# Patient Record
Sex: Female | Born: 1940 | Race: White | Hispanic: No | Marital: Married | Smoking: Former smoker
Health system: Southern US, Community
[De-identification: ages and names within clinical notes are randomized; demographics above are authoritative.]

## PROBLEM LIST (undated history)

## (undated) DIAGNOSIS — R51 Headache: Secondary | ICD-10-CM

## (undated) DIAGNOSIS — K52832 Lymphocytic colitis: Secondary | ICD-10-CM

## (undated) DIAGNOSIS — M549 Dorsalgia, unspecified: Secondary | ICD-10-CM

## (undated) DIAGNOSIS — I10 Essential (primary) hypertension: Secondary | ICD-10-CM

## (undated) DIAGNOSIS — R002 Palpitations: Secondary | ICD-10-CM

## (undated) DIAGNOSIS — J45909 Unspecified asthma, uncomplicated: Secondary | ICD-10-CM

## (undated) DIAGNOSIS — R413 Other amnesia: Secondary | ICD-10-CM

## (undated) DIAGNOSIS — K5790 Diverticulosis of intestine, part unspecified, without perforation or abscess without bleeding: Secondary | ICD-10-CM

## (undated) DIAGNOSIS — J189 Pneumonia, unspecified organism: Secondary | ICD-10-CM

## (undated) DIAGNOSIS — IMO0001 Reserved for inherently not codable concepts without codable children: Secondary | ICD-10-CM

## (undated) DIAGNOSIS — I639 Cerebral infarction, unspecified: Secondary | ICD-10-CM

## (undated) DIAGNOSIS — M8580 Other specified disorders of bone density and structure, unspecified site: Secondary | ICD-10-CM

## (undated) DIAGNOSIS — H353122 Nonexudative age-related macular degeneration, left eye, intermediate dry stage: Secondary | ICD-10-CM

## (undated) DIAGNOSIS — G459 Transient cerebral ischemic attack, unspecified: Secondary | ICD-10-CM

## (undated) DIAGNOSIS — N3090 Cystitis, unspecified without hematuria: Secondary | ICD-10-CM

## (undated) DIAGNOSIS — R0600 Dyspnea, unspecified: Secondary | ICD-10-CM

## (undated) DIAGNOSIS — C50919 Malignant neoplasm of unspecified site of unspecified female breast: Secondary | ICD-10-CM

## (undated) DIAGNOSIS — R011 Cardiac murmur, unspecified: Secondary | ICD-10-CM

## (undated) DIAGNOSIS — R896 Abnormal cytological findings in specimens from other organs, systems and tissues: Secondary | ICD-10-CM

## (undated) DIAGNOSIS — J449 Chronic obstructive pulmonary disease, unspecified: Secondary | ICD-10-CM

## (undated) DIAGNOSIS — Z86010 Personal history of colonic polyps: Secondary | ICD-10-CM

## (undated) DIAGNOSIS — N952 Postmenopausal atrophic vaginitis: Secondary | ICD-10-CM

## (undated) DIAGNOSIS — K219 Gastro-esophageal reflux disease without esophagitis: Secondary | ICD-10-CM

## (undated) DIAGNOSIS — M1990 Unspecified osteoarthritis, unspecified site: Secondary | ICD-10-CM

## (undated) DIAGNOSIS — Z853 Personal history of malignant neoplasm of breast: Secondary | ICD-10-CM

## (undated) HISTORY — PX: CARDIAC CATHETERIZATION: SHX172

## (undated) HISTORY — DX: Personal history of colonic polyps: Z86.010

## (undated) HISTORY — DX: Reserved for inherently not codable concepts without codable children: IMO0001

## (undated) HISTORY — PX: TONSILLECTOMY: SUR1361

## (undated) HISTORY — PX: FACIAL COSMETIC SURGERY: SHX629

## (undated) HISTORY — PX: CATARACT EXTRACTION: SUR2

## (undated) HISTORY — DX: Other specified disorders of bone density and structure, unspecified site: M85.80

## (undated) HISTORY — DX: Abnormal cytological findings in specimens from other organs, systems and tissues: R89.6

## (undated) HISTORY — DX: Palpitations: R00.2

## (undated) HISTORY — DX: Postmenopausal atrophic vaginitis: N95.2

## (undated) HISTORY — DX: Transient cerebral ischemic attack, unspecified: G45.9

## (undated) HISTORY — DX: Malignant neoplasm of unspecified site of unspecified female breast: C50.919

## (undated) HISTORY — DX: Unspecified osteoarthritis, unspecified site: M19.90

## (undated) HISTORY — PX: BREAST LUMPECTOMY: SHX2

## (undated) HISTORY — DX: Cystitis, unspecified without hematuria: N30.90

## (undated) HISTORY — DX: Diverticulosis of intestine, part unspecified, without perforation or abscess without bleeding: K57.90

## (undated) HISTORY — DX: Lymphocytic colitis: K52.832

## (undated) HISTORY — DX: Dorsalgia, unspecified: M54.9

## (undated) HISTORY — PX: EYE SURGERY: SHX253

## (undated) HISTORY — PX: COLPOSCOPY: SHX161

## (undated) HISTORY — DX: Headache: R51

## (undated) HISTORY — PX: BREAST BIOPSY: SHX20

## (undated) HISTORY — DX: Personal history of malignant neoplasm of breast: Z85.3

---

## 1990-02-19 DIAGNOSIS — C50919 Malignant neoplasm of unspecified site of unspecified female breast: Secondary | ICD-10-CM

## 1990-02-19 HISTORY — DX: Malignant neoplasm of unspecified site of unspecified female breast: C50.919

## 1991-02-20 HISTORY — PX: LYMPHADENECTOMY: SHX15

## 1997-10-21 ENCOUNTER — Other Ambulatory Visit: Admission: RE | Admit: 1997-10-21 | Discharge: 1997-10-21 | Payer: Self-pay | Admitting: Obstetrics and Gynecology

## 1998-11-22 ENCOUNTER — Other Ambulatory Visit: Admission: RE | Admit: 1998-11-22 | Discharge: 1998-11-22 | Payer: Self-pay | Admitting: Obstetrics and Gynecology

## 1999-01-25 ENCOUNTER — Emergency Department (HOSPITAL_COMMUNITY): Admission: EM | Admit: 1999-01-25 | Discharge: 1999-01-26 | Payer: Self-pay | Admitting: Emergency Medicine

## 1999-01-31 ENCOUNTER — Emergency Department (HOSPITAL_COMMUNITY): Admission: EM | Admit: 1999-01-31 | Discharge: 1999-01-31 | Payer: Self-pay | Admitting: Emergency Medicine

## 1999-11-30 ENCOUNTER — Other Ambulatory Visit: Admission: RE | Admit: 1999-11-30 | Discharge: 1999-11-30 | Payer: Self-pay | Admitting: Obstetrics and Gynecology

## 2000-12-18 ENCOUNTER — Other Ambulatory Visit: Admission: RE | Admit: 2000-12-18 | Discharge: 2000-12-18 | Payer: Self-pay | Admitting: Obstetrics and Gynecology

## 2002-01-19 ENCOUNTER — Other Ambulatory Visit: Admission: RE | Admit: 2002-01-19 | Discharge: 2002-01-19 | Payer: Self-pay | Admitting: Obstetrics and Gynecology

## 2002-05-27 ENCOUNTER — Encounter: Payer: Self-pay | Admitting: Family Medicine

## 2002-05-27 ENCOUNTER — Ambulatory Visit: Admission: RE | Admit: 2002-05-27 | Discharge: 2002-05-27 | Payer: Self-pay | Admitting: Family Medicine

## 2002-07-07 ENCOUNTER — Encounter: Payer: Self-pay | Admitting: Gastroenterology

## 2002-07-07 ENCOUNTER — Encounter (INDEPENDENT_AMBULATORY_CARE_PROVIDER_SITE_OTHER): Payer: Self-pay | Admitting: Specialist

## 2002-07-07 ENCOUNTER — Ambulatory Visit (HOSPITAL_COMMUNITY): Admission: RE | Admit: 2002-07-07 | Discharge: 2002-07-07 | Payer: Self-pay | Admitting: Gastroenterology

## 2002-07-07 DIAGNOSIS — Z860100 Personal history of colon polyps, unspecified: Secondary | ICD-10-CM

## 2002-07-07 HISTORY — DX: Personal history of colonic polyps: Z86.010

## 2002-07-07 HISTORY — DX: Personal history of colon polyps, unspecified: Z86.0100

## 2002-07-08 ENCOUNTER — Encounter: Payer: Self-pay | Admitting: Gastroenterology

## 2002-07-08 ENCOUNTER — Ambulatory Visit (HOSPITAL_COMMUNITY): Admission: RE | Admit: 2002-07-08 | Discharge: 2002-07-08 | Payer: Self-pay | Admitting: Gastroenterology

## 2003-03-17 ENCOUNTER — Other Ambulatory Visit: Admission: RE | Admit: 2003-03-17 | Discharge: 2003-03-17 | Payer: Self-pay | Admitting: Obstetrics and Gynecology

## 2003-06-01 ENCOUNTER — Ambulatory Visit (HOSPITAL_COMMUNITY): Admission: RE | Admit: 2003-06-01 | Discharge: 2003-06-01 | Payer: Self-pay | Admitting: Family Medicine

## 2004-05-23 ENCOUNTER — Other Ambulatory Visit: Admission: RE | Admit: 2004-05-23 | Discharge: 2004-05-23 | Payer: Self-pay | Admitting: Addiction Medicine

## 2004-06-21 ENCOUNTER — Inpatient Hospital Stay (HOSPITAL_BASED_OUTPATIENT_CLINIC_OR_DEPARTMENT_OTHER): Admission: RE | Admit: 2004-06-21 | Discharge: 2004-06-21 | Payer: Self-pay | Admitting: Cardiology

## 2005-05-29 ENCOUNTER — Other Ambulatory Visit: Admission: RE | Admit: 2005-05-29 | Discharge: 2005-05-29 | Payer: Self-pay | Admitting: Obstetrics and Gynecology

## 2005-09-21 ENCOUNTER — Other Ambulatory Visit: Admission: RE | Admit: 2005-09-21 | Discharge: 2005-09-21 | Payer: Self-pay | Admitting: Obstetrics and Gynecology

## 2006-01-22 ENCOUNTER — Other Ambulatory Visit: Admission: RE | Admit: 2006-01-22 | Discharge: 2006-01-22 | Payer: Self-pay | Admitting: Obstetrics and Gynecology

## 2006-06-03 ENCOUNTER — Other Ambulatory Visit: Admission: RE | Admit: 2006-06-03 | Discharge: 2006-06-03 | Payer: Self-pay | Admitting: Obstetrics and Gynecology

## 2006-11-26 ENCOUNTER — Other Ambulatory Visit: Admission: RE | Admit: 2006-11-26 | Discharge: 2006-11-26 | Payer: Self-pay | Admitting: Obstetrics and Gynecology

## 2007-06-27 ENCOUNTER — Other Ambulatory Visit: Admission: RE | Admit: 2007-06-27 | Discharge: 2007-06-27 | Payer: Self-pay | Admitting: Obstetrics and Gynecology

## 2007-12-13 ENCOUNTER — Encounter: Admission: RE | Admit: 2007-12-13 | Discharge: 2007-12-13 | Payer: Self-pay | Admitting: Neurology

## 2008-02-20 DIAGNOSIS — I639 Cerebral infarction, unspecified: Secondary | ICD-10-CM

## 2008-02-20 HISTORY — DX: Cerebral infarction, unspecified: I63.9

## 2008-06-15 ENCOUNTER — Ambulatory Visit (HOSPITAL_COMMUNITY): Admission: RE | Admit: 2008-06-15 | Discharge: 2008-06-15 | Payer: Self-pay | Admitting: Neurology

## 2008-07-02 ENCOUNTER — Emergency Department (HOSPITAL_COMMUNITY): Admission: EM | Admit: 2008-07-02 | Discharge: 2008-07-02 | Payer: Self-pay | Admitting: Emergency Medicine

## 2008-07-13 ENCOUNTER — Ambulatory Visit (HOSPITAL_COMMUNITY): Admission: RE | Admit: 2008-07-13 | Discharge: 2008-07-13 | Payer: Self-pay | Admitting: Cardiology

## 2008-07-20 ENCOUNTER — Ambulatory Visit: Payer: Self-pay | Admitting: Cardiovascular Disease

## 2008-07-20 ENCOUNTER — Ambulatory Visit (HOSPITAL_COMMUNITY): Admission: RE | Admit: 2008-07-20 | Discharge: 2008-07-20 | Payer: Self-pay | Admitting: Cardiology

## 2008-07-23 ENCOUNTER — Encounter: Payer: Self-pay | Admitting: Cardiology

## 2008-08-03 ENCOUNTER — Ambulatory Visit: Payer: Self-pay | Admitting: Surgery

## 2008-08-03 ENCOUNTER — Ambulatory Visit: Admission: RE | Admit: 2008-08-03 | Discharge: 2008-08-03 | Payer: Self-pay | Admitting: Neurology

## 2008-08-03 ENCOUNTER — Encounter (INDEPENDENT_AMBULATORY_CARE_PROVIDER_SITE_OTHER): Payer: Self-pay | Admitting: Neurology

## 2008-08-17 ENCOUNTER — Encounter: Payer: Self-pay | Admitting: Obstetrics and Gynecology

## 2008-08-17 ENCOUNTER — Ambulatory Visit: Payer: Self-pay | Admitting: Obstetrics and Gynecology

## 2009-02-19 HISTORY — PX: COLONOSCOPY: SHX174

## 2009-08-24 ENCOUNTER — Other Ambulatory Visit: Admission: RE | Admit: 2009-08-24 | Discharge: 2009-08-24 | Payer: Self-pay | Admitting: Obstetrics and Gynecology

## 2009-08-24 ENCOUNTER — Ambulatory Visit: Payer: Self-pay | Admitting: Obstetrics and Gynecology

## 2009-09-20 ENCOUNTER — Ambulatory Visit: Payer: Self-pay | Admitting: Cardiology

## 2009-10-05 ENCOUNTER — Encounter: Payer: Self-pay | Admitting: Gastroenterology

## 2009-10-12 ENCOUNTER — Encounter: Payer: Self-pay | Admitting: Gastroenterology

## 2009-10-14 ENCOUNTER — Encounter: Payer: Self-pay | Admitting: Gastroenterology

## 2009-10-26 ENCOUNTER — Telehealth: Payer: Self-pay | Admitting: Gastroenterology

## 2009-10-28 DIAGNOSIS — K5900 Constipation, unspecified: Secondary | ICD-10-CM | POA: Insufficient documentation

## 2009-10-28 DIAGNOSIS — R51 Headache: Secondary | ICD-10-CM

## 2009-10-28 DIAGNOSIS — Z853 Personal history of malignant neoplasm of breast: Secondary | ICD-10-CM

## 2009-10-28 DIAGNOSIS — M899 Disorder of bone, unspecified: Secondary | ICD-10-CM | POA: Insufficient documentation

## 2009-10-28 DIAGNOSIS — G459 Transient cerebral ischemic attack, unspecified: Secondary | ICD-10-CM | POA: Insufficient documentation

## 2009-10-28 DIAGNOSIS — Z860100 Personal history of colon polyps, unspecified: Secondary | ICD-10-CM | POA: Insufficient documentation

## 2009-10-28 DIAGNOSIS — N3090 Cystitis, unspecified without hematuria: Secondary | ICD-10-CM | POA: Insufficient documentation

## 2009-10-28 DIAGNOSIS — M1990 Unspecified osteoarthritis, unspecified site: Secondary | ICD-10-CM | POA: Insufficient documentation

## 2009-10-28 DIAGNOSIS — M549 Dorsalgia, unspecified: Secondary | ICD-10-CM | POA: Insufficient documentation

## 2009-10-28 DIAGNOSIS — R1319 Other dysphagia: Secondary | ICD-10-CM

## 2009-10-28 DIAGNOSIS — R002 Palpitations: Secondary | ICD-10-CM

## 2009-10-28 DIAGNOSIS — M949 Disorder of cartilage, unspecified: Secondary | ICD-10-CM

## 2009-10-28 DIAGNOSIS — R519 Headache, unspecified: Secondary | ICD-10-CM | POA: Insufficient documentation

## 2009-11-01 ENCOUNTER — Ambulatory Visit: Payer: Self-pay | Admitting: Gastroenterology

## 2009-11-01 DIAGNOSIS — R197 Diarrhea, unspecified: Secondary | ICD-10-CM

## 2009-11-01 LAB — CONVERTED CEMR LAB
ALT: 26 units/L (ref 0–35)
AST: 27 units/L (ref 0–37)
Albumin: 4.4 g/dL (ref 3.5–5.2)
BUN: 12 mg/dL (ref 6–23)
Basophils Relative: 0.9 % (ref 0.0–3.0)
CO2: 30 meq/L (ref 19–32)
Chloride: 103 meq/L (ref 96–112)
Creatinine, Ser: 0.6 mg/dL (ref 0.4–1.2)
Eosinophils Absolute: 0.1 10*3/uL (ref 0.0–0.7)
Eosinophils Relative: 1.7 % (ref 0.0–5.0)
Glucose, Bld: 76 mg/dL (ref 70–99)
Hemoglobin: 14.3 g/dL (ref 12.0–15.0)
Lymphocytes Relative: 24.9 % (ref 12.0–46.0)
MCHC: 33.9 g/dL (ref 30.0–36.0)
Neutro Abs: 3.2 10*3/uL (ref 1.4–7.7)
RBC: 4.31 M/uL (ref 3.87–5.11)
Sed Rate: 13 mm/hr (ref 0–22)
TSH: 0.57 microintl units/mL (ref 0.35–5.50)
Total Protein: 6.9 g/dL (ref 6.0–8.3)
Vitamin B-12: 544 pg/mL (ref 211–911)
WBC: 5.3 10*3/uL (ref 4.5–10.5)

## 2009-11-03 ENCOUNTER — Encounter: Payer: Self-pay | Admitting: Gastroenterology

## 2009-11-11 ENCOUNTER — Telehealth: Payer: Self-pay | Admitting: Gastroenterology

## 2009-11-15 ENCOUNTER — Telehealth: Payer: Self-pay | Admitting: Gastroenterology

## 2009-11-15 ENCOUNTER — Encounter (INDEPENDENT_AMBULATORY_CARE_PROVIDER_SITE_OTHER): Payer: Self-pay | Admitting: *Deleted

## 2009-11-18 ENCOUNTER — Encounter (INDEPENDENT_AMBULATORY_CARE_PROVIDER_SITE_OTHER): Payer: Self-pay | Admitting: *Deleted

## 2009-11-22 ENCOUNTER — Ambulatory Visit: Payer: Self-pay | Admitting: Gastroenterology

## 2009-11-22 ENCOUNTER — Telehealth: Payer: Self-pay | Admitting: Gastroenterology

## 2009-12-05 ENCOUNTER — Ambulatory Visit: Payer: Self-pay | Admitting: Gastroenterology

## 2009-12-06 ENCOUNTER — Encounter: Payer: Self-pay | Admitting: Gastroenterology

## 2010-01-05 DIAGNOSIS — K5289 Other specified noninfective gastroenteritis and colitis: Secondary | ICD-10-CM

## 2010-01-10 ENCOUNTER — Ambulatory Visit: Payer: Self-pay | Admitting: Gastroenterology

## 2010-02-19 DIAGNOSIS — M8580 Other specified disorders of bone density and structure, unspecified site: Secondary | ICD-10-CM

## 2010-02-19 HISTORY — DX: Other specified disorders of bone density and structure, unspecified site: M85.80

## 2010-02-21 ENCOUNTER — Telehealth: Payer: Self-pay | Admitting: Gastroenterology

## 2010-02-24 ENCOUNTER — Telehealth: Payer: Self-pay | Admitting: Gastroenterology

## 2010-03-23 NOTE — Progress Notes (Signed)
Summary: MD Switch Request  Phone Note From Other Clinic Call back at [phone]   Caller: Gloria--Dr Perini Call For: Dr Arlyce Dice Reason for Call: Schedule Patient Appt Summary of Call: Patient wants to switch from Dr Arlyce Dice to Dr Jarold Motto because her husband is a pt of Dr Jarold Motto and she does not want to see a PA pt wants to see a doctor. Initial call taken by: Tawni Levy,  October 26, 2009 9:29 AM  Follow-up for Phone Call        DR.KAPLAN-Do you approve switch? Laureen Ochs LPN  October 26, 2009 9:44 AM   Additional Follow-up for Phone Call Additional follow up Details #1::        ok Additional Follow-up by: Louis Meckel MD,  October 26, 2009 11:06 AM    Additional Follow-up for Phone Call Additional follow up Details #2::    DR.PATTERSON--Will you accept this patient? Laureen Ochs LPN  October 26, 2009 11:10 AM   Additional Follow-up for Phone Call Additional follow up Details #3:: Details for Additional Follow-up Action Taken: NOT APPROPRIATE PER OUR RECENT GUIDELINES... Additional Follow-up by: [name] MD Clementeen Graham,  October 26, 2009 11:39 AM   Appended Document: MD Switch Request Justice Rocher that Dr.Patterson declines pt., she may see Amy Esterwood PAC this week or see Dr.Kaplan 11-14-09 at 11:15am. Will wait for [facility] or pt. to callback to confirm an appointment.    Appended Document: MD Switch Request per Dr. Jarold Motto ok to change...patient is wife to Dr. Sharia Reeve.  Appended Document: MD Switch Request Pt. has an appt. w/Dr.Patterson on 11-01-09 at 10:45am.

## 2010-03-23 NOTE — Miscellaneous (Signed)
Summary: [facility]  Clinical Lists Changes  Medications: Added new medication of MOVIPREP 100 GM  SOLR (PEG-KCL-NACL-NASULF-NA ASC-C) As per prep instructions. - Signed Rx of MOVIPREP 100 GM  SOLR (PEG-KCL-NACL-NASULF-NA ASC-C) As per prep instructions.;  #1 x 0;  Signed;  Entered by: Ezra Sites RN;  Authorized by: [name] MD [facility];  Method used: Electronically to [facility] [id number]*, [address], [place], [place], [place]  [address], Ph: [phone] or [phone], Fax: [phone] Observations: Added new observation of ALLERGY REV: Done (11/22/2009 14:35)    Prescriptions: MOVIPREP 100 GM  SOLR (PEG-KCL-NACL-NASULF-NA ASC-C) As per prep instructions.  #1 x 0   Entered by:   Ezra Sites RN   Authorized by:   [name] MD [facility]   Signed by:   Ezra Sites RN on 11/22/2009   Method used:   Electronically to        [facility] [address] [phone]* (retail)       [address]       [place], [place]  [address]       Ph: [phone] or [phone]       Fax: [phone]   RxID:   [id number]

## 2010-03-23 NOTE — Procedures (Signed)
Summary: COLONOSCOPY   Colonoscopy  Procedure date:  08/07/2002  Findings:      Location:  [facility].     Patient Name: Anna York, Anna York MRN: [id number] Procedure Procedures: Colonoscopy CPT: 40981.    with Hot Biopsy(s)CPT: Z451292.  Personnel: Endoscopist: Barbette Hair. Arlyce Dice, MD.  Indications Symptoms: Change in bowel habits.  History  Pre-Exam Physical: Performed Jul 07, 2002. Entire physical exam was normal.  Exam Exam: Extent of exam reached: Cecum, extent intended: Cecum.  The cecum was identified by IC valve. Colon retroflexion performed. ASA Classification: I. Tolerance: good.  Monitoring: Pulse and BP monitoring, Oximetry used. Supplemental O2 given. at 2 Liters.  Colon Prep Used Golytely for colon prep. Prep results: excellent.  Sedation Meds: Fentanyl 100 mcg. given IV. Versed 10 mg. given IV.  Findings POLYP: Sigmoid Colon, Maximum size: 2 mm. Distance from Anus 20 cm. Procedure:  hot biopsy, Polyp sent to pathology. ICD9: Colon Polyps: 211.3.  POLYP: Sigmoid Colon, Maximum size: 2 mm. Distance from Anus 18 cm. Procedure:  hot biopsy, sent to pathology. ICD9: Colon Polyps: 211.3.   Assessment Abnormal examination, see findings above.  Diagnoses: 211.3: Colon Polyps.   Events  Unplanned Interventions: No intervention was required.  Unplanned Events: There were no complications. Plans Medication Plan: Fiber supplements: Bran 1 Tbsp QD, starting Jul 07, 2002   Patient Education: Patient given standard instructions for: Polyps.  Scheduling/Referral: Office Visit, to [employer]. Arlyce Dice, MD, around Jul 28, 2002.  CT Scan, Abd and pelvis, around Jul 08, 2002.  Blood Tests, CBC, CMET, on Jul 07, 2002.    CC: [name]  This report was created from the original endoscopy report, which was reviewed and signed by the above listed endoscopist.

## 2010-03-23 NOTE — Letter (Signed)
Summary: [facility]   Imported By: Sherian Rein 11/03/2009 14:58:22  _____________________________________________________________________  External Attachment:    Type:   Image     Comment:   External Document

## 2010-03-23 NOTE — Letter (Signed)
Summary: [facility] Instructions  [facility]  [address] [place], [place] [address]   Phone: [phone]  Fax: [phone]       Anna York    05-Feb-1941    MRN: [id number]        Procedure Day /Date:  Monday 12/19/2009     Arrival Time: 12:30 pm      Procedure Time: 1:30 pm     Location of Procedure:                    _ x_  [facility] (4th Floor)                        PREPARATION FOR COLONOSCOPY WITH MOVIPREP   Starting 5 days prior to your procedure 10/26 Wednesday do not eat nuts, seeds, popcorn, corn, beans, peas,  salads, or any raw vegetables.  Do not take any fiber supplements (e.g. Metamucil, Citrucel, and Benefiber).  THE DAY BEFORE YOUR PROCEDURE         DATE: Sunday 10/30  1.  Drink clear liquids the entire day-NO SOLID FOOD  2.  Do not drink anything colored red or purple.  Avoid juices with pulp.  No orange juice.  3.  Drink at least 64 oz. (8 glasses) of fluid/clear liquids during the day to prevent dehydration and help the prep work efficiently.  CLEAR LIQUIDS INCLUDE: Water Jello Ice Popsicles Tea (sugar ok, no milk/cream) Powdered fruit flavored drinks Coffee (sugar ok, no milk/cream) Gatorade Juice: apple, white grape, white cranberry  Lemonade Clear bullion, consomm, broth Carbonated beverages (any kind) Strained chicken noodle soup Hard Candy                             4.  In the morning, mix first dose of MoviPrep solution:    Empty 1 Pouch A and 1 Pouch B into the disposable container    Add lukewarm drinking water to the top line of the container. Mix to dissolve    Refrigerate (mixed solution should be used within 24 hrs)  5.  Begin drinking the prep at 5:00 p.m. The MoviPrep container is divided by 4 marks.   Every 15 minutes drink the solution down to the next mark (approximately 8 oz) until the full liter is complete.   6.  Follow completed prep with 16 oz of clear liquid of your choice  (Nothing red or purple).  Continue to drink clear liquids until bedtime.  7.  Before going to bed, mix second dose of MoviPrep solution:    Empty 1 Pouch A and 1 Pouch B into the disposable container    Add lukewarm drinking water to the top line of the container. Mix to dissolve    Refrigerate  THE DAY OF YOUR PROCEDURE      DATE: Monday 10/31  Beginning at 8:30 a.m. (5 hours before procedure):         1. Every 15 minutes, drink the solution down to the next mark (approx 8 oz) until the full liter is complete.  2. Follow completed prep with 16 oz. of clear liquid of your choice.    3. You may drink clear liquids until 11:30 am (2 HOURS BEFORE PROCEDURE).   MEDICATION INSTRUCTIONS  Unless otherwise instructed, you should take regular prescription medications with a small sip of water   as early as possible the morning of  your procedure.         OTHER INSTRUCTIONS  You will need a responsible adult at least 69 years of age to accompany you and drive you home.   This person must remain in the waiting room during your procedure.  Wear loose fitting clothing that is easily removed.  Leave jewelry and other valuables at home.  However, you may wish to bring a book to read or  an iPod/MP3 player to listen to music as you wait for your procedure to start.  Remove all body piercing jewelry and leave at home.  Total time from sign-in until discharge is approximately 2-3 hours.  You should go home directly after your procedure and rest.  You can resume normal activities the  day after your procedure.  The day of your procedure you should not:   Drive   Make legal decisions   Operate machinery   Drink alcohol   Return to work  You will receive specific instructions about eating, activities and medications before you leave.    The above instructions have been reviewed and explained to me by   Ezra Sites RN  November 22, 2009 3:13 PM    I fully understand and  can verbalize these instructions _____________________________ Date _________

## 2010-03-23 NOTE — Progress Notes (Signed)
Summary: Diarrhea  Phone Note Call from Patient   Call For: Dr Jarold Motto Reason for Call: Talk to Nurse Summary of Call: Wants to talk to triage nurse about Diarrhea she has been having for weeks. Initial call taken by: Leanor Kail [facility],  November 22, 2009 3:28 PM  Follow-up for Phone Call        librax is not helping with diarrhea , but is helping with the pain.  She would like to move her colon up if possible.  Patient  is rescheduled to 12/05/09 9:00. Follow-up by: Darcey Nora RN, CGRN,  November 22, 2009 3:55 PM

## 2010-03-23 NOTE — Assessment & Plan Note (Signed)
Summary: lower abdominal pain X 1 month/lk     (MD switch approved 9-7...   History of Present Illness Visit Type: consult Primary GI MD: Sheryn Bison MD FACP FAGA Requesting Provider: Rodrigo Ran, MD Chief Complaint: lower abdominal pain that will radiate toward chest History of Present Illness:   Very Pleasant 70 year old Caucasian female wife of Dr. Garrison Columbus. She is seen today because of a 3 week history of crampy lower abdominal pain with small volume nonbloody diarrhea. She denies systemic complaints, fever, chills, upper gastrointestinal, or hepatobiliary problems. She has not had stool examinations performed.  Her past medical history is remarkable for constipation predominant IBS and she had a negative colonoscopy in 2004 with Dr. Melvia Heaps. She has had previous breast cancer with lumpectomy, chemotherapy and radiation therapy but no prior abdominal surgery. She did have CT scan of her abdomen in 2004 which was unremarkable except for small left lung nodule that apparently was followed by Dr. Durwin Nora.  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 Her husband is not sick, and she's had no known infectious disease exposure. She does have degenerative arthritis and takes Celebrex and p.r.n. NSAIDs. She well Levaquin for cystitis in the end of June. She apparently has not been on other antibiotics.   GI Review of Systems    Reports abdominal pain and  bloating.      Denies acid reflux, belching, chest pain, dysphagia with liquids, dysphagia with solids, heartburn, loss of appetite, nausea, vomiting, vomiting blood, weight loss, and  weight gain.      Reports diarrhea.     Denies anal fissure, black tarry stools, change in bowel habit, constipation, diverticulosis, fecal incontinence, heme positive stool, hemorrhoids, irritable bowel syndrome, jaundice, light color stool, liver problems, rectal bleeding, and  rectal pain.     Current Medications (verified): 1)  Fosamax 70 Mg Tabs (Alendronate Sodium) .... Take One By Mouth Once A Week 2)  Toprol Xl 25 Mg Xr24h-Tab (Metoprolol Succinate) .... Take 1/2 Tablet By Mouth Once Daily 3)  Plavix 75 Mg Tabs (Clopidogrel Bisulfate) .... Take One By Mouth Once Daily 4)  Replens  Gel (Vaginal Lubricant) .... Use As Directed 5)  Celebrex 200 Mg Caps (Celecoxib) .... Take One By Mouth Once Daily 6)  Womens Multivitamin Plus  Tabs (Multiple Vitamins-Minerals) .... Take One By Mouth Once Daily 7)  Prevacid 30 Mg Cpdr (Lansoprazole) .... Take One Cap By Mouth As Needed With Celebrex  Allergies (verified): 1)  ! Macrodantin 2)  ! Erythromycin  Past History:  Past medical, surgical, family and social histories (including risk factors) reviewed for relevance to current acute and chronic problems.  Past Medical History: Reviewed history from 10/28/2009 and no changes required. Current Problems:  OTHER DYSPHAGIA (ICD-787.29) TIA (ICD-435.9) PALPITATIONS (ICD-785.1) OSTEOARTHRITIS (ICD-715.90) CYSTITIS (ICD-595.9) OSTEOPENIA (ICD-733.90) HEADACHE (ICD-784.0) BACK PAIN (ICD-724.5) COLONIC POLYPS, HYPERPLASTIC, HX OF (ICD-V12.72) NEOPLASM, MALIGNANT, BREAST, HX OF (ICD-V10.3) CONSTIPATION (ICD-564.00)  Past Surgical History: lumpectomy, chemo and radiation Tonsillectomy  Family History: Reviewed history from 10/28/2009 and no changes required. No FH of Colon Cancer: Family History of Breast Cancer: Mother- dx'd 73 yo  Social History: Reviewed history from 10/28/2009 and no changes required. Occupation: Veterinary surgeon at [employer]  Married to Dr. Sharia Reeve Alcohol Use - yes 4 glasses per day Illicit Drug Use - no Patient is a former smoker.  Daily Caffeine Use 2 per day Patient gets regular exercise. 3 times a week  Review of Systems       The patient complains of arthritis/joint pain and fatigue.  The patient denies allergy/sinus, anemia,  anxiety-new, back pain, blood in urine, breast changes/lumps, confusion, cough, coughing up blood, depression-new, fainting, fever, headaches-new, hearing problems, heart murmur, heart rhythm changes, itching, menstrual pain, muscle pains/cramps, night sweats, nosebleeds, pregnancy symptoms, shortness of breath, skin rash, sleeping problems, sore throat, swelling of feet/legs, swollen lymph glands, thirst - excessive, urination - excessive, urination changes/pain, urine leakage, vision changes, and voice change.    Vital Signs:  Patient profile:   70 year old female Height:      65 inches Weight:      128 pounds BMI:     21.38 Pulse rate:   76 / minute Pulse rhythm:   regular BP sitting:   110 / 74  (left arm) Cuff size:   regular  Vitals Entered By: Francee Piccolo CMA Duncan Dull) (November 01, 2009 10:51 AM)  Physical Exam  General:  Well developed, well nourished, no acute distress.healthy appearing.   Head:  Normocephalic and atraumatic. Eyes:  PERRLA, no icterus.exam deferred to patient's ophthalmologist.   Lungs:  decreased BS on L and decreased BS on R.   Heart:  Regular rate and rhythm; no murmurs, rubs,  or bruits. Abdomen:  Soft, nontender and nondistended. No masses, hepatosplenomegaly or hernias noted. Normal bowel sounds. Rectal:  Normal exam.hemocult negative.   Msk:  arthritic changes.   Extremities:  No clubbing, cyanosis, edema or deformities noted. Neurologic:  Alert and  oriented x4;  grossly normal neurologically. Cervical Nodes:  No significant cervical adenopathy. Psych:  Alert and cooperative. Normal mood and affect.   Impression & Recommendations:  Problem # 1:  DIARRHEA (ICD-787.91) Assessment Unchanged Probable antibiotic associated diarrhea and mild C. difficile colitis. I have requested stool exams and screening labs. We have started fidaxomicin 200mg  two times a day for 10 days and daily Florstar probiotic therapy. She is to call in one week's time for  progress report. The patient did not want a prescription for metronidazole because of continued EtOH use. She may need followup colonoscopy exam depending on her workup and clinical course.Review of her records shows a prescription for metronidazole 500 mg t.i.d. by Dr. Kennedy Bucker in August, but I am not sure this patient took this prescription. Orders: TLB-CBC Platelet - w/Differential (85025-CBCD) TLB-BMP (Basic Metabolic Panel-BMET) (80048-METABOL) TLB-Hepatic/Liver Function Pnl (80076-HEPATIC) TLB-TSH (Thyroid Stimulating Hormone) (84443-TSH) TLB-B12, Serum-Total ONLY (09811-B14) TLB-Ferritin (82728-FER) TLB-Folic Acid (Folate) (82746-FOL) TLB-IBC Pnl (Iron/FE;Transferrin) (83550-IBC) TLB-Sedimentation Rate (ESR) (85652-ESR) T-Culture, C-Diff Toxin A/B (78295-62130) T-Culture, Stool (87045/87046-70140) T-Stool for O&P (86578-46962) T-Fecal WBC (95284-13244)  Problem # 2:  PALPITATIONS (ICD-785.1) Assessment: Improved Continue other medications per Dr. Waynard Edwards...  Patient Instructions: 1)  Copy sent to : Rodrigo Ran, MD 2)  Your prescription(s) have been sent to you pharmacy.  3)  Please go to the basement today for your labs.  4)  The medication list was reviewed and reconciled.  All changed / newly prescribed medications were explained.  A complete medication list was provided to the patient / caregiver. 5)  Please call our GI Office back in 2 weeks with a follow-up of symptoms. 6)  Advised to stick with a low residue diet  avoiding food that can irritate bowel (see handout).  7)  Please return stool exams as requested. 8)  Avoidance of sorbitol/fructose. Prescriptions: FLORASTOR 250 MG CAPS (SACCHAROMYCES BOULARDII) take one by mouth once daily for 10days  #10 x 0  Entered by:   [name] CMA (AAMA)   Authorized by:   [name] MD [facility]   Signed by:   [name] CMA (AAMA) on 11/01/2009   Method used:   Electronically to        [facility] [address] [phone]*  (retail)       [address]       [place], [place]  [address]       Ph: [phone] or [phone]       Fax: [phone]   RxID:   [id number] DIFICID 200 MG TABS (FIDAXOMICIN) take one by mouth two times a day for 10 days  #20 x 0   Entered by:   [name] CMA (AAMA)   Authorized by:   [name] MD [facility]   Signed by:   [name] CMA (AAMA) on 11/01/2009   Method used:   Electronically to        [facility] [address] [phone]* (retail)       [address]       [place], [place]  [address]       Ph: [phone] or [phone]       Fax: [phone]   RxID:   [id number]   Appended Document: lower abdominal pain X 1 month/lk     (MD switch approved 9-7... Left a message on patients machine to call back if she has questions but her rx has been changed and sent.   Clinical Lists Changes  Medications: Changed medication from PREVACID 30 MG CPDR (LANSOPRAZOLE) take one cap by mouth as needed with Celebrex to OMEPRAZOLE 20 MG CPDR (OMEPRAZOLE) take one by mouth once daily - Signed Rx of OMEPRAZOLE 20 MG CPDR (OMEPRAZOLE) take one by mouth once daily;  #30 x 11;  Signed;  Entered by: [name] CMA (AAMA);  Authorized by: [name] MD [facility];  Method used: Electronically to [facility] [id number]*, [address], [place], [place], [place]  [address], Ph: [phone] or [phone], Fax: [phone]    Prescriptions: OMEPRAZOLE 20 MG CPDR (OMEPRAZOLE) take one by mouth once daily  #30 x 11   Entered by:   [name] CMA (AAMA)   Authorized by:   [name] MD [facility]   Signed by:   [name] CMA (AAMA) on 11/01/2009   Method used:   Electronically to        [facility] [address] [phone]* (retail)       [address]       [place], [place]  [address]       Ph: [phone] or [phone]       Fax: [phone]   RxID:    [id number]    Appended Document: lower abdominal pain X 1 month/lk     (MD switch approved 9-7... per Dr. Jarold Motto we will hold the vanco until after stool cultures are back.    Clinical Lists Changes  Medications: Changed medication from DIFICID 200 MG TABS (FIDAXOMICIN) take one by mouth two times a day for 10 days to [facility] HCL 125 MG CAPS (VANCOMYCIN HCL) take one by mouth three times a day for 10 days - Signed Rx of VANCOCIN HCL 125 MG CAPS (VANCOMYCIN HCL) take one by mouth three times a day  for 10 days;  #30 x 0;  Signed;  Entered by: [name] CMA (AAMA);  Authorized by: [name] MD [facility];  Method used: Telephoned to [facility] [id number]*, [address] [place], [place], [place]  [address], Ph: [phone] or [phone], Fax: [phone]    Prescriptions: VANCOCIN HCL 125 MG CAPS (VANCOMYCIN HCL) take one by mouth three times a day for 10 days  #30 x 0   Entered by:   [name] CMA (AAMA)   Authorized by:   [name] MD [facility]   Signed by:   [name] CMA (AAMA) on 11/01/2009   Method used:   Telephoned to ...       [facility]  [address] [phone]* (retail)       [address]       [place], [place]  [address]       Ph: [phone] or [phone]       Fax: [phone]   RxID:   [id number]    Appended Document: lower abdominal pain X 1 month/lk     (MD switch approved 9-7... please call...stop prevacid and use librax q8h as needed...  Appended Document: lower abdominal pain X 1 month/lk     (MD switch approved 9-7... Left a message on patients machine to call back. patietn has already been changed from prevacid to omeprazole per Dr Jarold Motto.   Appended Document: lower abdominal pain X 1 month/lk     (MD switch approved 9-7... pt aware, rx sent   Clinical Lists Changes  Medications: Removed medication of VANCOCIN HCL 125 MG CAPS (VANCOMYCIN HCL) take one by mouth three times a  day for 10 days Removed medication of FLORASTOR 250 MG CAPS (SACCHAROMYCES BOULARDII) take one by mouth once daily for 10days Added new medication of LIBRAX 2.5-5 MG CAPS (CLIDINIUM-CHLORDIAZEPOXIDE) take one by mouth every 8 hours as needed - Signed Rx of LIBRAX 2.5-5 MG CAPS (CLIDINIUM-CHLORDIAZEPOXIDE) take one by mouth every 8 hours as needed;  #90 x 2;  Signed;  Entered by: [name] CMA (AAMA);  Authorized by: [name] MD [facility];  Method used: Electronically to [facility] [id number]*, [address] [place], [place], [place]  [address], Ph: [phone] or [phone], Fax: [phone]    Prescriptions: LIBRAX 2.5-5 MG CAPS (CLIDINIUM-CHLORDIAZEPOXIDE) take one by mouth every 8 hours as needed  #90 x 2   Entered by:   [name] CMA (AAMA)   Authorized by:   [name] MD [facility]   Signed by:   [name] CMA (AAMA) on 11/03/2009   Method used:   Electronically to        [facility] [address] [phone]* (retail)       [address]       [place], [place]  [address]       Ph: [phone] or [phone]       Fax: [phone]   RxID:   [id number]

## 2010-03-23 NOTE — Progress Notes (Signed)
Summary: Triage-Sooner Appt.  Phone Note From Other Clinic Call back at [phone]   Caller: Malachi Bonds, scheduler Call For: Dr. Arlyce Dice Reason for Call: Schedule Patient Appt Summary of Call: Dr. Waynard Edwards would like pt sch'ed asap for severe diarrhea Initial call taken by: Vallarie Mare,  October 26, 2009 8:58 AM  Follow-up for Phone Call        Pt. will see [facility] on 10-27-09 at 1:30pm. Malachi Bonds will advise pt. of appt/med.list/co-pay/cx.policy. She will fax records to [facility].  Follow-up by: Laureen Ochs LPN,  October 26, 2009 9:17 AM

## 2010-03-23 NOTE — Letter (Signed)
Summary: [facility]   Imported By: Sherian Rein 11/03/2009 11:07:25  _____________________________________________________________________  External Attachment:    Type:   Image     Comment:   External Document

## 2010-03-23 NOTE — Letter (Signed)
Summary: Patient Notice- Colon Biospy Results  [facility]  [address] [place], [place] [address]   Phone: [phone]  Fax: [phone]        December 06, 2009 MRN: [id number]    Anna York [address] [place], [place]  [address]    Dear [name],  I am pleased to inform you that the biopsies taken during your recent colonoscopy did not show any evidence of cancer upon pathologic examination.  Additional information/recommendations:  __No further action is needed at this time.  Please follow-up with      your primary care physician for your other healthcare needs.  __Please call [phone] to schedule a return visit to review      your condition.  x__Continue with the treatment plan as outlined on the day of your      exam.Biopsies confirm lymphocytic colitis which would explain all of your diarrhea problems. We will start Entocort 9 mg a day. This is a topical steroid preparation which has excellent efficacy in this condition. Also my nurse will schedule you for office followup in one month's time. I hope you are feeling better soon.  __You should have a repeat colonoscopy examination for this problem           in _ years.  Please call us if you are having persistent problems or have questions about your condition that have not been fully answered at this time.  Sincerely,  [name] MD [facility]   This letter has been electronically signed by your physician.  Appended Document: Patient Notice- Colon Biospy Results letter mailed

## 2010-03-23 NOTE — Progress Notes (Signed)
Summary: speak to nurse  Phone Note Call from Patient Call back at Home Phone [phone] Call back at cell [phone]   Caller: Patient Call For: Dr Jarold Motto Reason for Call: Talk to Nurse Summary of Call: Patient was told to call back in six weeks to f/u up on meds Initial call taken by: Tawni Levy,  February 21, 2010 4:14 PM  Follow-up for Phone Call        Left a message on patients machine to call back. [name] CMA Duncan Dull)  February 21, 2010 5:14 PM  patient states sh feels about the same since starting the Lialda. She has a little less diarrhea but is still having some urgency. She is having some abdominal cramping at night still. She is still using a half of Imoddium as needed. no bleeding and she is no worse. She states that the Entocort helped her a little more but not enough to want to change back also it is to expensive.  Follow-up by: [name] CMA Duncan Dull),  February 22, 2010 9:16 AM    Additional Follow-up for Phone Call Additional follow up Details #2::    ok.... Follow-up by: [name] MD [facility],  February 22, 2010 1:03 PM

## 2010-03-23 NOTE — Progress Notes (Signed)
Summary: Schedule Colonoscopy  Phone Note Outgoing Call Call back at [facility] Phone [phone]   Call placed by: [name] CMA Duncan Dull),  November 15, 2009 3:00 PM Call placed to: Patient Summary of Call: per Dr Jarold Motto patient needs set up for a direct colonoscopy. I have left a message with her husband that she can call back to schedule it when the time is good for her. He will have her call back, i gave him the number.  Initial call taken by: [name] CMA Duncan Dull),  November 15, 2009 3:01 PM     Appended Document: Schedule Colonoscopy Per Dr. Jarold Motto patient can continue her plavix she does not need to stop for her colonoscopy  Appended Document: Schedule Colonoscopy pt. returned call and sch'd direct COL on 12-19-09 @ 1:30

## 2010-03-23 NOTE — Assessment & Plan Note (Signed)
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 She was placed on Entocort 9 mg a day which she took for several weeks but apparently discontinued because of expense. She is proximally 75% improved on therapy. Off of treatment she continues to have periodic diarrhea and lower bowel cramping. She denies NSAID use but does take Celebrex 200 mg a day.Review the medication she is omeprazole 20 mg a day for reasons which are unclear all reviewing her chart.   GI Review of Systems    Reports abdominal pain.      Denies acid reflux, belching, bloating, chest pain, dysphagia with liquids, dysphagia with solids, heartburn, loss of appetite, nausea, vomiting, vomiting blood, weight loss, and  weight gain.        Denies anal fissure, black tarry stools, change in bowel habit, constipation, diarrhea, diverticulosis, fecal incontinence, heme positive stool, hemorrhoids, irritable bowel syndrome, jaundice, light color stool, liver problems, rectal bleeding, and  rectal pain.    Current Medications (verified): 1)  Fosamax 70 Mg Tabs (Alendronate Sodium) .... Take One By Mouth Once A Week 2)  Toprol Xl 25 Mg Xr24h-Tab (Metoprolol Succinate) .... Take 1/2 Tablet By Mouth Once Daily 3)  Plavix 75 Mg Tabs (Clopidogrel Bisulfate) .... Take One By Mouth Once Daily 4)  Replens  Gel (Vaginal Lubricant) .... Use As Directed 5)  Celebrex 200 Mg Caps (Celecoxib) .... Take One By Mouth Once Daily 6)  Womens Multivitamin Plus  Tabs (Multiple Vitamins-Minerals) .... Take One By Mouth Once Daily 7)  Omeprazole 20 Mg Cpdr (Omeprazole) ....  Take One By Mouth Once Daily 8)  Entocort Ec 3 Mg Xr24h-Cap (Budesonide) .... Take Three By Mouth Once Daily  Allergies (verified): 1)  ! Macrodantin 2)  ! Erythromycin  Past History:  Past medical, surgical, family and social histories (including risk factors) reviewed for relevance to current acute and chronic problems.  Past Medical History: Reviewed history from 10/28/2009 and no changes required. 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 lumpectomy, chemo and radiation Tonsillectomy  Family History: Reviewed history from 11/01/2009 and no changes required. No FH of Colon Cancer: Family History of Breast Cancer: Mother- dx'd 73 yo  Social History: Reviewed history from 11/01/2009 and no changes required. Occupation: Veterinary surgeon at [employer]  Married to Dr. Sharia Reeve Alcohol Use - yes 4 glasses per day Illicit Drug Use - no Patient is a former smoker.  Daily Caffeine Use 2 per day Patient gets regular exercise. 3 times a week  Review of Systems       The patient complains of arthritis/joint pain.  The patient denies allergy/sinus, anemia, anxiety-new, back pain, blood in urine, breast changes/lumps, change in vision, confusion, cough, coughing up blood, depression-new, fainting, fatigue, fever, headaches-new, hearing problems, heart murmur, heart rhythm changes, itching, menstrual pain, muscle pains/cramps, night sweats, nosebleeds, pregnancy symptoms, shortness of breath, skin rash, sleeping problems, sore throat, swelling of feet/legs, swollen lymph glands, thirst -  excessive , urination -  excessive , urination changes/pain, urine leakage, vision changes, and voice change.    Vital Signs:  Patient profile:   70 year old female Height:      65 inches Weight:      128 pounds BMI:     21.38 Pulse rate:   68 / minute Pulse rhythm:   regular BP sitting:   130 / 68  (right arm) Cuff size:   regular  Vitals Entered By: June McMurray CMA Duncan Dull) (January 10, 2010 2:36 PM)  Physical Exam  General:  Well developed, well nourished, no acute distress. Head:  Normocephalic and atraumatic. Eyes:  PERRLA, no icterus.exam deferred to patient's ophthalmologist.   Psych:  Alert and cooperative. Normal mood and affect.   Impression & Recommendations:  Problem # 1:  COLITIS (ICD-558.9) Assessment Improved Lymphocytic colitis improved on therapy but treatment hindered by cost of Entocort . Therefore I have given her samples of Lialda 2.4 g a day to use, and to call us with a progress report in several weeks' time. She may need low-dose prednisone therapy for colitis controlled. I have asked her to stop her omeprazole therapy. There had been reports of response to Pepto-Bismol, but this requires 2 tablets 4 times a day which limits its effectiveness as a primary therapy.  Problem # 2:  NEOPLASM, MALIGNANT, BREAST, HX OF (ICD-V10.3) Assessment: Comment Only  Problem # 3:  OSTEOARTHRITIS (ICD-715.90) Assessment: Improved Continue Celebrex as tolerated and avoid regular NSAIDs. Lymphocytic colitis has been associated with nonsteroidal anti-inflammatory drug use.  Patient Instructions: 1)  Copy sent to : Rodrigo Ran, MD 2)  Stop Entocort and start Lialda two by mouth every morning. 3)  Call back in one month to update your symptoms.  4)  The medication list was reviewed and reconciled.  All changed / newly prescribed medications were explained.  A complete medication list was provided to the patient / caregiver. 5)  Stop Omeprazole 6)  Please call our GI Office back in 2 weeks with a  follow-up of symptoms. Prescriptions: LIALDA 1.2 GM TBEC (MESALAMINE) take 2 by mouth every morning  #60 x 1   Entered by:   [name] CMA (AAMA)   Authorized by:   [name] MD [facility]   Signed by:   [name] CMA (AAMA) on 01/10/2010   Method used:   Electronically to        [facility] [phone]* (retail)       [address]       [place], [place]  [address]       Ph: [phone] or [phone]       Fax: [phone]   RxID:   [id number]

## 2010-03-23 NOTE — Progress Notes (Signed)
Summary: Prescription runs out today.  Phone Note Call from Patient Call back at Home Phone [phone]   Call For: Dr Jarold Motto Summary of Call: Has not heard back from nurse and her prescription runs out today. Initial call taken by: Leanor Kail [facility],  February 24, 2010 9:58 AM  Follow-up for Phone Call        rx for lialda sent and pt aware.  Follow-up by: [name] CMA (AAMA),  February 24, 2010 10:04 AM    Prescriptions: LIALDA 1.2 GM TBEC (MESALAMINE) take 2 by mouth every morning  #60 x 6   Entered by:   [name] CMA (AAMA)   Authorized by:   [name] MD [facility]   Signed by:   [name] CMA (AAMA) on 02/24/2010   Method used:   Electronically to        [facility] [phone]* (retail)       [address]       [place], [place]  [address]       Ph: [phone] or [phone]       Fax: [phone]   RxID:   [id number]

## 2010-03-23 NOTE — Letter (Signed)
Summary: Pre Visit Letter Revised  [facility]  [address] [place], [place] [address]   Phone: [phone]  Fax: [phone]        11/15/2009 MRN: [id number] Anna York [address] [employer] [place], [place]  [address]             Procedure Date:  12-19-09   Welcome to the Gastroenterology Division at [facility].    You are scheduled to see a nurse for your pre-procedure visit on 11-22-09 at 2:30p.m. on the 3rd floor at [facility], [address]  We ask that you try to arrive at our office 15 minutes prior to your appointment time to allow for check-in.  Please take a minute to review the attached form.  If you answer "Yes" to one or more of the questions on the first page, we ask that you call the person listed at your earliest opportunity.  If you answer "No" to all of the questions, please complete the rest of the form and bring it to your appointment.    Your nurse visit will consist of discussing your medical and surgical history, your immediate family medical history, and your medications.   If you are unable to list all of your medications on the form, please bring the medication bottles to your appointment and we will list them.  We will need to be aware of both prescribed and over the counter drugs.  We will need to know exact dosage information as well.    Please be prepared to read and sign documents such as consent forms, a financial agreement, and acknowledgement forms.  If necessary, and with your consent, a friend or relative is welcome to sit-in on the nurse visit with you.  Please bring your insurance card so that we may make a copy of it.  If your insurance requires a referral to see a specialist, please bring your referral form from your primary care physician.  No co-pay is required for this nurse visit.     If you cannot keep your appointment, please call [phone] to cancel or reschedule prior to your appointment date.  This  allows [place] the opportunity to schedule an appointment for another patient in need of care.    Thank you for choosing [facility] for your medical needs.  We appreciate the opportunity to care for you.  Please visit [place] at our website  to learn more about our practice.  Sincerely, The Gastroenterology Division

## 2010-03-23 NOTE — Procedures (Signed)
Summary: Colonoscopy  Patient: Anna York Note: All result statuses are Final unless otherwise noted.  Tests: (1) Colonoscopy (COL)   COL Colonoscopy           DONE      [facility]     [address]     [place], [place]  [address]           COLONOSCOPY PROCEDURE REPORT           PATIENT:  Anna York, Anna York  MR#:  [id number]     BIRTHDATE:  1940/05/11, 69 yrs. old  GENDER:  female     ENDOSCOPIST:  Vania Rea. Jarold Motto, MD, [facility]     REF. BY:     PROCEDURE DATE:  12/05/2009     PROCEDURE:  Colonoscopy with biopsy     ASA CLASS:  Class II     INDICATIONS:  unexplained diarrhea     MEDICATIONS:   Fentanyl 100 mcg IV, Versed 10 mg IV           DESCRIPTION OF PROCEDURE:   After the risks benefits and     alternatives of the procedure were thoroughly explained, informed     consent was obtained.  Digital rectal exam was performed and     revealed no abnormalities.   The LB 180AL [id number] endoscope was     introduced through the anus and advanced to the cecum, which was     identified by both the appendix and ileocecal valve, limited by a     redundant colon.  very difficult exam per tiortuosity and     reduundancy.had to extubate and reuse the pediatric colonoscope to     complete the exam.  The quality of the prep was excellent, using     MoviPrep.  The instrument was then slowly withdrawn as the colon     was fully examined.     <<PROCEDUREIMAGES>>           FINDINGS:  There were mild diverticular changes in left colon.     diverticulosis was found.  No polyps or cancers were seen.  This     was otherwise a normal examination of the colon. double random     biopsies every 10 cm done.   Retroflexed views in the rectum     revealed no abnormalities.    The scope was then withdrawn from     the patient and the procedure completed.           COMPLICATIONS:  None     ENDOSCOPIC IMPRESSION:     1) Diverticulosis,mild,left sided diverticulosis     2) No polyps or  cancers     3) Otherwise normal examination     R/O MICROSCOPIC/COLLAGENOUS COLITIS.     RECOMMENDATIONS:     1) Await biopsy results     MAY NEED LOTRONEX TRIAL.     REPEAT EXAM:  No           ______________________________     Vania Rea. Jarold Motto, MD, [facility]           CC:  Rodrigo Ran, MD           n.     Rosalie DoctorMarland Kitchen   Vania Rea. Patterson at 12/05/2009 09:44 AM           Derek [place], [phone]  Note: An exclamation mark (!) indicates a result that was not dispersed into the flowsheet. Document Creation Date: 12/05/2009 9:45 AM _______________________________________________________________________  (1)  Order result status: Final Collection or observation date-time: 12/05/2009 09:36 Requested date-time:  Receipt date-time:  Reported date-time:  Referring Physician:   Ordering Physician: Sheryn Bison [phone]) Specimen Source:  Source: Launa Grill Order Number: [id number] Lab site:

## 2010-03-23 NOTE — Progress Notes (Signed)
Summary: Lab results  Phone Note Call from Patient Call back at [phone]   Caller: Patient Call For: Dr. Jarold Motto Reason for Call: Lab or Test Results Summary of Call: calling about Lab results Initial call taken by: Karna Christmas,  November 11, 2009 2:49 PM  Follow-up for Phone Call        Told stool studies negative.Has only used Immodium x1 since ov.If  loose stools increase will call back. Follow-up by: Teryl Lucy RN,  November 11, 2009 4:40 PM

## 2010-05-01 ENCOUNTER — Other Ambulatory Visit: Payer: Self-pay | Admitting: Radiology

## 2010-05-04 ENCOUNTER — Telehealth: Payer: Self-pay | Admitting: Physician Assistant

## 2010-05-09 NOTE — Progress Notes (Addendum)
Summary: ? re meds  Phone Note Call from Patient Call back at Home Phone [phone]   Caller: Patient Call For: Dr Jarold Motto Reason for Call: Talk to Nurse Summary of Call: Patient wants to speak to nurse regarding her meds. (lialda) Initial call taken by: Tawni Levy,  May 04, 2010 2:38 PM  Follow-up for Phone Call        Lmom at patient's work number for her to call back. Graciella Freer RN  May 04, 2010 3:32 PM   Patient reports she is doing better, but she still has a slight stomach ache some mornings and urgency. She has tried decreasing the Lialda to 1 tab daily, but her diarrhea and pain increases.  She was originally on Entocort- 01/10/10 note- but it was too expensive so she went on Lialda. 02/21/10 note stated she's the same as today. She thinks she then as now that she did better with the Entocort, but she hasn't reached her do nut hole for meds. Chanc Kervin PAC, do you have any suggestions? Thanks. Graciella Freer RN  May 04, 2010 4:09 PM   Additional Follow-up for Phone Call Additional follow up Details #1::        SHE SHOULD STAY ON THE DOSE OF LIALDA DR. PATTERSON WANTED HER TO BE ON-THEN HE NEEDS TO DECIDE ABOUT ENTOCORT OR STEROIDS WHEN HE GETS BACK Additional Follow-up by: Sammuel Cooper PA-c,  May 05, 2010 10:00 AM     Appended Document: ? re meds LMOM for patient at work to call back.  Appended Document: ? re meds Notified patient per Mike Gip PAC , please remain on the dose Dr Jarold Motto wanted her to be on until he comes back on Monday. Dr Jarold Motto can decide about the ENTOCORT OR Steroids. Patient stated understanding.

## 2010-05-12 ENCOUNTER — Telehealth: Payer: Self-pay | Admitting: *Deleted

## 2010-05-12 NOTE — Telephone Encounter (Signed)
Per 05/09/10 note, per Dr Jarold Motto, pt should remain on Lialda 1.2mg  2 tabs daily until her do nut hole is met and we can try the Entocort again. LMOM stating this and that I left samples at the front desk, since she ran out.  LOT [id number]    EXP 03/2012    24 tabs.

## 2010-05-31 LAB — CREATININE, SERUM
Creatinine, Ser: 0.68 mg/dL (ref 0.4–1.2)
GFR calc Af Amer: >60 mL/min (ref >60)

## 2010-07-07 NOTE — Cardiovascular Report (Signed)
NAME:  ANGELICIA, LESSNER           ACCOUNT NO.:  [id number]   MEDICAL RECORD NO.:  [id number]          PATIENT TYPE:  OIB   LOCATION:  6501                         FACILITY:  [facility]   PHYSICIAN:  Colleen Can. Deborah Chalk, M.D.DATE OF BIRTH:  1940-10-01   DATE OF PROCEDURE:  06/21/2004  DATE OF DISCHARGE:                              CARDIAC CATHETERIZATION   HISTORY:  Ms. Susann Givens is referred for catheterization after having exercise-  induced ventricular tachycardia and ventricular ectopy.  She has a history  of mild scoliosis and COPD noted by chest x-ray.   PROCEDURE:  1.  Left heart catheterization with selective coronary angiography.  2.  Left ventricular angiography.   TYPE AND SITE OF ENTRY:  Percutaneous right femoral artery.   CATHETERS:  4-French Judkins right and left coronary catheters (4 curved), 4-  French pigtail ventriculographic catheter.   CONTRAST MATERIAL:  Omnipaque.   MEDICATIONS GIVEN PRIOR TO PROCEDURE:  Valium 10 mg p.o.   MEDICATIONS GIVEN DURING TO PROCEDURE:  Versed 2 mg IV.   COMMENTS:  Patient tolerated the procedure well.   HEMODYNAMIC DATA:  The aortic pressure is 136/80.  LV was 168/11-19.  There  is no aortic valve gradient noted on pullback, however.   ANGIOGRAPHIC DATA:  1.  Right coronary artery is normal.  It is dominant.  2.  Left main coronary artery is normal.  3.  Left anterior descending is normal.  4.  Left circumflex is normal.   Left ventricular angiogram was performed in the RAO position.  Overall  cardiac size and silhouette are normal.  Global ejection fraction is 60%.   OVERALL IMPRESSION:  1.  Normal left ventricular function.  2.  Normal coronary arteries.   DISCUSSION:  In light of the exercise-induced arrhythmia will initiate a  trial of beta blockers, try to see follow-up study to make sure we do have  abolition of her arrhythmia.      SNT/MEDQ  D:  06/21/2004  T:  06/21/2004  Job:  [id number]

## 2010-07-07 NOTE — H&P (Signed)
NAME:  Anna York, STMARIE           ACCOUNT NO.:  [id number]   MEDICAL RECORD NO.:  [id number]          PATIENT TYPE:  OIB   LOCATION:  NA                           FACILITY:  [facility]   PHYSICIAN:  Colleen Can. Deborah Chalk, M.D.DATE OF BIRTH:  1940-07-01   DATE OF ADMISSION:  DATE OF DISCHARGE:                                HISTORY & PHYSICAL   CHIEF COMPLAINT:  Palpitations.   HISTORY OF PRESENT ILLNESS:  The patient is a 70 year old white female.  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  She presented to our office for consultation toward the latter part  of March 2006.  Overall, she has basically enjoyed good health; however, has  begun to have an increased awareness of irregular heart rate as well as a  prominence of the heartbeat that has been lasting for less than 3-5 minutes.  She does not really describe it as a racing type sensation but more of a  hard, forceful sensation.  There has been no associated lightheadedness.  She has actually had no chest pain; however, she does report some degree of  indigestion.  She has had chronic shortness of breath.  She was subsequently  referred for a stress echocardiogram as well as having an event monitor  placed.  Her event monitor has basically been unremarkable with satisfactory  tracings.  She underwent a stress echocardiogram on 06/19/04.  With that, she  was exercised on the standard Bruce protocol.  She exercised for a total of  10 minutes to a maximum heart rate of 176.  Blood pressure rose to 170/80.  The EKG was negative for ischemia; however, the echocardiographic images  were abnormal with reduction of LV function as well as an increase noted in  PVCs.  She is now referred for elective cardiac catheterization.   PAST MEDICAL HISTORY:  1.  Palpitations.  2.  History of lumpectomy in 1992 with previous chemotherapy and radiation      for breast cancer.  3.  Remote history of pneumonia.  4.   Mild scoliosis.  5.  COPD.  6.  Remote tobacco abuse.  7.  Childbirth x1.   ALLERGIES:  ERYTHROMYCIN.   CURRENT MEDICATIONS:  Evista.   FAMILY HISTORY:  Father died at 42 of hypertension and kidney disease.  Mother died at the age of 26 of stroke and asthma.  She has no siblings.   SOCIAL HISTORY:  She lives at home alone.  She is divorced.  She has not  smoked for three years, but she does use alcohol on a daily basis which  involves 2-3 glasses of wine.   REVIEW OF SYSTEMS:  Basically as noted above and is otherwise unremarkable.   PHYSICAL EXAMINATION:  GENERAL:  She is a very pleasant, somewhat anxious  white female in no acute distress.  VITAL SIGNS:  Weight 130 pounds, blood pressure 130/80, heart rate 84 and  regular, respirations 18, she is afebrile.  SKIN:  Warm and dry, color is unremarkable.  LUNGS:  Basically clear.  CARDIAC:  Fairly regular rhythm and rate.  There is no murmur.  ABDOMEN:  Soft with positive bowel sounds, nontender.  EXTREMITIES:  Without edema.  NEUROLOGIC:  Intact.  There are no gross focal deficits.   LABORATORIES:  PT and PTT are unremarkable.  Chemistries are normal.  CBC is  normal.   IMPRESSION:  1.  Abnormal stress echocardiogram.  2.  Palpitations.  3.  Alcohol use.  4.  Remote history of tobacco abuse with subsequent chronic obstructive      pulmonary disease.   PLAN:  Will proceed on with cardiac catheterization.  The procedure has been  discussed in full detail and she is willing to proceed on Wednesday, Jun 21, 2004.      LC/MEDQ  D:  06/20/2004  T:  06/20/2004  Job:  [id number]

## 2010-08-29 ENCOUNTER — Other Ambulatory Visit (HOSPITAL_COMMUNITY)
Admission: RE | Admit: 2010-08-29 | Discharge: 2010-08-29 | Disposition: A | Discharge status: Home / Self Care | Payer: Medicare Other | Source: Ambulatory Visit | Attending: Obstetrics and Gynecology | Admitting: Obstetrics and Gynecology

## 2010-08-29 ENCOUNTER — Telehealth: Payer: Self-pay | Admitting: Gastroenterology

## 2010-08-29 ENCOUNTER — Encounter (INDEPENDENT_AMBULATORY_CARE_PROVIDER_SITE_OTHER): Payer: Medicare Other | Admitting: Obstetrics and Gynecology

## 2010-08-29 ENCOUNTER — Other Ambulatory Visit: Payer: Self-pay | Admitting: Obstetrics and Gynecology

## 2010-08-29 DIAGNOSIS — Z124 Encounter for screening for malignant neoplasm of cervix: Secondary | ICD-10-CM | POA: Insufficient documentation

## 2010-08-29 DIAGNOSIS — N951 Menopausal and female climacteric states: Secondary | ICD-10-CM

## 2010-08-29 DIAGNOSIS — R87610 Atypical squamous cells of undetermined significance on cytologic smear of cervix (ASC-US): Secondary | ICD-10-CM

## 2010-08-29 DIAGNOSIS — R82998 Other abnormal findings in urine: Secondary | ICD-10-CM

## 2010-08-29 DIAGNOSIS — N952 Postmenopausal atrophic vaginitis: Secondary | ICD-10-CM

## 2010-08-29 NOTE — Telephone Encounter (Signed)
Patient has a few questions for Dr Jarold Motto.  She wants to know how long she should have to be treated for lymphocytic colitis? Lialda controls the symptoms, but she wants to know if there is an alternative to this besides Entocort.  She states her husband theora vankirk and you discussed other alternatives.(generic meslamine????)  She is asking if you can call her?  She does not want to wait until 09/19/10 for the next available REV.  Dr Jarold Motto please advise

## 2010-08-29 NOTE — Telephone Encounter (Signed)
Left message for patient to call back  

## 2010-08-30 NOTE — Telephone Encounter (Signed)
Patient is scheduled for REV 09/19/10 2:30 with Dr Jarold Motto.

## 2010-08-30 NOTE — Telephone Encounter (Signed)
I have reviewed this problem with her on multiple occasions. She refuses to buy her medications, and her management has been very frustrating. I will see her office visit as scheduled.

## 2010-09-19 ENCOUNTER — Other Ambulatory Visit (INDEPENDENT_AMBULATORY_CARE_PROVIDER_SITE_OTHER): Payer: Medicare Other | Admitting: Gastroenterology

## 2010-09-19 ENCOUNTER — Ambulatory Visit (INDEPENDENT_AMBULATORY_CARE_PROVIDER_SITE_OTHER): Payer: Medicare Other | Admitting: Gastroenterology

## 2010-09-19 ENCOUNTER — Encounter: Payer: Self-pay | Admitting: Gastroenterology

## 2010-09-19 ENCOUNTER — Other Ambulatory Visit: Payer: Medicare Other

## 2010-09-19 VITALS — BP 134/68 | HR 100 | Ht 65.5 in | Wt 127.8 lb

## 2010-09-19 DIAGNOSIS — K5289 Other specified noninfective gastroenteritis and colitis: Secondary | ICD-10-CM

## 2010-09-19 DIAGNOSIS — Z0000 Encounter for general adult medical examination without abnormal findings: Secondary | ICD-10-CM

## 2010-09-19 DIAGNOSIS — K52832 Lymphocytic colitis: Secondary | ICD-10-CM

## 2010-09-19 LAB — URINALYSIS, ROUTINE W REFLEX MICROSCOPIC
Bilirubin Urine: NEGATIVE
Hgb urine dipstick: NEGATIVE
Ketones, ur: NEGATIVE
Nitrite: NEGATIVE
Specific Gravity, Urine: 1.025
Total Protein, Urine: NEGATIVE
Urine Glucose: NEGATIVE
Urobilinogen, UA: 0.2
pH: 5.5 (ref 5.0–8.0)

## 2010-09-19 MED ORDER — MESALAMINE ER 500 MG PO CPCR: 1000.0000 mg | ORAL_CAPSULE | Freq: Two times a day (BID) | ORAL | Status: DC

## 2010-09-19 NOTE — Progress Notes (Signed)
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 She currently is asymptomatic him more and comes in mostly today to talk about her illness and its etiology and treatment. She is having regular bowel movements without abdominal cramping, other gastrointestinal or systemic complaints. Her appetite is good her weight is stable and she denies any specific food intolerances, upper GI or hepatobiliary problems. She has had problems with expenses her medications, and most to take the least expensive medication available to control her illness. She has refused previous attempts to try Entocort therapy.    Current Medications, Allergies, Past Medical History, Past Surgical History, Family History and Social History were reviewed in [employer] record.   Assessment and plan: Lymphocytic colitis perhaps related to years of Celebrex therapy. She denies current instead use. She appears to be in good remission on Lialda 2.4 g a day. If she can get a better deal on Pentasa, we will prescribe 500 mg 2 tablets twice a day for maintenance therapy. I do not think followup colonoscopy her other labs indicated at this time besides celiac profile. Also will check urinalysis to exclude interstitial nephritis. Encounter Diagnosis  Name Primary?  . Lymphocytic colitis Yes

## 2010-09-19 NOTE — Patient Instructions (Signed)
Please go to the basement today for your labs.  Your Pentasa rx was printed and given to you so you can price compare against the Lialda.

## 2010-09-20 ENCOUNTER — Telehealth: Payer: Self-pay | Admitting: *Deleted

## 2010-09-20 DIAGNOSIS — N390 Urinary tract infection, site not specified: Secondary | ICD-10-CM

## 2010-09-20 LAB — CELIAC PANEL 10
Endomysial Screen: NEGATIVE
IgA: 187 mg/dL (ref 69–380)

## 2010-09-20 NOTE — Telephone Encounter (Signed)
Notified pt she needs to come in for a C&S of urine; she will try to come in this afternoon.

## 2010-09-20 NOTE — Telephone Encounter (Signed)
Pt aware.

## 2010-09-20 NOTE — Telephone Encounter (Signed)
Message copied by Florene Glen on Wed Sep 20, 2010  8:42 AM ------      Message from: Jarold Motto, DAVID R      Created: Wed Sep 20, 2010  8:30 AM       Needs CCU for culture

## 2010-09-20 NOTE — Telephone Encounter (Signed)
Message copied by Leonette Monarch on Wed Sep 20, 2010  3:29 PM ------      Message from: Jarold Motto, DAVID R      Created: Wed Sep 20, 2010  3:24 PM       NEGATIVE EXAM...PLEASE CALL HER.

## 2010-09-21 ENCOUNTER — Other Ambulatory Visit: Payer: Medicare Other

## 2010-09-21 DIAGNOSIS — N390 Urinary tract infection, site not specified: Secondary | ICD-10-CM

## 2010-09-23 LAB — URINE CULTURE: Colony Count: >=100000

## 2010-10-24 ENCOUNTER — Telehealth: Payer: Self-pay | Admitting: Internal Medicine

## 2010-10-24 NOTE — Telephone Encounter (Signed)
Pt aware UA culture is neg no rx needed

## 2010-10-31 ENCOUNTER — Other Ambulatory Visit: Payer: Self-pay | Admitting: *Deleted

## 2010-10-31 DIAGNOSIS — N6310 Unspecified lump in the right breast, unspecified quadrant: Secondary | ICD-10-CM

## 2010-11-13 ENCOUNTER — Other Ambulatory Visit: Payer: Self-pay | Admitting: Obstetrics and Gynecology

## 2010-11-13 DIAGNOSIS — N6310 Unspecified lump in the right breast, unspecified quadrant: Secondary | ICD-10-CM

## 2011-02-26 ENCOUNTER — Encounter: Payer: Self-pay | Admitting: Gynecology

## 2011-02-26 DIAGNOSIS — C801 Malignant (primary) neoplasm, unspecified: Secondary | ICD-10-CM | POA: Insufficient documentation

## 2011-02-26 DIAGNOSIS — N952 Postmenopausal atrophic vaginitis: Secondary | ICD-10-CM | POA: Insufficient documentation

## 2011-03-02 ENCOUNTER — Other Ambulatory Visit (HOSPITAL_COMMUNITY)
Admission: RE | Admit: 2011-03-02 | Discharge: 2011-03-02 | Disposition: A | Discharge status: Home / Self Care | Payer: Medicare Other | Source: Ambulatory Visit | Attending: Obstetrics and Gynecology | Admitting: Obstetrics and Gynecology

## 2011-03-02 ENCOUNTER — Ambulatory Visit (INDEPENDENT_AMBULATORY_CARE_PROVIDER_SITE_OTHER): Payer: Medicare Other | Admitting: Obstetrics and Gynecology

## 2011-03-02 VITALS — BP 140/90

## 2011-03-02 DIAGNOSIS — R87610 Atypical squamous cells of undetermined significance on cytologic smear of cervix (ASC-US): Secondary | ICD-10-CM

## 2011-03-02 DIAGNOSIS — Z01419 Encounter for gynecological examination (general) (routine) without abnormal findings: Secondary | ICD-10-CM | POA: Insufficient documentation

## 2011-03-02 NOTE — Progress Notes (Signed)
Patient came back today for further followup of an abnormal Pap smear. She is now had abnormal Pap smears off and on since 2007. Initially high-risk HPV was detected. Colposcopy and biopsy failed to reveal CIN. Patient's Paps have reverted to normal. However the  Pap smears 6 months ago she had ascus without high-risk HPV. Her last Pap smear done in July of 2012 was normal. For significant period of time she use Vagifem but after a TIA both her neurologist and her PCP wanted her to stop it. She is now using replens.  Pelvic exam: External: Within normal limits. BUS within normal limits. Vaginal exam: Within normal limits with some atrophic changes Cervix: Clean. Uterus: Within normal limits. Adnexa: Within normal limits. Rectovaginal exam: Within normal limits.   Assessment: #1. Ascus #2. Atrophic vaginitis  Plan: We will triage based on results of Pap smear. We will let her know results.if all normal she will return in 6 months.

## 2011-03-06 ENCOUNTER — Other Ambulatory Visit: Payer: Self-pay | Admitting: Gastroenterology

## 2011-05-08 ENCOUNTER — Encounter: Payer: Self-pay | Admitting: Obstetrics and Gynecology

## 2011-05-10 ENCOUNTER — Other Ambulatory Visit: Payer: Self-pay | Admitting: *Deleted

## 2011-05-10 ENCOUNTER — Encounter: Payer: Self-pay | Admitting: Obstetrics and Gynecology

## 2011-05-10 DIAGNOSIS — N6489 Other specified disorders of breast: Secondary | ICD-10-CM

## 2011-05-11 ENCOUNTER — Other Ambulatory Visit: Payer: Self-pay | Admitting: Obstetrics and Gynecology

## 2011-05-11 DIAGNOSIS — N6489 Other specified disorders of breast: Secondary | ICD-10-CM

## 2011-05-11 NOTE — Progress Notes (Signed)
Addended byValeda Malm L on: 05/11/2011 11:36 AM   Modules accepted: Orders

## 2011-09-20 ENCOUNTER — Encounter: Payer: Self-pay | Admitting: Obstetrics and Gynecology

## 2011-09-20 ENCOUNTER — Ambulatory Visit (INDEPENDENT_AMBULATORY_CARE_PROVIDER_SITE_OTHER): Payer: Medicare Other | Admitting: Obstetrics and Gynecology

## 2011-09-20 ENCOUNTER — Other Ambulatory Visit (HOSPITAL_COMMUNITY)
Admission: RE | Admit: 2011-09-20 | Discharge: 2011-09-20 | Disposition: A | Discharge status: Home / Self Care | Payer: Medicare Other | Source: Ambulatory Visit | Attending: Obstetrics and Gynecology | Admitting: Obstetrics and Gynecology

## 2011-09-20 VITALS — BP 124/80 | Ht 65.0 in | Wt 132.0 lb

## 2011-09-20 DIAGNOSIS — IMO0002 Reserved for concepts with insufficient information to code with codable children: Secondary | ICD-10-CM

## 2011-09-20 DIAGNOSIS — N390 Urinary tract infection, site not specified: Secondary | ICD-10-CM

## 2011-09-20 DIAGNOSIS — C50919 Malignant neoplasm of unspecified site of unspecified female breast: Secondary | ICD-10-CM

## 2011-09-20 DIAGNOSIS — M949 Disorder of cartilage, unspecified: Secondary | ICD-10-CM

## 2011-09-20 DIAGNOSIS — Z124 Encounter for screening for malignant neoplasm of cervix: Secondary | ICD-10-CM | POA: Insufficient documentation

## 2011-09-20 DIAGNOSIS — M8580 Other specified disorders of bone density and structure, unspecified site: Secondary | ICD-10-CM

## 2011-09-20 DIAGNOSIS — N952 Postmenopausal atrophic vaginitis: Secondary | ICD-10-CM

## 2011-09-20 DIAGNOSIS — R87610 Atypical squamous cells of undetermined significance on cytologic smear of cervix (ASC-US): Secondary | ICD-10-CM

## 2011-09-20 MED ORDER — LEVOFLOXACIN 500 MG PO TABS: 500.0000 mg | ORAL_TABLET | Freq: Every day | ORAL | Status: AC

## 2011-09-20 NOTE — Addendum Note (Signed)
Addended by: Dayna Barker on: 09/20/2011 03:41 PM   Modules accepted: Orders

## 2011-09-20 NOTE — Progress Notes (Signed)
Patient came to see me today for further followup. She continues to do well without recurrence of her breast cancer. She is up-to-date on mammograms. Her last bone density was 2012 and showed stability of her osteopenia on drug holiday from  Fosamax. She has been off Fosamax for approximately 15 months. She has had no fractures. She is having no vaginal bleeding. She is having no pelvic pain. She does have atrophic vaginitis which responds well to replens. She does get recurrent urinary tract infections and is allergic to Macrodantin and does well with Levaquin. She is currently having no dysuria, frequency or urgency of urination. She has never been formally diagnosed or treated for cervical dysplasia but over the past 7 years has had some abnormal Pap smears. In April, 2007 her Pap smear showed CIN-1 but colposcopy with biopsy failed to reveal dysplasia. In August of 2007 she had ascus on Pap with high-risk HPV not detected. She then had normal Pap smears for a while and in July, 2011 once again had ascus without high risk HPV detected. She is now had two  normal Paps in a row.  ROS: 12 system review done. Pertinent positives above. Other positives include microscopic colitis, TIA,back pain, dysphasia, headaches.  Physical examination: Kennon Portela present. HEENT within normal limits. Neck: Thyroid not large. No masses. Supraclavicular nodes: not enlarged. Breasts: Examined in both sitting and lying  position. No skin changes and no masses. Abdomen: Soft no guarding rebound or masses or hernia. Pelvic: External: Within normal limits. BUS: Within normal limits. Vaginal:within normal limits. Good estrogen effect. No evidence of cystocele rectocele or enterocele. Cervix: clean. Uterus: Normal size and shape. Adnexa: No masses. Rectovaginal exam: Confirmatory and negative. Extremities: Within normal limits.  Assessment: #1. Abnormal Pap smears #2. Atrophic vaginitis #3. Osteopenia on drug holiday #4.  Breast cancer #5. Recurrent urinary tract infections.  Plan: Continually mammograms. If this Pap smear is normal suggest followup Pap in 3 years.The new Pap smear guidelines were discussed with the patient. Prescription for Levaquin 500 mg #3 with one refill to use if the patient gets UTI when she's traveling. Bone density 2014. Continue Replens.

## 2011-09-26 ENCOUNTER — Encounter: Payer: Self-pay | Admitting: Obstetrics and Gynecology

## 2011-10-03 ENCOUNTER — Encounter: Payer: Self-pay | Admitting: *Deleted

## 2011-10-04 ENCOUNTER — Ambulatory Visit (INDEPENDENT_AMBULATORY_CARE_PROVIDER_SITE_OTHER): Payer: Medicare Other | Admitting: Emergency Medicine

## 2011-10-04 ENCOUNTER — Encounter: Payer: Self-pay | Admitting: Emergency Medicine

## 2011-10-04 VITALS — BP 118/80 | HR 99 | Temp 98.1°F | Ht 65.0 in | Wt 132.8 lb

## 2011-10-04 DIAGNOSIS — J449 Chronic obstructive pulmonary disease, unspecified: Secondary | ICD-10-CM

## 2011-10-04 NOTE — Assessment & Plan Note (Addendum)
Very interesting case of progressive dyspnea in a patient with hx rare episodes that sound like asthma flares, primarily with exposures to fumes or dander. Has not been on controller meds because she was so rarely symptomatic. She also has superimposed tobacco use. Her CT scan report describes emphysematous change. Certainly chronic inflammation from smoldering asthma could lead to/contribute to COPD. Recall also the she had ASD seen on a prior TTE, although not on most recent study. The new echo did estimate her pulm pressures at 30-34mmHg, which could reflect chronic lung disease. Once we sort out whether she has modifiable obstructive lung disease we may want to consider R heart cath.  - stop pulmicort fo now  - will check full PFT - will teach her to use albuterol to use prn - If AFL on the PFT will either restart pulmicort or start LABA/ICS.  - ROV 2 weeks

## 2011-10-04 NOTE — Patient Instructions (Addendum)
Please stop Pulmicort for now.  Use albuterol 2 puffs up to every 4 hours if needed for shortness of breath We will perform full pulmonary function testing Follow with Dr Delton Coombes in 2 weeks. We will decide at that time whether to restart the Pulmicort or to start an alternative medication

## 2011-10-04 NOTE — Progress Notes (Signed)
Subjective:    Patient ID: Anna York, female    DOB: March 27, 1940, 71 y.o.   MRN: [id number] HPI 71 yo woman, former smoker (20 pk-yrs), hx breast CA, cerebrovascular disease, ? ASD (hasn't had TEE, TTE on 09/04/11 did not see it, PASP estimated 30-66mHg), HTN. She has been told that she had asthma in the past - seemed to be related to pet hair exposure, paint fumes. On these (rare) occasions, she responded well to albuterol. More recently she has been under eval by Dr Waynard Edwards for exertional SOB. She recalls that her exertional tolerance has always been less than her peers, but now she has noticed that she has trouble with stairs, a brisk walk, especially if she is also talking. She reports occasional wheezing. Her CT scan chest 09/11/11 report is available - shows no PE, emphysematous changes. For this reason, a1-AT testing was done, not available yet.  She has stopped exercising about 2 yrs ago, has gained about 7 lbs.    Review of Systems  Constitutional: Negative for fever, chills, diaphoresis, activity change, appetite change, fatigue and unexpected weight change.  HENT: Positive for nosebleeds. Negative for congestion, sore throat, rhinorrhea, sneezing, mouth sores, trouble swallowing, postnasal drip and sinus pressure.   Respiratory: Positive for shortness of breath and wheezing. Negative for cough, choking and chest tightness.   Cardiovascular: Negative for chest pain and palpitations.  Gastrointestinal: Negative for nausea, vomiting and constipation.  Genitourinary: Negative for dysuria and difficulty urinating.  Musculoskeletal: Negative for arthralgias and gait problem.  Skin: Negative for rash.  Neurological: Negative for dizziness, weakness, light-headedness, numbness and headaches.  Hematological: Does not bruise/bleed easily.  Psychiatric/Behavioral: Negative for confusion, disturbed wake/sleep cycle and agitation. The patient is not nervous/anxious.     Past Medical History    Diagnosis Date  . Dysphagia   . Unspecified transient cerebral ischemia   . Palpitations   . Osteoarthritis   . Cystitis, unspecified   . Osteopenia   . Headache   . Backache, unspecified   . Personal history of colonic polyps 07/07/2002    hyperplastic  . Personal history of malignant neoplasm of breast   . Constipation   . Lymphocytic colitis   . Atrophic vaginitis   . Osteoporosis   . ASCUS (atypical squamous cells of undetermined significance) on Pap smear     Neg high risk HPV  . Cancer 1992    Breast cancer-Chemo and Radiation  . TIA (transient ischemic attack)      Family History  Problem Relation Age of Onset  . Breast cancer Mother     Age 80  . Hypertension Father      History   Social History  . Marital Status: Married    Spouse Name: N/A    Number of Children: 1  . 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     Social History Main Topics  . Smoking status: Former Smoker    Types: Cigarettes    Quit date: 02/20/2000  . Smokeless tobacco: Never Used  . Alcohol Use: 10.0 oz/week    20 drink(s) per week  . Drug Use: No  . Sexually Active: Yes    Birth Control/ Protection: Post-menopausal   Other Topics Concern  . Not on file   Social History Narrative   Married to Dr Sharia Reeve     Allergies  Allergen Reactions  . Erythromycin  REACTION: nausea  . Nitrofurantoin     REACTION: hives and rash     Outpatient Prescriptions Prior to Visit  Medication Sig Dispense Refill  . clopidogrel (PLAVIX) 75 MG tablet Take 75 mg by mouth daily.        Marland Kitchen LIALDA 1.2 G EC tablet take 2 tablets by mouth every morning  60 tablet  6  . Metoprolol Succinate (TOPROL XL PO) Take 25 mg by mouth.       . Vaginal Lubricant (REPLENS) GEL Use as directed       . mesalamine (PENTASA) 500 MG CR capsule Take 2 capsules (1,000 mg total) by mouth 2 (two) times daily.  120 capsule  3       Objective:   Physical  Exam Filed Vitals:   10/04/11 1433  BP: 118/80  Pulse: 99  Temp: 98.1 F (36.7 C)   Gen: Pleasant, well-nourished, in no distress,  normal affect  ENT: No lesions,  mouth clear,  oropharynx clear, no postnasal drip  Neck: No JVD, no TMG, no carotid bruits  Lungs: No use of accessory muscles, no dullness to percussion, clear without rales or rhonchi  Cardiovascular: RRR, heart sounds normal, no murmur or gallops, no peripheral edema  Musculoskeletal: No deformities, no cyanosis or clubbing  Neuro: alert, non focal  Skin: Warm, no lesions or rashes      Assessment & Plan:

## 2011-10-18 ENCOUNTER — Ambulatory Visit (INDEPENDENT_AMBULATORY_CARE_PROVIDER_SITE_OTHER): Payer: Medicare Other | Admitting: Emergency Medicine

## 2011-10-18 ENCOUNTER — Encounter: Payer: Self-pay | Admitting: Emergency Medicine

## 2011-10-18 VITALS — BP 138/80 | HR 73 | Temp 98.3°F | Ht 65.0 in | Wt 134.0 lb

## 2011-10-18 DIAGNOSIS — J449 Chronic obstructive pulmonary disease, unspecified: Secondary | ICD-10-CM

## 2011-10-18 DIAGNOSIS — J4489 Other specified chronic obstructive pulmonary disease: Secondary | ICD-10-CM

## 2011-10-18 LAB — PULMONARY FUNCTION TEST

## 2011-10-18 MED ORDER — BUDESONIDE-FORMOTEROL FUMARATE 160-4.5 MCG/ACT IN AERO: 2.0000 | INHALATION_SPRAY | Freq: Two times a day (BID) | RESPIRATORY_TRACT | Status: DC

## 2011-10-18 NOTE — Patient Instructions (Addendum)
Please start Symbicort 160/4.52mcg, 2 puffs twice a day Use albuterol 2 puffs up to every 4 hours if needed for shortness of breath.  Follow with Dr Delton Coombes in 2 months or sooner if you have any problems.

## 2011-10-18 NOTE — Assessment & Plan Note (Addendum)
PFT are consistent with COPD +/- fixed asthma, FEV1 49% predicted moderate-to-severe. - start symbicort to gauge clinical response - SABA prn - rov 2 months

## 2011-10-18 NOTE — Progress Notes (Signed)
  Subjective:    Patient ID: Anna York, female    DOB: Apr 09, 1940, 71 y.o.   MRN: [id number] HPI 71 yo woman, former smoker (20 pk-yrs), hx breast CA, cerebrovascular disease, ? ASD (hasn't had TEE, TTE on 09/04/11 did not see it, PASP estimated 30-56mHg), HTN. She has been told that she had asthma in the past - seemed to be related to pet hair exposure, paint fumes. On these (rare) occasions, she responded well to albuterol. More recently she has been under eval by Dr Waynard Edwards for exertional SOB. She recalls that her exertional tolerance has always been less than her peers, but now she has noticed that she has trouble with stairs, a brisk walk, especially if she is also talking. She reports occasional wheezing. Her CT scan chest 09/11/11 report is available - shows no PE, emphysematous changes. For this reason, a1-AT testing was done, not available yet.  She has stopped exercising about 2 yrs ago, has gained about 7 lbs.   ROV 10/18/11 -- f/u for chronic and episodic dyspnea, ? COPD with emphysematous change seen on imaging. Returns today after PFT >> moderately severe AFL, no BD response, hyperinflated RV, decreased DLCO that corrects for Va. She tried the albuterol and didn't notice very much. Her a-AT testing was normal.    PULMONARY FUNCTON TEST 10/18/2011  FVC 2.53  FEV1 1.03  FEV1/FVC 40.7  FVC  % Predicted 86  FEV % Predicted 49  FeF 25-75 .34  FeF 25-75 % Predicted 2.35       Objective:   Physical Exam Filed Vitals:   10/18/11 1621  BP: 138/80  Pulse: 73  Temp: 98.3 F (36.8 C)   Gen: Pleasant, well-nourished, in no distress,  normal affect  ENT: No lesions,  mouth clear,  oropharynx clear, no postnasal drip  Neck: No JVD, no TMG, no carotid bruits  Lungs: No use of accessory muscles, no dullness to percussion, clear without rales or rhonchi  Cardiovascular: RRR, heart sounds normal, no murmur or gallops, no peripheral edema  Musculoskeletal: No deformities, no  cyanosis or clubbing  Neuro: alert, non focal  Skin: Warm, no lesions or rashes      Assessment & Plan:

## 2011-10-18 NOTE — Progress Notes (Signed)
PFT done today. 

## 2011-12-18 ENCOUNTER — Ambulatory Visit (INDEPENDENT_AMBULATORY_CARE_PROVIDER_SITE_OTHER): Payer: Medicare Other | Admitting: Emergency Medicine

## 2011-12-18 ENCOUNTER — Encounter: Payer: Self-pay | Admitting: Emergency Medicine

## 2011-12-18 VITALS — BP 128/72 | HR 88 | Temp 97.9°F | Ht 65.5 in | Wt 136.0 lb

## 2011-12-18 DIAGNOSIS — J449 Chronic obstructive pulmonary disease, unspecified: Secondary | ICD-10-CM

## 2011-12-18 NOTE — Patient Instructions (Addendum)
We will check to see if there is a cheaper alternative to Symbicort If we continue Symbicort then we will start using a spacer Use albuterol as needed Follow with Dr Delton Coombes in 6 months or sooner if you have any problems

## 2011-12-18 NOTE — Progress Notes (Signed)
  Subjective:    Patient ID: Anna York, female    DOB: 1940-04-17, 71 y.o.   MRN: [id number] HPI 71 yo woman, former smoker (20 pk-yrs), hx breast CA, cerebrovascular disease, ? ASD (hasn't had TEE, TTE on 09/04/11 did not see it, PASP estimated 30-8mHg), HTN. She has been told that she had asthma in the past - seemed to be related to pet hair exposure, paint fumes. On these (rare) occasions, she responded well to albuterol. More recently she has been under eval by Dr Waynard Edwards for exertional SOB. She recalls that her exertional tolerance has always been less than her peers, but now she has noticed that she has trouble with stairs, a brisk walk, especially if she is also talking. She reports occasional wheezing. Her CT scan chest 09/11/11 report is available - shows no PE, emphysematous changes. For this reason, a1-AT testing was done, not available yet.  She has stopped exercising about 2 yrs ago, has gained about 7 lbs.   ROV 10/18/11 -- f/u for chronic and episodic dyspnea, ? COPD with emphysematous change seen on imaging. Returns today after PFT >> moderately severe AFL, no BD response, hyperinflated RV, decreased DLCO that corrects for Va. She tried the albuterol and didn't notice very much. Her a-AT testing was normal.    PULMONARY FUNCTON TEST 10/18/2011  FVC 2.53  FEV1 1.03  FEV1/FVC 40.7  FVC  % Predicted 86  FEV % Predicted 49  FeF 25-75 .34  FeF 25-75 % Predicted 2.35   ROV 12/18/11 -- f/u for dyspnea, PFT show moderately severe obstruction. Last time we started Symbicort >> she can tell some improvement in her breathing. She has concerns about whether her lung fxn will continue to decline. She has used her SABA rarely. She had an AE right after she returned from [place] 9/13.       Objective:   Physical Exam Filed Vitals:   12/18/11 1408  BP: 128/72  Pulse: 88  Temp: 97.9 F (36.6 C)   Gen: Pleasant, well-nourished, in no distress,  normal affect  ENT: No lesions,  mouth  clear,  oropharynx clear, no postnasal drip  Neck: No JVD, no TMG, no carotid bruits  Lungs: No use of accessory muscles, no dullness to percussion, clear without rales or rhonchi  Cardiovascular: RRR, heart sounds normal, no murmur or gallops, no peripheral edema  Musculoskeletal: No deformities, no cyanosis or clubbing  Neuro: alert, non focal  Skin: Warm, no lesions or rashes      Assessment & Plan:  COPD (chronic obstructive pulmonary disease) - will assess her insurance and see if there is a preferred choice LABA/ICS - if Symbicort is the choice, then we will try using a spacer to insure adequate delivery - albuterol prn - rov 6 months

## 2011-12-18 NOTE — Assessment & Plan Note (Signed)
-   will assess her insurance and see if there is a preferred choice LABA/ICS - if Symbicort is the choice, then we will try using a spacer to insure adequate delivery - albuterol prn - rov 6 months

## 2011-12-19 ENCOUNTER — Telehealth: Payer: Self-pay | Admitting: Emergency Medicine

## 2011-12-19 MED ORDER — AEROCHAMBER Z-STAT PLUS MISC: Status: DC

## 2011-12-19 NOTE — Addendum Note (Signed)
Addended by: Darrell Jewel on: 12/19/2011 05:22 PM   Modules accepted: Orders

## 2011-12-19 NOTE — Telephone Encounter (Signed)
I spoke with the pt and she states her insurance said all of inhalers are tier 3 so she is going to stick with symbicort. Per last ov note Dr. Delton Coombes recommended: We will check to see if there is a cheaper alternative to Symbicort  If we continue Symbicort then we will start using a spacer Pt wants the spacer. I advised that I will set one aside and when she comes in to ask for a nurse to show her how to use spacer. Pt states understanding. Carron Curie, CMA

## 2012-02-20 HISTORY — PX: CATARACT EXTRACTION: SUR2

## 2012-04-28 ENCOUNTER — Other Ambulatory Visit: Payer: Self-pay | Admitting: *Deleted

## 2012-04-28 MED ORDER — MESALAMINE 1.2 G PO TBEC: DELAYED_RELEASE_TABLET | ORAL | Status: DC

## 2012-05-02 ENCOUNTER — Telehealth: Payer: Self-pay | Admitting: *Deleted

## 2012-05-02 NOTE — Telephone Encounter (Signed)
I received via fax from [facility] a refill request for Lialda.  I sent Lialda to [facility] on 3-10.  I called patient to tell her that Theodosia Blender is at the [facility] or did she want it sent to [facility].   Waiting on call back had to leave message.

## 2012-05-05 ENCOUNTER — Other Ambulatory Visit: Payer: Self-pay | Admitting: *Deleted

## 2012-05-05 MED ORDER — MESALAMINE 1.2 G PO TBEC: DELAYED_RELEASE_TABLET | ORAL | Status: DC

## 2012-05-05 NOTE — Telephone Encounter (Signed)
Patient called back and said that she needs prescription sent to [facility], she can no longer use [facility].  Prescription sent.

## 2012-05-09 ENCOUNTER — Encounter: Payer: Self-pay | Admitting: Gastroenterology

## 2012-05-09 ENCOUNTER — Ambulatory Visit (INDEPENDENT_AMBULATORY_CARE_PROVIDER_SITE_OTHER): Payer: Medicare Other | Admitting: Gastroenterology

## 2012-05-09 VITALS — BP 134/82 | HR 68 | Ht 65.0 in | Wt 138.0 lb

## 2012-05-09 DIAGNOSIS — K5289 Other specified noninfective gastroenteritis and colitis: Secondary | ICD-10-CM

## 2012-05-09 DIAGNOSIS — K529 Noninfective gastroenteritis and colitis, unspecified: Secondary | ICD-10-CM

## 2012-05-09 MED ORDER — MESALAMINE 1.2 G PO TBEC: DELAYED_RELEASE_TABLET | ORAL | Status: DC

## 2012-05-09 NOTE — Progress Notes (Signed)
This is a 72 year old Caucasian female with lymphocytic colitis who has had an excellent response to by mouth amino salicylates in the form of Lialda 2.4 g a day.  She currently is asymptomatic and has no diarrhea, abdominal pain, upper GI or hepatobiliary complaints.  Her appetite is good, weight is stable, she denies any food intolerances.  She otherwise is healthy without systemic complaints.  She is followed closely by Dr. Waynard Edwards in primary care.  She does take Plavix daily and topical XL.  Current Medications, Allergies, Past Medical History, Past Surgical History, Family History and Social History were reviewed in [employer] record.  ROS: All systems were reviewed and are negative unless otherwise stated in the HPI.          Physical Exam: Blood pressure 134/82, pulse 60 and regular, and weight 138 with a BMI of 22.96.  I cannot appreciate stigmata of chronic liver disease.  Chest is clear she is in a regular rhythm without murmurs gallops or rubs.  There is no organomegaly, abdominal masses or tenderness.  Bowel sounds are normal.  Peripheral extremities are unremarkable and mental status is normal.    Assessment and Plan: Lymphocytic colitis doing well on low dose oral aminosalicylate therapy.  I have renewed her prescription for Lialda 2.4 g a day and we'll see her yearly or when necessary as needed.  I've asked her to avoid NSAIDs if at all possible and to continue other medications as per Dr. Waynard Edwards.   Cc Dr. Waynard Edwards No diagnosis found.

## 2012-05-09 NOTE — Patient Instructions (Addendum)
  Prescription sent for Lialda, please continue taking one tablet by mouth twice daily.  Please follow up in one year with Dr. Jarold Motto. ________________________________________________________________________________________________________                                               We are excited to introduce MyChart, a new best-in-class service that provides you online access to important information in your electronic medical record. We want to make it easier for you to view your health information - all in one secure location - when and where you need it. We expect MyChart will enhance the quality of care and service we provide.  When you register for MyChart, you can:    View your test results.    Request appointments and receive appointment reminders via email.    Request medication renewals.    View your medical history, allergies, medications and immunizations.    Communicate with your physician's office through a password-protected site.    Conveniently print information such as your medication lists.  To find out if MyChart is right for you, please talk to a member of our clinical staff today. We will gladly answer your questions about this free health and wellness tool.  If you are age 71 or older and want a member of your family to have access to your record, you must provide written consent by completing a proxy form available at our office. Please speak to our clinical staff about guidelines regarding accounts for patients younger than age 53.  As you activate your MyChart account and need any technical assistance, please call the MyChart technical support line at [phone]) or email your question to [email or web address]. If you email your question(s), please include your name, a return phone number and the best time to reach you.  If you have non-urgent health-related questions, you can send a message to our office through MyChart at  [email or web address]. If you have a medical emergency, call 911.  Thank you for using MyChart as your new health and wellness resource!   MyChart licensed from [employer],  [id number]. Patents Pending.

## 2012-05-23 ENCOUNTER — Other Ambulatory Visit: Payer: Self-pay | Admitting: Dermatology

## 2012-06-12 ENCOUNTER — Ambulatory Visit (INDEPENDENT_AMBULATORY_CARE_PROVIDER_SITE_OTHER): Payer: Medicare Other | Admitting: Emergency Medicine

## 2012-06-12 ENCOUNTER — Encounter: Payer: Self-pay | Admitting: Emergency Medicine

## 2012-06-12 VITALS — BP 140/80 | HR 66 | Temp 97.9°F | Ht 65.0 in | Wt 137.2 lb

## 2012-06-12 DIAGNOSIS — J449 Chronic obstructive pulmonary disease, unspecified: Secondary | ICD-10-CM

## 2012-06-12 NOTE — Assessment & Plan Note (Signed)
She questions whether she can stop Symbicort, not sure that she wants to continue - will trial stopping symbicort, follow clinically - continue SABA prn - she is asking about the new PNA shot, wants to know the name so she can see if it is covered by her insurance.  - rov 78mo

## 2012-06-12 NOTE — Patient Instructions (Addendum)
The name of the new PNA shot is Prevnar. I don't know whether it will be covered by your insurance, because studies are still being done to determine if it is superior to the standard Pneumovax.  Please stop Symbicort. Call our office if you miss it and we will restart.  Continue to have your albuterol as needed Follow with Dr Delton Coombes in 2 months to discuss your breathing off the symbicort

## 2012-06-12 NOTE — Progress Notes (Signed)
  Subjective:    Patient ID: Anna York, female    DOB: [birth year], 72 y.o.   MRN: [id number] HPI 72 yo woman, former smoker (20 pk-yrs), hx breast CA, cerebrovascular disease, ? ASD (hasn't had TEE, TTE on 09/04/11 did not see it, PASP estimated 30-19mHg), HTN. She has been told that she had asthma in the past - seemed to be related to pet hair exposure, paint fumes. On these (rare) occasions, she responded well to albuterol. More recently she has been under eval by Dr Waynard Edwards for exertional SOB. She recalls that her exertional tolerance has always been less than her peers, but now she has noticed that she has trouble with stairs, a brisk walk, especially if she is also talking. She reports occasional wheezing. Her CT scan chest 09/11/11 report is available - shows no PE, emphysematous changes. For this reason, a1-AT testing was done, not available yet.  She has stopped exercising about 2 yrs ago, has gained about 7 lbs.   ROV 10/18/11 -- f/u for chronic and episodic dyspnea, ? COPD with emphysematous change seen on imaging. Returns today after PFT >> moderately severe AFL, no BD response, hyperinflated RV, decreased DLCO that corrects for Va. She tried the albuterol and didn't notice very much. Her a-AT testing was normal.    PULMONARY FUNCTON TEST 10/18/2011  FVC 2.53  FEV1 1.03  FEV1/FVC 40.7  FVC  % Predicted 86  FEV % Predicted 49  FeF 25-75 .34  FeF 25-75 % Predicted 2.35   ROV 12/18/11 -- f/u for dyspnea, PFT show moderately severe obstruction. Last time we started Symbicort >> she can tell some improvement in her breathing. She has concerns about whether her lung fxn will continue to decline. She has used her SABA rarely. She had an AE right after she returned from [place] 9/13.   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 Returns today for regular f/u. Has been maintained on Symbicort bid. She has missed 1-2 days, but she restarted it because she heard some  wheeze w exertion and was worried that she needed it. She has barely ever used albuterol.  No AE since last visit.       Objective:   Physical Exam Filed Vitals:   06/12/12 1435  BP: 140/80  Pulse: 66  Temp: 97.9 F (36.6 C)   Gen: Pleasant, well-nourished, in no distress,  normal affect  ENT: No lesions,  mouth clear,  oropharynx clear, no postnasal drip  Neck: No JVD, no TMG, no carotid bruits  Lungs: No use of accessory muscles, no dullness to percussion, clear without rales or rhonchi  Cardiovascular: RRR, heart sounds normal, no murmur or gallops, no peripheral edema  Musculoskeletal: No deformities, no cyanosis or clubbing  Neuro: alert, non focal  Skin: Warm, no lesions or rashes      Assessment & Plan:  COPD (chronic obstructive pulmonary disease) She questions whether she can stop Symbicort, not sure that she wants to continue - will trial stopping symbicort, follow clinically - continue SABA prn - she is asking about the new PNA shot, wants to know the name so she can see if it is covered by her insurance.  - rov 55mo

## 2012-06-26 ENCOUNTER — Telehealth: Payer: Self-pay | Admitting: Emergency Medicine

## 2012-06-26 NOTE — Telephone Encounter (Signed)
As long as she feels back to baseline on the symbicort I am ok with pushing it back to 6 months

## 2012-06-26 NOTE — Telephone Encounter (Signed)
Spoke with patient, she states she had to restart on the symbicort. States she was beginning to get SOB very quickly Patient would like to know that since she started inhaler back does she still need to come in in June (2 month f/u)  Or can she push out her f/u appt to a later date ie 6 months? Dr. Delton Coombes, please advise, thank you  Last OV:06/12/12 Next Visit: 08/07/12

## 2012-06-26 NOTE — Telephone Encounter (Signed)
ATC pt NA WCB 

## 2012-06-27 NOTE — Telephone Encounter (Signed)
[email or web address].[email or web address] Castillo, CMA  

## 2012-06-30 NOTE — Telephone Encounter (Signed)
Called, spoke with pt.  Informed her of below per RB. Pt states she does feel back to baseline on Symbicort. She would like to cancel her pending OV with RB in June and reschedule in 6 months from last OV in April 2014. I have cancelled her June pending appt and placed a reminder for the Oct 2014 f/u with RB (6 months from April 2014 OV). Pt aware of this and will call back if anything is needed prior to this.

## 2012-08-07 ENCOUNTER — Ambulatory Visit: Payer: Medicare Other | Admitting: Emergency Medicine

## 2012-11-21 ENCOUNTER — Telehealth: Payer: Self-pay | Admitting: Emergency Medicine

## 2012-11-21 DIAGNOSIS — J449 Chronic obstructive pulmonary disease, unspecified: Secondary | ICD-10-CM

## 2012-11-21 MED ORDER — BUDESONIDE-FORMOTEROL FUMARATE 160-4.5 MCG/ACT IN AERO: 2.0000 | INHALATION_SPRAY | Freq: Two times a day (BID) | RESPIRATORY_TRACT | Status: DC

## 2012-11-21 NOTE — Telephone Encounter (Signed)
Rx has been sent in. Pt is aware. 

## 2012-12-23 ENCOUNTER — Ambulatory Visit (INDEPENDENT_AMBULATORY_CARE_PROVIDER_SITE_OTHER): Payer: Medicare Other | Admitting: Emergency Medicine

## 2012-12-23 ENCOUNTER — Encounter: Payer: Self-pay | Admitting: Emergency Medicine

## 2012-12-23 VITALS — BP 118/70 | HR 76 | Ht 65.0 in | Wt 135.4 lb

## 2012-12-23 DIAGNOSIS — J449 Chronic obstructive pulmonary disease, unspecified: Secondary | ICD-10-CM

## 2012-12-23 NOTE — Progress Notes (Signed)
Subjective:    Patient ID: Anna York, female    DOB: October 29, 1940, 72 y.o.   MRN: [id number] HPI 72 yo woman, former smoker (20 pk-yrs), hx breast CA, cerebrovascular disease, ? ASD (hasn't had TEE, TTE on 09/04/11 did not see it, PASP estimated 30-10mHg), HTN. She has been told that she had asthma in the past - seemed to be related to pet hair exposure, paint fumes. On these (rare) occasions, she responded well to albuterol. More recently she has been under eval by Dr Waynard Edwards for exertional SOB. She recalls that her exertional tolerance has always been less than her peers, but now she has noticed that she has trouble with stairs, a brisk walk, especially if she is also talking. She reports occasional wheezing. Her CT scan chest 09/11/11 report is available - shows no PE, emphysematous changes. For this reason, a1-AT testing was done, not available yet.  She has stopped exercising about 2 yrs ago, has gained about 7 lbs.   ROV 71/29/13 -- f/u for chronic and episodic dyspnea, ? COPD with emphysematous change seen on imaging. Returns today after PFT >> moderately severe AFL, no BD response, hyperinflated RV, decreased DLCO that corrects for Va. She tried the albuterol and didn't notice very much. Her a-AT testing was normal.      PULMONARY FUNCTON TEST 10/18/2011  FVC 2.53  FEV1 1.03  FEV1/FVC 40.7  FVC  % Predicted 86  FEV % Predicted 49  FeF 25-75 .34  FeF 25-75 % Predicted 2.35   ROV 12/18/11 -- f/u for dyspnea, PFT show moderately severe obstruction. Last time we started Symbicort >> she can tell some improvement in her breathing. She has concerns about whether her lung fxn will continue to decline. She has used her SABA rarely. She had an AE right after she returned from [place] 9/13.   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 Returns today for regular f/u. Has been maintained on Symbicort bid. She has missed 1-2 days, but she restarted it because she heard some  wheeze w exertion and was worried that she needed it. She has barely ever used albuterol.  No AE since last visit.   ROV 12/23/12 -- f/u for COPD and emphysematous changes on CT.  She reports that she had CAP dx about 2 weeks ago. She was having sudden dyspnea, fatigue, some fever, minimal cough. Was treated with levaquin. She now back to baseline. She continues on symbicort, rare SABA use.  I don't have a copy of her CXR.      Objective:   Physical Exam Filed Vitals:   12/23/12 1517  BP: 118/70  Pulse: 76  Height: 5\' 5"  (1.651 m)  Weight: 135 lb 6.4 oz (61.417 kg)  SpO2: 96%   Gen: Pleasant, well-nourished, in no distress,  normal affect  ENT: No lesions,  mouth clear,  oropharynx clear, no postnasal drip  Neck: No JVD, no TMG, no carotid bruits  Lungs: No use of accessory muscles, no dullness to percussion, clear without rales or rhonchi  Cardiovascular: RRR, heart sounds normal, no murmur or gallops, no peripheral edema  Musculoskeletal: No deformities, no cyanosis or clubbing  Neuro: alert, non focal  Skin: Warm, no lesions or rashes      Assessment & Plan:  COPD (chronic obstructive pulmonary disease) Please continue your Symbicort twice a day Use your albuterol 2 puffs as needed. You may want to try taking it 10 minutes before exercise to see  if it helps your breathing as you walk.  Follow with Dr Delton Coombes in 6 months or sooner if you have any problems

## 2012-12-23 NOTE — Assessment & Plan Note (Signed)
Please continue your Symbicort twice a day Use your albuterol 2 puffs as needed. You may want to try taking it 10 minutes before exercise to see if it helps your breathing as you walk.  Follow with Dr Delton Coombes in 6 months or sooner if you have any problems

## 2012-12-23 NOTE — Patient Instructions (Signed)
Please continue your Symbicort twice a day Use your albuterol 2 puffs as needed. You may want to try taking it 10 minutes before exercise to see if it helps your breathing as you walk.  Follow with Dr Eulalia Ellerman in 6 months or sooner if you have any problems 

## 2013-02-26 ENCOUNTER — Other Ambulatory Visit: Payer: Self-pay | Admitting: Emergency Medicine

## 2013-03-02 ENCOUNTER — Other Ambulatory Visit: Payer: Self-pay | Admitting: Emergency Medicine

## 2013-03-02 ENCOUNTER — Telehealth: Payer: Self-pay | Admitting: Emergency Medicine

## 2013-03-02 MED ORDER — BUDESONIDE-FORMOTEROL FUMARATE 160-4.5 MCG/ACT IN AERO: INHALATION_SPRAY | RESPIRATORY_TRACT | Status: DC

## 2013-03-02 NOTE — Telephone Encounter (Signed)
rx has been sent to the pharmacy and i called and lmom to make her aware.

## 2013-03-26 ENCOUNTER — Ambulatory Visit: Payer: Medicare Other | Admitting: Gastroenterology

## 2013-03-26 ENCOUNTER — Encounter: Payer: Self-pay | Admitting: *Deleted

## 2013-03-31 ENCOUNTER — Encounter: Payer: Self-pay | Admitting: Cardiology

## 2013-04-01 ENCOUNTER — Encounter: Payer: Self-pay | Admitting: Cardiology

## 2013-04-03 ENCOUNTER — Ambulatory Visit: Payer: Medicare Other | Admitting: Gastroenterology

## 2013-07-14 ENCOUNTER — Encounter: Payer: Self-pay | Admitting: Emergency Medicine

## 2013-07-14 ENCOUNTER — Ambulatory Visit (INDEPENDENT_AMBULATORY_CARE_PROVIDER_SITE_OTHER): Payer: Medicare Other | Admitting: Emergency Medicine

## 2013-07-14 VITALS — BP 118/80 | HR 93 | Ht 65.5 in | Wt 139.8 lb

## 2013-07-14 DIAGNOSIS — J449 Chronic obstructive pulmonary disease, unspecified: Secondary | ICD-10-CM

## 2013-07-14 MED ORDER — ALBUTEROL SULFATE HFA 108 (90 BASE) MCG/ACT IN AERS: 2.0000 | INHALATION_SPRAY | RESPIRATORY_TRACT | Status: DC | PRN

## 2013-07-14 NOTE — Patient Instructions (Signed)
We will do a 1 month trial of Anoro once a day Continue your albuterol 2 puffs if needed for shortness of breath Call our office in a month to let us know if we should continue the Anoro or go back to the Symbicort.  Follow with Dr Lamonte Sakai in 6 months or sooner if you have any problems

## 2013-07-14 NOTE — Progress Notes (Signed)
Subjective:    Patient ID: Anna York, female    DOB: [birth year], 73 y.o.   MRN: [id number] HPI 73 yo woman, former smoker (20 pk-yrs), hx breast CA, cerebrovascular disease, ? ASD (hasn't had TEE, TTE on 09/04/11 did not see it, PASP estimated 30-30mHg), HTN. She has been told that she had asthma in the past - seemed to be related to pet hair exposure, paint fumes. On these (rare) occasions, she responded well to albuterol. More recently she has been under eval by Dr Joylene Draft for exertional SOB. She recalls that her exertional tolerance has always been less than her peers, but now she has noticed that she has trouble with stairs, a brisk walk, especially if she is also talking. She reports occasional wheezing. Her CT scan chest 09/11/11 report is available - shows no PE, emphysematous changes. For this reason, a1-AT testing was done, not available yet.  She has stopped exercising about 2 yrs ago, has gained about 7 lbs.   ROV 10/18/11 -- f/u for chronic and episodic dyspnea, ? COPD with emphysematous change seen on imaging. Returns today after PFT >> moderately severe AFL, no BD response, hyperinflated RV, decreased DLCO that corrects for Va. She tried the albuterol and didn't notice very much. Her a-AT testing was normal.    PULMONARY FUNCTON TEST 10/18/2011  FVC 2.53  FEV1 1.03  FEV1/FVC 40.7  FVC  % Predicted 86  FEV % Predicted 49  FeF 25-75 .34  FeF 25-75 % Predicted 2.35    ROV 12/18/11 -- f/u for dyspnea, PFT show moderately severe obstruction. Last time we started Symbicort >> she can tell some improvement in her breathing. She has concerns about whether her lung fxn will continue to decline. She has used her SABA rarely. She had an AE right after she returned from [place] 9/13.   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 Returns today for regular f/u. Has been maintained on Symbicort bid. She has missed 1-2 days, but she restarted it because she heard some  wheeze w exertion and was worried that she needed it. She has barely ever used albuterol.  No AE since last visit.   ROV 12/23/12 -- f/u for COPD and emphysematous changes on CT.  She reports that she had CAP dx about 2 weeks ago. She was having sudden dyspnea, fatigue, some fever, minimal cough. Was treated with levaquin. She now back to baseline. She continues on symbicort, rare SABA use.  I don't have a copy of her CXR.  ROV 07/14/13 -- hx COPD and emphysematous changes on CT. She has been stable. She notes that walking an incline is very difficult. Carrying objects is difficult. She is able to do the work anyway.  No flares, no prednisone. No allergy troubles.       Objective:   Physical Exam Filed Vitals:   07/14/13 1443  BP: 118/80  Pulse: 93  Height: 5' 5.5" (1.664 m)  Weight: 139 lb 12.8 oz (63.413 kg)  SpO2: 96%   Gen: Pleasant, well-nourished, in no distress,  normal affect  ENT: No lesions,  mouth clear,  oropharynx clear, no postnasal drip  Neck: No JVD, no TMG, no carotid bruits  Lungs: No use of accessory muscles, no dullness to percussion, clear without rales or rhonchi  Cardiovascular: RRR, heart sounds normal, no murmur or gallops, no peripheral edema  Musculoskeletal: No deformities, no cyanosis or clubbing  Neuro: alert, non focal  Skin: Warm, no  lesions or rashes      Assessment & Plan:  COPD (chronic obstructive pulmonary disease) She is doing well, no flares. Would like to consider a change to Anoro to see if she gets more benefit. She will call me if it is a better choice.   We will do a 1 month trial of Anoro once a day Continue your albuterol 2 puffs if needed for shortness of breath Call our office in a month to let us know if we should continue the Anoro or go back to the Symbicort.  Follow with Dr Lamonte Sakai in 6 months or sooner if you have any problems

## 2013-07-14 NOTE — Assessment & Plan Note (Signed)
She is doing well, no flares. Would like to consider a change to Anoro to see if she gets more benefit. She will call me if it is a better choice.   We will do a 1 month trial of Anoro once a day Continue your albuterol 2 puffs if needed for shortness of breath Call our office in a month to let us know if we should continue the Anoro or go back to the Symbicort.  Follow with Dr Lamonte Sakai in 6 months or sooner if you have any problems

## 2013-08-13 ENCOUNTER — Telehealth: Payer: Self-pay | Admitting: Emergency Medicine

## 2013-08-13 MED ORDER — BUDESONIDE-FORMOTEROL FUMARATE 160-4.5 MCG/ACT IN AERO: INHALATION_SPRAY | RESPIRATORY_TRACT | Status: DC

## 2013-08-13 NOTE — Telephone Encounter (Signed)
Called and spoke with pt and she stated that she was seen by RB about 1 month ago and she was given anoro.  She stated that at first this worked very good for her but then she was coughing when she was using this.  She did use the sample up.  She stated that the anoro was going to cost her $50 per month.  She has decided to go back to the symbicort.  She stated that she feels the symbicort helped her more with her breathing and she will stay on this.  Will forward to RB to make him aware.

## 2013-08-14 NOTE — Telephone Encounter (Signed)
Ok thank you 

## 2013-10-19 ENCOUNTER — Telehealth: Payer: Self-pay | Admitting: Emergency Medicine

## 2013-10-19 MED ORDER — NYSTATIN 100000 UNIT/ML MT SUSP: 5.0000 mL | Freq: Three times a day (TID) | OROMUCOSAL | Status: DC

## 2013-10-19 NOTE — Telephone Encounter (Signed)
Called and spoke with pt and she stated that she thinks that she has thrush.  She said this has been going on for about 2-3 weeks.  She has a white film in her mouth and she has been brushing her teeth about 4 times per day.  Pt stated that her mouth is very uncomfortable and would like something to be called in for this.  RB please advise. Thanks  Allergies  Allergen Reactions  . Erythromycin     REACTION: nausea  . Nitrofurantoin     REACTION: hives and rash    Current Outpatient Prescriptions on File Prior to Visit  Medication Sig Dispense Refill  . albuterol (PROVENTIL HFA;VENTOLIN HFA) 108 (90 BASE) MCG/ACT inhaler Inhale 2 puffs into the lungs as needed.  1 Inhaler  3  . budesonide-formoterol (SYMBICORT) 160-4.5 MCG/ACT inhaler INHALE 2 PUFFS INTO THE LUNGS TWICE DAILY  10.2 g  5  . budesonide-formoterol (SYMBICORT) 160-4.5 MCG/ACT inhaler INHALE 2 PUFFS INTO THE LUNGS TWICE DAILY  10.2 g  3  . Cholecalciferol (VITAMIN D-400 PO) Take 1 tablet by mouth daily. 200 daily      . clopidogrel (PLAVIX) 75 MG tablet Take 75 mg by mouth daily.        . mesalamine (LIALDA) 1.2 G EC tablet PLEASE TAKE ONE TABLETS BY MOUTH IN THE MORINING      . Metoprolol Succinate (TOPROL XL PO) Take 12.5 mg by mouth.       . Spacer/Aero-Holding Chambers (AEROCHAMBER Z-STAT PLUS) inhaler Use as instructed  1 each  0  . Vaginal Lubricant (REPLENS) GEL Use as directed        No current facility-administered medications on file prior to visit.

## 2013-10-19 NOTE — Telephone Encounter (Signed)
Please let her know I sent nystatin swish&spit to her pharmacy. Also make sure she is rinsing well after symbicort. If she thought her breathing could tolerate then she could consider stopping the symbicort 2-3 days

## 2013-10-21 NOTE — Telephone Encounter (Signed)
Called spoke w/ pt. Aware of recs. She has already picked up RX. Nothing further needed

## 2013-12-21 ENCOUNTER — Encounter: Payer: Self-pay | Admitting: Emergency Medicine

## 2013-12-24 ENCOUNTER — Encounter: Payer: Self-pay | Admitting: Gynecology

## 2013-12-24 ENCOUNTER — Other Ambulatory Visit (HOSPITAL_COMMUNITY)
Admission: RE | Admit: 2013-12-24 | Discharge: 2013-12-24 | Disposition: A | Discharge status: Home / Self Care | Payer: Medicare Other | Source: Ambulatory Visit | Attending: Gynecology | Admitting: Gynecology

## 2013-12-24 ENCOUNTER — Ambulatory Visit (INDEPENDENT_AMBULATORY_CARE_PROVIDER_SITE_OTHER): Payer: Medicare Other | Admitting: Gynecology

## 2013-12-24 VITALS — BP 114/70 | Ht 65.0 in | Wt 135.0 lb

## 2013-12-24 DIAGNOSIS — Z124 Encounter for screening for malignant neoplasm of cervix: Secondary | ICD-10-CM | POA: Diagnosis present

## 2013-12-24 DIAGNOSIS — M810 Age-related osteoporosis without current pathological fracture: Secondary | ICD-10-CM

## 2013-12-24 DIAGNOSIS — N952 Postmenopausal atrophic vaginitis: Secondary | ICD-10-CM

## 2013-12-24 DIAGNOSIS — N368 Other specified disorders of urethra: Secondary | ICD-10-CM

## 2013-12-24 NOTE — Addendum Note (Signed)
Addended by: Nelva Nay on: 12/24/2013 03:18 PM   Modules accepted: Orders

## 2013-12-24 NOTE — Progress Notes (Signed)
Anna York Nov 19, 1940 [id number]        73 y.o.  G1P1001 for follow up exam. Former patient Dr. Cherylann Banas. Several issues noted below.  Past medical history,surgical history, problem list, medications, allergies, family history and social history were all reviewed and documented as reviewed in the EPIC chart.  ROS:  12 system ROS performed with pertinent positives and negatives included in the history, assessment and plan.   Additional significant findings :  none   Exam: [name] Vitals:   12/24/13 1411  BP: 114/70  Height: 5\' 5"  (1.651 m)  Weight: 135 lb (61.236 kg)   General appearance:  Normal affect, orientation and appearance. Skin: Grossly normal HEENT: Without gross lesions.  No cervical or supraclavicular adenopathy. Thyroid normal.  Lungs:  Clear without wheezing, rales or rhonchi Cardiac: RR, without RMG Abdominal:  Soft, nontender, without masses, guarding, rebound, organomegaly or hernia Breasts:  Examined lying and sitting without masses, retractions, discharge or axillary adenopathy.  Lumpectomy scar on the left with some distortion, old and stable. Pelvic:  Ext/BUS/vagina with generalized atrophic changes. Mild urethral prolapse noted.  Cervix atrophic. Pap done  Uterus anteverted, normal size, shape and contour, midline and mobile nontender   Adnexa  Without masses or tenderness    Anus and perineum  Normal   Rectovaginal  Normal sphincter tone without palpated masses or tenderness.    Assessment/Plan:  73 y.o. G45P1001 female for follow up exam.   1. Postmenopausal/atrophic genital changes. Patient without significant symptoms of hot flashes, night sweats, vaginal dryness. Is not sexually active. No vaginal bleeding. Continue to monitor. Report any vaginal bleeding. 2. Mild urethral prolapse. Asymptomatic to the patient. Continue observation. 3. Osteopenia. DEXA 2012 T score -2.1. Had been on Fosamax for approximately 5-6 years by her history.  Previously on Evista with her history of breast cancer. Is 3 years on a drug holiday. Has follow up DEXA scheduled at her mammogram facility and will follow up for this. Increased calcium vitamin D reviewed. 4. Pap smear 2013. Pap smear done today.  Dr. 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 Follow up Pap smear 2007 with ASCUS negative high-risk HPV. July 2011 ASCUS negative high-risk HPV. Negative Pap smears 3 since then. 5. Mammography 04/2013.  History of left breast cancer status post lumpectomy chemotherapy and radiation 1992.  Exam NED. Continue with annual mammography. S/P monthly reviewed. 6. Colonoscopy 2011. Repeat at their recommended interval. 7. Health maintenance. No routine blood work done as she reports this done at her primary physician's office. Follow up in one year, sooner as needed.     [name] MD, 3:02 PM 12/24/2013

## 2013-12-24 NOTE — Patient Instructions (Signed)
You may obtain a copy of any labs that were done today by logging onto MyChart as outlined in the instructions provided with your AVS (after visit summary). The office will not call with normal lab results but certainly if there are any significant abnormalities then we will contact you.   Health Maintenance, Female A healthy lifestyle and preventative care can promote health and wellness.  Maintain regular health, dental, and eye exams.  Eat a healthy diet. Foods like vegetables, fruits, whole grains, low-fat dairy products, and lean protein foods contain the nutrients you need without too many calories. Decrease your intake of foods high in solid fats, added sugars, and salt. Get information about a proper diet from your caregiver, if necessary.  Regular physical exercise is one of the most important things you can do for your health. Most adults should get at least 150 minutes of moderate-intensity exercise (any activity that increases your heart rate and causes you to sweat) each week. In addition, most adults need muscle-strengthening exercises on 2 or more days a week.   Maintain a healthy weight. The body mass index (BMI) is a screening tool to identify possible weight problems. It provides an estimate of body fat based on height and weight. Your caregiver can help determine your BMI, and can help you achieve or maintain a healthy weight. For adults 20 years and older:  A BMI below 18.5 is considered underweight.  A BMI of 18.5 to 24.9 is normal.  A BMI of 25 to 29.9 is considered overweight.  A BMI of 30 and above is considered obese.  Maintain normal blood lipids and cholesterol by exercising and minimizing your intake of saturated fat. Eat a balanced diet with plenty of fruits and vegetables. Blood tests for lipids and cholesterol should begin at age 61 and be repeated every 5 years. If your lipid or cholesterol levels are high, you are over 50, or you are a high risk for heart  disease, you may need your cholesterol levels checked more frequently.Ongoing high lipid and cholesterol levels should be treated with medicines if diet and exercise are not effective.  If you smoke, find out from your caregiver how to quit. If you do not use tobacco, do not start.  Lung cancer screening is recommended for adults aged 33 80 years who are at high risk for developing lung cancer because of a history of smoking. Yearly low-dose computed tomography (CT) is recommended for people who have at least a 30-pack-year history of smoking and are a current smoker or have quit within the past 15 years. A pack year of smoking is smoking an average of 1 pack of cigarettes a day for 1 year (for example: 1 pack a day for 30 years or 2 packs a day for 15 years). Yearly screening should continue until the smoker has stopped smoking for at least 15 years. Yearly screening should also be stopped for people who develop a health problem that would prevent them from having lung cancer treatment.  If you are pregnant, do not drink alcohol. If you are breastfeeding, be very cautious about drinking alcohol. If you are not pregnant and choose to drink alcohol, do not exceed 1 drink per day. One drink is considered to be 12 ounces (355 mL) of beer, 5 ounces (148 mL) of wine, or 1.5 ounces (44 mL) of liquor.  Avoid use of street drugs. Do not share needles with anyone. Ask for help if you need support or instructions about stopping  the use of drugs.  High blood pressure causes heart disease and increases the risk of stroke. Blood pressure should be checked at least every 1 to 2 years. Ongoing high blood pressure should be treated with medicines, if weight loss and exercise are not effective.  If you are 59 to 72 years old, ask your caregiver if you should take aspirin to prevent strokes.  Diabetes screening involves taking a blood sample to check your fasting blood sugar level. This should be done once every 3  years, after age 91, if you are within normal weight and without risk factors for diabetes. Testing should be considered at a younger age or be carried out more frequently if you are overweight and have at least 1 risk factor for diabetes.  Breast cancer screening is essential preventative care for women. You should practice "breast self-awareness." This means understanding the normal appearance and feel of your breasts and may include breast self-examination. Any changes detected, no matter how small, should be reported to a caregiver. Women in their 66s and 30s should have a clinical breast exam (CBE) by a caregiver as part of a regular health exam every 1 to 3 years. After age 101, women should have a CBE every year. Starting at age 100, women should consider having a mammogram (breast X-ray) every year. Women who have a family history of breast cancer should talk to their caregiver about genetic screening. Women at a high risk of breast cancer should talk to their caregiver about having an MRI and a mammogram every year.  Breast cancer gene (BRCA)-related cancer risk assessment is recommended for women who have family members with BRCA-related cancers. BRCA-related cancers include breast, ovarian, tubal, and peritoneal cancers. Having family members with these cancers may be associated with an increased risk for harmful changes (mutations) in the breast cancer genes BRCA1 and BRCA2. Results of the assessment will determine the need for genetic counseling and BRCA1 and BRCA2 testing.  The Pap test is a screening test for cervical cancer. Women should have a Pap test starting at age 57. Between ages 25 and 35, Pap tests should be repeated every 2 years. Beginning at age 37, you should have a Pap test every 3 years as long as the past 3 Pap tests have been normal. If you had a hysterectomy for a problem that was not cancer or a condition that could lead to cancer, then you no longer need Pap tests. If you are  between ages 50 and 76, and you have had normal Pap tests going back 10 years, you no longer need Pap tests. If you have had past treatment for cervical cancer or a condition that could lead to cancer, you need Pap tests and screening for cancer for at least 20 years after your treatment. If Pap tests have been discontinued, risk factors (such as a new sexual partner) need to be reassessed to determine if screening should be resumed. Some women have medical problems that increase the chance of getting cervical cancer. In these cases, your caregiver may recommend more frequent screening and Pap tests.  The human papillomavirus (HPV) test is an additional test that may be used for cervical cancer screening. The HPV test looks for the virus that can cause the cell changes on the cervix. The cells collected during the Pap test can be tested for HPV. The HPV test could be used to screen women aged 44 years and older, and should be used in women of any age  who have unclear Pap test results. After the age of 55, women should have HPV testing at the same frequency as a Pap test.  Colorectal cancer can be detected and often prevented. Most routine colorectal cancer screening begins at the age of 44 and continues through age 20. However, your caregiver may recommend screening at an earlier age if you have risk factors for colon cancer. On a yearly basis, your caregiver may provide home test kits to check for hidden blood in the stool. Use of a small camera at the end of a tube, to directly examine the colon (sigmoidoscopy or colonoscopy), can detect the earliest forms of colorectal cancer. Talk to your caregiver about this at age 86, when routine screening begins. Direct examination of the colon should be repeated every 5 to 10 years through age 13, unless early forms of pre-cancerous polyps or small growths are found.  Hepatitis C blood testing is recommended for all people born from [birth year] through 1964 and any  individual with known risks for hepatitis C.  Practice safe sex. Use condoms and avoid high-risk sexual practices to reduce the spread of sexually transmitted infections (STIs). Sexually active women aged 36 and younger should be checked for Chlamydia, which is a common sexually transmitted infection. Older women with new or multiple partners should also be tested for Chlamydia. Testing for other STIs is recommended if you are sexually active and at increased risk.  Osteoporosis is a disease in which the bones lose minerals and strength with aging. This can result in serious bone fractures. The risk of osteoporosis can be identified using a bone density scan. Women ages 20 and over and women at risk for fractures or osteoporosis should discuss screening with their caregivers. Ask your caregiver whether you should be taking a calcium supplement or vitamin D to reduce the rate of osteoporosis.  Menopause can be associated with physical symptoms and risks. Hormone replacement therapy is available to decrease symptoms and risks. You should talk to your caregiver about whether hormone replacement therapy is right for you.  Use sunscreen. Apply sunscreen liberally and repeatedly throughout the day. You should seek shade when your shadow is shorter than you. Protect yourself by wearing long sleeves, pants, a wide-brimmed hat, and sunglasses year round, whenever you are outdoors.  Notify your caregiver of new moles or changes in moles, especially if there is a change in shape or color. Also notify your caregiver if a mole is larger than the size of a pencil eraser.  Stay current with your immunizations. Document Released: 08/21/2010 Document Revised: 06/02/2012 Document Reviewed: 08/21/2010 [facility] Patient Information 2014 [employer].

## 2013-12-25 LAB — URINALYSIS W MICROSCOPIC + REFLEX CULTURE
Bacteria, UA: NONE SEEN
Bilirubin Urine: NEGATIVE
CASTS: NONE SEEN
CRYSTALS: NONE SEEN
GLUCOSE, UA: NEGATIVE mg/dL
HGB URINE DIPSTICK: NEGATIVE
Ketones, ur: NEGATIVE mg/dL
Nitrite: NEGATIVE
PH: 5 (ref 5.0–8.0)
Protein, ur: NEGATIVE mg/dL
SPECIFIC GRAVITY, URINE: 1.018 (ref 1.005–1.030)
Urobilinogen, UA: 0.2 mg/dL (ref 0.0–1.0)

## 2013-12-26 LAB — URINE CULTURE
Colony Count: NO GROWTH
Organism ID, Bacteria: NO GROWTH

## 2013-12-28 LAB — CYTOLOGY - PAP

## 2014-03-08 ENCOUNTER — Other Ambulatory Visit: Payer: Self-pay | Admitting: Internal Medicine

## 2014-03-10 ENCOUNTER — Telehealth: Payer: Self-pay | Admitting: Internal Medicine

## 2014-03-10 MED ORDER — BUDESONIDE-FORMOTEROL FUMARATE 160-4.5 MCG/ACT IN AERO: INHALATION_SPRAY | RESPIRATORY_TRACT | Status: DC

## 2014-03-10 NOTE — Telephone Encounter (Signed)
Called and lmom to make the pt aware that rx for the symbicort has been sent to her pharmacy.  Nothing further is needed.

## 2014-04-12 ENCOUNTER — Other Ambulatory Visit: Payer: Self-pay | Admitting: Emergency Medicine

## 2014-06-28 ENCOUNTER — Telehealth: Payer: Self-pay | Admitting: Emergency Medicine

## 2014-06-28 ENCOUNTER — Ambulatory Visit: Payer: Self-pay | Admitting: Adult Health

## 2014-06-28 NOTE — Telephone Encounter (Signed)
error 

## 2014-09-30 ENCOUNTER — Encounter: Payer: Self-pay | Admitting: Gastroenterology

## 2014-10-11 ENCOUNTER — Ambulatory Visit (INDEPENDENT_AMBULATORY_CARE_PROVIDER_SITE_OTHER): Payer: Medicare Other | Admitting: Emergency Medicine

## 2014-10-11 ENCOUNTER — Encounter: Payer: Self-pay | Admitting: Emergency Medicine

## 2014-10-11 VITALS — BP 130/80 | HR 91 | Ht 65.0 in | Wt 129.0 lb

## 2014-10-11 DIAGNOSIS — J449 Chronic obstructive pulmonary disease, unspecified: Secondary | ICD-10-CM | POA: Diagnosis not present

## 2014-10-11 MED ORDER — TIOTROPIUM BROMIDE MONOHYDRATE 18 MCG IN CAPS: 18.0000 ug | ORAL_CAPSULE | Freq: Every day | RESPIRATORY_TRACT | Status: DC

## 2014-10-11 NOTE — Assessment & Plan Note (Signed)
She has recovered from recent AE, but she continues to be limited. I would like to add spiriva to her symbicort, see if she gets any benefit. Follow in 4-6 weeks.

## 2014-10-11 NOTE — Progress Notes (Signed)
Subjective:    Patient ID: Anna York, female    DOB: [birth year], 74 y.o.   MRN: [id number] HPI 74 yo woman, former smoker (20 pk-yrs), hx breast CA, cerebrovascular disease, ? ASD (hasn't had TEE, TTE on 09/04/11 did not see it, PASP estimated 30-71mHg), HTN. She has been told that she had asthma in the past - seemed to be related to pet hair exposure, paint fumes. On these (rare) occasions, she responded well to albuterol. More recently she has been under eval by Dr Joylene Draft for exertional SOB. She recalls that her exertional tolerance has always been less than her peers, but now she has noticed that she has trouble with stairs, a brisk walk, especially if she is also talking. She reports occasional wheezing. Her CT scan chest 09/11/11 report is available - shows no PE, emphysematous changes. For this reason, a1-AT testing was done, not available yet.  She has stopped exercising about 2 yrs ago, has gained about 7 lbs.   ROV 10/18/11 -- f/u for chronic and episodic dyspnea, ? COPD with emphysematous change seen on imaging. Returns today after PFT >> moderately severe AFL, no BD response, hyperinflated RV, decreased DLCO that corrects for Va. She tried the albuterol and didn't notice very much. Her a-AT testing was normal.    PULMONARY FUNCTON TEST 10/18/2011  FVC 2.53  FEV1 1.03  FEV1/FVC 40.7  FVC  % Predicted 86  FEV % Predicted 49  FeF 25-75 .34  FeF 25-75 % Predicted 2.35    ROV 12/18/11 -- f/u for dyspnea, PFT show moderately severe obstruction. Last time we started Symbicort >> she can tell some improvement in her breathing. She has concerns about whether her lung fxn will continue to decline. She has used her SABA rarely. She had an AE right after she returned from [place] 9/13.   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 Returns today for regular f/u. Has been maintained on Symbicort bid. She has missed 1-2 days, but she restarted it because she heard some  wheeze w exertion and was worried that she needed it. She has barely ever used albuterol.  No AE since last visit.   ROV 12/23/12 -- f/u for COPD and emphysematous changes on CT.  She reports that she had CAP dx about 2 weeks ago. She was having sudden dyspnea, fatigue, some fever, minimal cough. Was treated with levaquin. She now back to baseline. She continues on symbicort, rare SABA use.  I don't have a copy of her CXR.  ROV 07/14/13 -- hx COPD and emphysematous changes on CT. She has been stable. She notes that walking an incline is very difficult. Carrying objects is difficult. She is able to do the work anyway.  No flares, no prednisone. No allergy troubles.   ROV 10/11/14 -- follows up for her COPD.  We did a trial of anoro last time, but she didn't see a benefit, went back to Symbicort.  She uses SABA rarely. She continues to have exertional SOB, has started to notice some wheezing. She has had an AE in may '16, was treated by Dr Joylene Draft.       Objective:   Physical Exam Filed Vitals:   10/11/14 1153  BP: 130/80  Pulse: 91  Height: 5\' 5"  (1.651 m)  Weight: 129 lb (58.514 kg)  SpO2: 97%   Gen: Pleasant, well-nourished, in no distress,  normal affect  ENT: No lesions,  mouth clear,  oropharynx clear, no  postnasal drip  Neck: No JVD, no TMG, no carotid bruits  Lungs: No use of accessory muscles, very mild B exp wheezes.   Cardiovascular: RRR, heart sounds normal, no murmur or gallops, no peripheral edema  Musculoskeletal: No deformities, no cyanosis or clubbing  Neuro: alert, non focal  Skin: Warm, no lesions or rashes      Assessment & Plan:  COPD (chronic obstructive pulmonary disease) She has recovered from recent AE, but she continues to be limited. I would like to add spiriva to her symbicort, see if she gets any benefit. Follow in 4-6 weeks.

## 2014-10-11 NOTE — Patient Instructions (Signed)
Please continue your Symbicort twice a day  We will start Spiriva once a day to see if you benefit Take albuterol 2 puffs up to every 4 hours if needed for shortness of breath.  Follow with Dr Lamonte Sakai in 4 to 6 weeks or sooner if you have any problems.

## 2014-10-11 NOTE — Addendum Note (Signed)
Addended by: Desmond Dike C on: 10/11/2014 12:28 PM   Modules accepted: Orders

## 2014-10-12 ENCOUNTER — Telehealth: Payer: Self-pay | Admitting: Emergency Medicine

## 2014-10-12 MED ORDER — BUDESONIDE-FORMOTEROL FUMARATE 160-4.5 MCG/ACT IN AERO: INHALATION_SPRAY | RESPIRATORY_TRACT | Status: DC

## 2014-10-12 NOTE — Telephone Encounter (Signed)
Refill of Symbicort sent to pharmacy. Attempted to contact patient, no answer, will call back.

## 2014-10-13 NOTE — Telephone Encounter (Signed)
Spoke with pt and advised that rx for Symbicort was sent to [facility] and is ready for pick up.

## 2014-11-10 ENCOUNTER — Encounter: Payer: Self-pay | Admitting: Emergency Medicine

## 2014-11-10 ENCOUNTER — Ambulatory Visit (INDEPENDENT_AMBULATORY_CARE_PROVIDER_SITE_OTHER): Payer: Medicare Other | Admitting: Emergency Medicine

## 2014-11-10 VITALS — BP 126/68 | HR 87 | Temp 98.0°F | Ht 65.0 in | Wt 133.6 lb

## 2014-11-10 DIAGNOSIS — J449 Chronic obstructive pulmonary disease, unspecified: Secondary | ICD-10-CM

## 2014-11-10 NOTE — Assessment & Plan Note (Signed)
She has benefited from the addition of Spiriva to her Symbicort one month ago. Her exertional tolerance is better although she does feel that she may have plateaued some. Over the last week she's had some increased dry cough but does not ascribe this to the new medication. No wheezing on exam today and no evidence of exacerbation. She has had some slight increased congestion but is not interested in treating rhinitis at this time. We will continue her Spiriva, Symbicort, albuterol as needed. She knows to get the flu shot this fall. Follow-up in 6 months

## 2014-11-10 NOTE — Progress Notes (Signed)
Subjective:    Patient ID: Anna York, female    DOB: 06-17-1940, 74 y.o.   MRN: [id number] HPI 74 yo woman, former smoker (20 pk-yrs), hx breast CA, cerebrovascular disease, ? ASD (hasn't had TEE, TTE on 09/04/11 did not see it, PASP estimated 30-70mHg), HTN. She has been told that she had asthma in the past - seemed to be related to pet hair exposure, paint fumes. On these (rare) occasions, she responded well to albuterol. More recently she has been under eval by Dr Joylene Draft for exertional SOB. She recalls that her exertional tolerance has always been less than her peers, but now she has noticed that she has trouble with stairs, a brisk walk, especially if she is also talking. She reports occasional wheezing. Her CT scan chest 09/11/11 report is available - shows no PE, emphysematous changes. For this reason, a1-AT testing was done, not available yet.  She has stopped exercising about 2 yrs ago, has gained about 7 lbs.   ROV 10/18/11 -- f/u for chronic and episodic dyspnea, ? COPD with emphysematous change seen on imaging. Returns today after PFT >> moderately severe AFL, no BD response, hyperinflated RV, decreased DLCO that corrects for Va. She tried the albuterol and didn't notice very much. Her a-AT testing was normal.    PULMONARY FUNCTON TEST 10/18/2011  FVC 2.53  FEV1 1.03  FEV1/FVC 40.7  FVC  % Predicted 86  FEV % Predicted 49  FeF 25-75 .34  FeF 25-75 % Predicted 2.35    ROV 12/18/11 -- f/u for dyspnea, PFT show moderately severe obstruction. Last time we started Symbicort >> she can tell some improvement in her breathing. She has concerns about whether her lung fxn will continue to decline. She has used her SABA rarely. She had an AE right after she returned from [place] 9/13.   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 Returns today for regular f/u. Has been maintained on Symbicort bid. She has missed 1-2 days, but she restarted it because she heard some  wheeze w exertion and was worried that she needed it. She has barely ever used albuterol.  No AE since last visit.   ROV 12/23/12 -- f/u for COPD and emphysematous changes on CT.  She reports that she had CAP dx about 2 weeks ago. She was having sudden dyspnea, fatigue, some fever, minimal cough. Was treated with levaquin. She now back to baseline. She continues on symbicort, rare SABA use.  I don't have a copy of her CXR.  ROV 07/14/13 -- hx COPD and emphysematous changes on CT. She has been stable. She notes that walking an incline is very difficult. Carrying objects is difficult. She is able to do the work anyway.  No flares, no prednisone. No allergy troubles.   ROV 10/11/14 -- follows up for her COPD.  We did a trial of anoro last time, but she didn't see a benefit, went back to Symbicort.  She uses SABA rarely. She continues to have exertional SOB, has started to notice some wheezing. She has had an AE in may '16, was treated by Dr Joylene Draft.   ROV 11/10/14 -- follow-up visit for COPD. I last saw her one month ago. Last time we added Spiriva to symbicort. She feels that she has benefited significantly. She has noticed over the last several days that she has had a bit more   Cough, non-productive. She has noticed some slight increased exertional SOB. Uses albuterol rarely  and it helps her. Some slight increase in congestion.                                                                                                                        Objective:   Physical Exam Filed Vitals:   11/10/14 1350  BP: 126/68  Pulse: 87  Temp: 98 F (36.7 C)  TempSrc: Oral  Height: 5\' 5"  (1.651 m)  Weight: 133 lb 9.6 oz (60.601 kg)  SpO2: 96%   Gen: Pleasant, well-nourished, in no distress,  normal affect  ENT: No lesions,  mouth clear,  oropharynx clear, no postnasal drip  Neck: No JVD, no TMG, no carotid bruits  Lungs: No use of accessory muscles, very mild B exp wheezes.   Cardiovascular: RRR, heart  sounds normal, no murmur or gallops, no peripheral edema  Musculoskeletal: No deformities, no cyanosis or clubbing  Neuro: alert, non focal  Skin: Warm, no lesions or rashes      Assessment & Plan:  COPD (chronic obstructive pulmonary disease) She has benefited from the addition of Spiriva to her Symbicort one month ago. Her exertional tolerance is better although she does feel that she may have plateaued some. Over the last week she's had some increased dry cough but does not ascribe this to the new medication. No wheezing on exam today and no evidence of exacerbation. She has had some slight increased congestion but is not interested in treating rhinitis at this time. We will continue her Spiriva, Symbicort, albuterol as needed. She knows to get the flu shot this fall. Follow-up in 6 months

## 2014-11-10 NOTE — Patient Instructions (Signed)
Please continue your Symbicort and Spiriva as you are taking them  Get the flu shot this Fall Follow with Dr Lamonte Sakai in 6 months or sooner if you have any problems

## 2015-02-03 ENCOUNTER — Encounter: Payer: Self-pay | Admitting: Gynecology

## 2015-02-03 ENCOUNTER — Ambulatory Visit (INDEPENDENT_AMBULATORY_CARE_PROVIDER_SITE_OTHER): Payer: Medicare Other | Admitting: Gynecology

## 2015-02-03 VITALS — BP 120/74 | Ht 65.0 in | Wt 132.0 lb

## 2015-02-03 DIAGNOSIS — M8580 Other specified disorders of bone density and structure, unspecified site: Secondary | ICD-10-CM

## 2015-02-03 DIAGNOSIS — N368 Other specified disorders of urethra: Secondary | ICD-10-CM | POA: Diagnosis not present

## 2015-02-03 DIAGNOSIS — Z01419 Encounter for gynecological examination (general) (routine) without abnormal findings: Secondary | ICD-10-CM | POA: Diagnosis not present

## 2015-02-03 DIAGNOSIS — N952 Postmenopausal atrophic vaginitis: Secondary | ICD-10-CM

## 2015-02-03 NOTE — Progress Notes (Signed)
Anna York 07-Jul-1940 PM:[id number]        74 y.o.  G1P1001  for breast and pelvic exam.  Past medical history,surgical history, problem list, medications, allergies, family history and social history were all reviewed and documented as reviewed in the EPIC chart.  ROS:  Performed with pertinent positives and negatives included in the history, assessment and plan.   Additional significant findings :  none   Exam: [name] Vitals:   02/03/15 1451  BP: 120/74  Height: 5\' 5"  (1.651 m)  Weight: 132 lb (59.875 kg)   General appearance:  Normal affect, orientation and appearance. Skin: Grossly normal HEENT: Without gross lesions.  No cervical or supraclavicular adenopathy. Thyroid normal.  Lungs:  Clear without wheezing, rales or rhonchi Cardiac: RR, without RMG Abdominal:  Soft, nontender, without masses, guarding, rebound, organomegaly or hernia Breasts:  Examined lying and sitting. Right without masses, retractions, discharge or axillary adenopathy.  Left with old lumpectomy scarring otherwise without masses, retractions, discharge or adenopathy Pelvic:  Ext/BUS/vagina with atrophic changes  Cervix  With atrophic changes  Uterus anteverted, normal size, shape and contour, midline and mobile nontender   Adnexa  Without masses or tenderness    Anus and perineum  Normal   Rectovaginal  Normal sphincter tone without palpated masses or tenderness.    Assessment/Plan:  74 y.o. G7P1001 female for breast and pelvic exam.   1. Post menopausal/atrophic genital changes. No significant hot flushes, night sweats, vaginal dryness or any vaginal bleeding. Continue to monitor report any issues or vaginal bleeding. 2. Mild urethral prolapse. Asymptomatic to the patient and stable on exam. Continue to observe. 3. Osteopenia. Recent bone density last year at [facility] reportedly stable. She'll continue to follow up with them in reference to bone health is a her doing  her bone studies. Increased calcium MND reviewed. 4. Pap smear 2015. No Pap smear done today. 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 ASCUS negative high risk HPV 2007 and 2011. Negative Pap smears 4 since then. 5. Colonoscopy 2011. Repeat at their recommended interval. 6. Mammography 04/2014. History of left-sided breast cancer status post lumpectomy, chemotherapy and radiation 1992. Exam NED. Continue with annual mammography when due.  SBE monthly reviewed. 7. Health maintenance. No routine lab work done as she does this through her primary physician at [facility]. Follow up in one year, sooner as needed.   Anna Auerbach MD, 3:09 PM 02/03/2015

## 2015-02-03 NOTE — Patient Instructions (Signed)

## 2015-03-23 DIAGNOSIS — Z0100 Encounter for examination of eyes and vision without abnormal findings: Secondary | ICD-10-CM | POA: Diagnosis not present

## 2015-03-23 DIAGNOSIS — H353121 Nonexudative age-related macular degeneration, left eye, early dry stage: Secondary | ICD-10-CM | POA: Diagnosis not present

## 2015-03-23 DIAGNOSIS — H2513 Age-related nuclear cataract, bilateral: Secondary | ICD-10-CM | POA: Diagnosis not present

## 2015-03-23 DIAGNOSIS — H353111 Nonexudative age-related macular degeneration, right eye, early dry stage: Secondary | ICD-10-CM | POA: Diagnosis not present

## 2015-04-21 DIAGNOSIS — H25812 Combined forms of age-related cataract, left eye: Secondary | ICD-10-CM | POA: Diagnosis not present

## 2015-04-21 DIAGNOSIS — H2512 Age-related nuclear cataract, left eye: Secondary | ICD-10-CM | POA: Diagnosis not present

## 2015-04-22 ENCOUNTER — Other Ambulatory Visit: Payer: Self-pay | Admitting: *Deleted

## 2015-04-22 MED ORDER — TIOTROPIUM BROMIDE MONOHYDRATE 18 MCG IN CAPS: 18.0000 ug | ORAL_CAPSULE | Freq: Every day | RESPIRATORY_TRACT | Status: DC

## 2015-04-22 MED ORDER — BUDESONIDE-FORMOTEROL FUMARATE 160-4.5 MCG/ACT IN AERO: INHALATION_SPRAY | RESPIRATORY_TRACT | Status: DC

## 2015-05-17 ENCOUNTER — Encounter: Payer: Self-pay | Admitting: Emergency Medicine

## 2015-05-17 ENCOUNTER — Ambulatory Visit (INDEPENDENT_AMBULATORY_CARE_PROVIDER_SITE_OTHER): Payer: PPO | Admitting: Emergency Medicine

## 2015-05-17 VITALS — BP 112/66 | HR 92 | Ht 65.0 in | Wt 135.0 lb

## 2015-05-17 DIAGNOSIS — J449 Chronic obstructive pulmonary disease, unspecified: Secondary | ICD-10-CM | POA: Diagnosis not present

## 2015-05-17 DIAGNOSIS — Q385 Congenital malformations of palate, not elsewhere classified: Secondary | ICD-10-CM | POA: Diagnosis not present

## 2015-05-17 NOTE — Assessment & Plan Note (Signed)
Etiology unclear, irregular shape. I would like to have her baby weighted by ENT. She is going to have her husband look at this (a retired ENT) and let us know to whom she would like to be referred.

## 2015-05-17 NOTE — Assessment & Plan Note (Signed)
Continue current medications. Continue Symbicort with a spacer.

## 2015-05-17 NOTE — Progress Notes (Signed)
Subjective:    Patient ID: Anna York, female    DOB: 06-17-1940, 75 y.o.   MRN: [id number] HPI 75 yo woman, former smoker (20 pk-yrs), hx breast CA, cerebrovascular disease, ? ASD (hasn't had TEE, TTE on 09/04/11 did not see it, PASP estimated 30-70mHg), HTN. She has been told that she had asthma in the past - seemed to be related to pet hair exposure, paint fumes. On these (rare) occasions, she responded well to albuterol. More recently she has been under eval by Dr Joylene Draft for exertional SOB. She recalls that her exertional tolerance has always been less than her peers, but now she has noticed that she has trouble with stairs, a brisk walk, especially if she is also talking. She reports occasional wheezing. Her CT scan chest 09/11/11 report is available - shows no PE, emphysematous changes. For this reason, a1-AT testing was done, not available yet.  She has stopped exercising about 2 yrs ago, has gained about 7 lbs.   ROV 10/18/11 -- f/u for chronic and episodic dyspnea, ? COPD with emphysematous change seen on imaging. Returns today after PFT >> moderately severe AFL, no BD response, hyperinflated RV, decreased DLCO that corrects for Va. She tried the albuterol and didn't notice very much. Her a-AT testing was normal.    PULMONARY FUNCTON TEST 10/18/2011  FVC 2.53  FEV1 1.03  FEV1/FVC 40.7  FVC  % Predicted 86  FEV % Predicted 49  FeF 25-75 .34  FeF 25-75 % Predicted 2.35    ROV 12/18/11 -- f/u for dyspnea, PFT show moderately severe obstruction. Last time we started Symbicort >> she can tell some improvement in her breathing. She has concerns about whether her lung fxn will continue to decline. She has used her SABA rarely. She had an AE right after she returned from [place] 9/13.   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 Returns today for regular f/u. Has been maintained on Symbicort bid. She has missed 1-2 days, but she restarted it because she heard some  wheeze w exertion and was worried that she needed it. She has barely ever used albuterol.  No AE since last visit.   ROV 12/23/12 -- f/u for COPD and emphysematous changes on CT.  She reports that she had CAP dx about 2 weeks ago. She was having sudden dyspnea, fatigue, some fever, minimal cough. Was treated with levaquin. She now back to baseline. She continues on symbicort, rare SABA use.  I don't have a copy of her CXR.  ROV 07/14/13 -- hx COPD and emphysematous changes on CT. She has been stable. She notes that walking an incline is very difficult. Carrying objects is difficult. She is able to do the work anyway.  No flares, no prednisone. No allergy troubles.   ROV 10/11/14 -- follows up for her COPD.  We did a trial of anoro last time, but she didn't see a benefit, went back to Symbicort.  She uses SABA rarely. She continues to have exertional SOB, has started to notice some wheezing. She has had an AE in may '16, was treated by Dr Joylene Draft.   ROV 11/10/14 -- follow-up visit for COPD. I last saw her one month ago. Last time we added Spiriva to symbicort. She feels that she has benefited significantly. She has noticed over the last several days that she has had a bit more   Cough, non-productive. She has noticed some slight increased exertional SOB. Uses albuterol rarely  and it helps her. Some slight increase in congestion.          ROV 05/17/15 --  With history of COPD who follows up today. She is currently managed on Spiriva and Symbicort.  She continues to have daily frequent cough, happens whenever she is dyspneic. Non-productive. Has some wheeze with exertion but not otherwise. No flares since last time.           Objective:   Physical Exam Filed Vitals:   05/17/15 1350 05/17/15 1351  BP:  112/66  Pulse:  92  Height: 5\' 5"  (1.651 m)   Weight: 135 lb (61.236 kg)   SpO2:  96%   Gen: Pleasant, well-nourished, in no distress,  normal affect  ENT: No lesions,  There is an irregularly shaped  lesion on her hard palate without any evidence of ulceration. Question whether this is scar or keloid  Neck: No JVD, no TMG, no carotid bruits  Lungs: No use of accessory muscles, very mild B exp wheezes.   Cardiovascular: RRR, heart sounds normal, no murmur or gallops, no peripheral edema  Musculoskeletal: No deformities, no cyanosis or clubbing  Neuro: alert, non focal  Skin: Warm, no lesions or rashes      Assessment & Plan:  Palate abnormality Etiology unclear, irregular shape. I would like to have her baby weighted by ENT. She is going to have her husband look at this (a retired ENT) and let us know to whom she would like to be referred.   COPD (chronic obstructive pulmonary disease) Continue current medications. Continue Symbicort with a spacer.

## 2015-05-17 NOTE — Patient Instructions (Signed)
Please continue your Spiriva and Symbicort as you have been taking them. Continue to use spacer with the symbicort.  Use albuterol as needed for cough or shortness of breath  We will refer you to see ENT regarding the roof of your mouth. Have your husband look at this and then let us know if you want to see a particular ENT in our community.  Follow with Dr Lamonte Sakai in 6 months or sooner if you have any problems

## 2015-06-02 DIAGNOSIS — E559 Vitamin D deficiency, unspecified: Secondary | ICD-10-CM | POA: Diagnosis not present

## 2015-06-02 DIAGNOSIS — I10 Essential (primary) hypertension: Secondary | ICD-10-CM | POA: Diagnosis not present

## 2015-06-06 DIAGNOSIS — D538 Other specified nutritional anemias: Secondary | ICD-10-CM | POA: Diagnosis not present

## 2015-06-06 DIAGNOSIS — D539 Nutritional anemia, unspecified: Secondary | ICD-10-CM | POA: Diagnosis not present

## 2015-06-09 DIAGNOSIS — M859 Disorder of bone density and structure, unspecified: Secondary | ICD-10-CM | POA: Diagnosis not present

## 2015-06-09 DIAGNOSIS — Z1389 Encounter for screening for other disorder: Secondary | ICD-10-CM | POA: Diagnosis not present

## 2015-06-09 DIAGNOSIS — E559 Vitamin D deficiency, unspecified: Secondary | ICD-10-CM | POA: Diagnosis not present

## 2015-06-09 DIAGNOSIS — C50919 Malignant neoplasm of unspecified site of unspecified female breast: Secondary | ICD-10-CM | POA: Diagnosis not present

## 2015-06-09 DIAGNOSIS — G458 Other transient cerebral ischemic attacks and related syndromes: Secondary | ICD-10-CM | POA: Diagnosis not present

## 2015-06-09 DIAGNOSIS — J449 Chronic obstructive pulmonary disease, unspecified: Secondary | ICD-10-CM | POA: Diagnosis not present

## 2015-06-09 DIAGNOSIS — Z0000 Encounter for general adult medical examination without abnormal findings: Secondary | ICD-10-CM | POA: Diagnosis not present

## 2015-06-09 DIAGNOSIS — I10 Essential (primary) hypertension: Secondary | ICD-10-CM | POA: Diagnosis not present

## 2015-06-09 DIAGNOSIS — K529 Noninfective gastroenteritis and colitis, unspecified: Secondary | ICD-10-CM | POA: Diagnosis not present

## 2015-06-09 DIAGNOSIS — Z6822 Body mass index (BMI) 22.0-22.9, adult: Secondary | ICD-10-CM | POA: Diagnosis not present

## 2015-06-09 DIAGNOSIS — D539 Nutritional anemia, unspecified: Secondary | ICD-10-CM | POA: Diagnosis not present

## 2015-06-09 DIAGNOSIS — J189 Pneumonia, unspecified organism: Secondary | ICD-10-CM | POA: Diagnosis not present

## 2015-07-01 DIAGNOSIS — Z1231 Encounter for screening mammogram for malignant neoplasm of breast: Secondary | ICD-10-CM | POA: Diagnosis not present

## 2015-07-01 DIAGNOSIS — Z853 Personal history of malignant neoplasm of breast: Secondary | ICD-10-CM | POA: Diagnosis not present

## 2015-07-04 DIAGNOSIS — H2000 Unspecified acute and subacute iridocyclitis: Secondary | ICD-10-CM | POA: Diagnosis not present

## 2015-07-08 ENCOUNTER — Encounter: Payer: Self-pay | Admitting: Gynecology

## 2015-07-29 ENCOUNTER — Telehealth: Payer: Self-pay | Admitting: Emergency Medicine

## 2015-07-29 MED ORDER — TIOTROPIUM BROMIDE MONOHYDRATE 18 MCG IN CAPS: 18.0000 ug | ORAL_CAPSULE | Freq: Every day | RESPIRATORY_TRACT | Status: DC

## 2015-07-29 NOTE — Telephone Encounter (Signed)
Pt is requesting refill of Spiriva sent to [facility]. Rx sent. Nothing further needed.

## 2015-08-29 ENCOUNTER — Other Ambulatory Visit: Payer: Self-pay | Admitting: Emergency Medicine

## 2015-09-19 ENCOUNTER — Telehealth: Payer: Self-pay | Admitting: Emergency Medicine

## 2015-09-19 MED ORDER — BUDESONIDE-FORMOTEROL FUMARATE 160-4.5 MCG/ACT IN AERO: INHALATION_SPRAY | RESPIRATORY_TRACT | 5 refills | Status: DC

## 2015-09-19 MED ORDER — TIOTROPIUM BROMIDE MONOHYDRATE 18 MCG IN CAPS: 18.0000 ug | ORAL_CAPSULE | Freq: Every day | RESPIRATORY_TRACT | 5 refills | Status: DC

## 2015-09-19 NOTE — Telephone Encounter (Signed)
Spoke with pt. She needs prescriptions for Spiriva and Symbicort sent to [facility]. These have been sent in. Nothing further was needed.

## 2015-11-16 ENCOUNTER — Encounter: Payer: Self-pay | Admitting: Emergency Medicine

## 2015-11-16 ENCOUNTER — Ambulatory Visit (INDEPENDENT_AMBULATORY_CARE_PROVIDER_SITE_OTHER): Payer: PPO | Admitting: Emergency Medicine

## 2015-11-16 DIAGNOSIS — J449 Chronic obstructive pulmonary disease, unspecified: Secondary | ICD-10-CM | POA: Diagnosis not present

## 2015-11-16 DIAGNOSIS — Q385 Congenital malformations of palate, not elsewhere classified: Secondary | ICD-10-CM

## 2015-11-16 NOTE — Progress Notes (Signed)
Subjective:    Patient ID: Anna York, female    DOB: [birth year], 75 y.o.   MRN: [id number] HPI 75 yo woman, former smoker (20 pk-yrs), hx breast CA, cerebrovascular disease, ? ASD (hasn't had TEE, TTE on 09/04/11 did not see it, PASP estimated 30-75mHg), HTN. She has been told that she had asthma in the past - seemed to be related to pet hair exposure, paint fumes. On these (rare) occasions, she responded well to albuterol. More recently she has been under eval by Dr Joylene Draft for exertional SOB. She recalls that her exertional tolerance has always been less than her peers, but now she has noticed that she has trouble with stairs, a brisk walk, especially if she is also talking. She reports occasional wheezing. Her CT scan chest 09/11/11 report is available - shows no PE, emphysematous changes. For this reason, a1-AT testing was done, not available yet.  She has stopped exercising about 2 yrs ago, has gained about 7 lbs.   ROV 74/29/13 -- f/u for chronic and episodic dyspnea, ? COPD with emphysematous change seen on imaging. Returns today after PFT >> moderately severe AFL, no BD response, hyperinflated RV, decreased DLCO that corrects for Va. She tried the albuterol and didn't notice very much. Her a-AT testing was normal.    PULMONARY FUNCTON TEST 10/18/2011  FVC 2.53  FEV1 1.03  FEV1/FVC 40.7  FVC  % Predicted 86  FEV % Predicted 49  FeF 25-75 .34  FeF 25-75 % Predicted 2.35    ROV 12/18/11 -- f/u for dyspnea, PFT show moderately severe obstruction. Last time we started Symbicort >> she can tell some improvement in her breathing. She has concerns about whether her lung fxn will continue to decline. She has used her SABA rarely. She had an AE right after she returned from [place] 9/13.   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 Returns today for regular f/u. Has been maintained on Symbicort bid. She has missed 1-2 days, but she restarted it because she heard some  wheeze w exertion and was worried that she needed it. She has barely ever used albuterol.  No AE since last visit.   ROV 12/23/12 -- f/u for COPD and emphysematous changes on CT.  She reports that she had CAP dx about 2 weeks ago. She was having sudden dyspnea, fatigue, some fever, minimal cough. Was treated with levaquin. She now back to baseline. She continues on symbicort, rare SABA use.  I don't have a copy of her CXR.  ROV 07/14/13 -- hx COPD and emphysematous changes on CT. She has been stable. She notes that walking an incline is very difficult. Carrying objects is difficult. She is able to do the work anyway.  No flares, no prednisone. No allergy troubles.   ROV 10/11/14 -- follows up for her COPD.  We did a trial of anoro last time, but she didn't see a benefit, went back to Symbicort.  She uses SABA rarely. She continues to have exertional SOB, has started to notice some wheezing. She has had an AE in may '16, was treated by Dr Joylene Draft.   ROV 11/10/14 -- follow-up visit for COPD. I last saw her one month ago. Last time we added Spiriva to symbicort. She feels that she has benefited significantly. She has noticed over the last several days that she has had a bit more   Cough, non-productive. She has noticed some slight increased exertional SOB. Uses albuterol rarely  and it helps her. Some slight increase in congestion.          ROV 05/17/15 --  With history of COPD who follows up today. She is currently managed on Spiriva and Symbicort.  She continues to have daily frequent cough, happens whenever she is dyspneic. Non-productive. Has some wheeze with exertion but not otherwise. No flares since last time.       ROV 11/16/15 -- Is a follow-up visit for obstructive lung disease as indicated by pulmonary function testing and emphysematous change on chest CT. Also notes that she has a history of a palatine lesion on exam. She states that her breathing is stable, about the same as before. Has been managed on  symbicort and spiriva. Uses albuterol occasionally.  No exacerbations since last time. She notes that the cost of Spiriva and Symbicort has been difficult       Objective:   Physical Exam Vitals:   11/16/15 1602  BP: 128/82  BP Location: Right Arm  Cuff Size: Normal  Pulse: 75  SpO2: 95%  Weight: 132 lb (59.9 kg)  Height: 5\' 5"  (1.651 m)   Gen: Pleasant, well-nourished, in no distress,  normal affect  ENT: No lesions,  There is an irregularly shaped lesion on her hard palate without any evidence of ulceration. Question whether this is scar or keloid  Neck: No JVD, no TMG, no carotid bruits  Lungs: No use of accessory muscles, very mild B exp wheezes.   Cardiovascular: RRR, heart sounds normal, no murmur or gallops, no peripheral edema  Musculoskeletal: No deformities, no cyanosis or clubbing  Neuro: alert, non focal  Skin: Warm, no lesions or rashes      Assessment & Plan:  COPD (chronic obstructive pulmonary disease) We will temporarily stop Spiriva and Symbicort  Try starting Stiolto 2 puffs daily for 1 month.  Call us to report your status on the new medication We will refill your albuterol today.  Get your flu shot this Fall Follow with Dr Lamonte Sakai in 6 months or sooner if you have any problems  Palate abnormality This was evaluated by ENT and was found to be a benign lesion  Baltazar Apo, MD, PhD 11/16/2015, 4:21 PM [facility] [place] [phone] or if no answer [phone]

## 2015-11-16 NOTE — Assessment & Plan Note (Signed)
This was evaluated by ENT and was found to be a benign lesion

## 2015-11-16 NOTE — Assessment & Plan Note (Signed)
We will temporarily stop Spiriva and Symbicort  Try starting Stiolto 2 puffs daily for 1 month.  Call us to report your status on the new medication We will refill your albuterol today.  Get your flu shot this Fall Follow with Dr Lamonte Sakai in 6 months or sooner if you have any problems

## 2015-11-16 NOTE — Patient Instructions (Signed)
We will temporarily stop Spiriva and Symbicort  Try starting Stiolto 2 puffs daily for 1 month.  Call us to report your status on the new medication We will refill your albuterol today.  Get your flu shot this Fall Follow with Dr Lamonte Sakai in 6 months or sooner if you have any problems

## 2015-11-17 ENCOUNTER — Telehealth: Payer: Self-pay | Admitting: Emergency Medicine

## 2015-11-17 MED ORDER — ALBUTEROL SULFATE HFA 108 (90 BASE) MCG/ACT IN AERS: 2.0000 | INHALATION_SPRAY | RESPIRATORY_TRACT | 3 refills | Status: DC | PRN

## 2015-11-17 MED ORDER — ALBUTEROL SULFATE HFA 108 (90 BASE) MCG/ACT IN AERS: 2.0000 | INHALATION_SPRAY | Freq: Four times a day (QID) | RESPIRATORY_TRACT | 3 refills | Status: DC | PRN

## 2015-11-17 NOTE — Telephone Encounter (Signed)
Called [facility]. Pt is there waiting for Rx for the albuterol inhaler. I have sent this to them. Nothing further needed

## 2015-11-17 NOTE — Addendum Note (Signed)
Addended by: Maryanna Shape A on: 11/17/2015 01:55 PM   Modules accepted: Orders

## 2015-12-02 ENCOUNTER — Telehealth: Payer: Self-pay | Admitting: Emergency Medicine

## 2015-12-02 MED ORDER — TIOTROPIUM BROMIDE-OLODATEROL 2.5-2.5 MCG/ACT IN AERS
2.0000 | INHALATION_SPRAY | Freq: Every day | RESPIRATORY_TRACT | 5 refills | Status: DC
Start: 2015-12-02 — End: 2015-12-20

## 2015-12-02 NOTE — Telephone Encounter (Signed)
Called and spoke to pt. Pt states she would like an rx of Stiolto sent to pharmacy. Rx sent to preferred pharmacy. Pt verbalized understanding and denied any further questions or concerns at this time.

## 2015-12-03 DIAGNOSIS — Z23 Encounter for immunization: Secondary | ICD-10-CM | POA: Diagnosis not present

## 2015-12-19 ENCOUNTER — Telehealth: Payer: Self-pay | Admitting: Emergency Medicine

## 2015-12-19 NOTE — Telephone Encounter (Signed)
ATC, NA and no option to leave msg, WCB

## 2015-12-20 NOTE — Telephone Encounter (Signed)
Spoke with pt, states that she has switched herself back to symbicort and spiriva from stiolto- states that stiolto does not help with her symptoms.  Pt states she has medications at home and does not need a new rx at this time.  Forwarding to [place] as FYI.

## 2015-12-30 ENCOUNTER — Encounter: Payer: Self-pay | Admitting: *Deleted

## 2016-01-31 ENCOUNTER — Ambulatory Visit (INDEPENDENT_AMBULATORY_CARE_PROVIDER_SITE_OTHER): Payer: PPO | Admitting: Internal Medicine

## 2016-01-31 ENCOUNTER — Encounter: Payer: Self-pay | Admitting: Internal Medicine

## 2016-01-31 VITALS — BP 134/76 | HR 84 | Ht 63.75 in | Wt 133.4 lb

## 2016-01-31 DIAGNOSIS — K52832 Lymphocytic colitis: Secondary | ICD-10-CM

## 2016-01-31 DIAGNOSIS — K5900 Constipation, unspecified: Secondary | ICD-10-CM | POA: Diagnosis not present

## 2016-01-31 NOTE — Patient Instructions (Signed)
Continue Benefiber.  Discontinue Lialda afterJanuary. If symptoms return, resume medication every other day (let us know if you need to do this).  Follow up with Dr Hilarie Fredrickson in 3 months.  If you are age 74 or older, your body mass index should be between 23-30. Your Body mass index is 23.07 kg/m. If this is out of the aforementioned range listed, please consider follow up with your Primary Care Provider.  If you are age 50 or younger, your body mass index should be between 19-25. Your Body mass index is 23.07 kg/m. If this is out of the aformentioned range listed, please consider follow up with your Primary Care Provider.

## 2016-01-31 NOTE — Progress Notes (Signed)
Patient ID: [name], female   DOB: 1940-08-20, 75 y.o.   MRN: [id number] HPI: Anna York is a 75 year old female with a past medical history of lymphocytic colitis, history of breast cancer, osteoarthritis who is seen to establish care with me for her history of microscopic colitis. She was previously followed by Dr. Sharlett Iles. Her last office visit was over 3 years ago in March 2014.  She was diagnosed with lymphocytic colitis at colonoscopy in October 2011. Prior to this she been having issue with lower abdominal pain via. Colonoscopy was performed on 12/05/2009. It showed mild left colonic diverticular changes but no polyps. Biopsies consistent with microscopic colitis. She has been maintained on Lialda previously at 2.4 g daily. She has weaned the dose because she wasn't having symptoms. She decreased to 1.2 g daily then 1.2 g every other day than 1.2 g every third day. She has returned to every other day dosing because she had noticed some mild crampy discomfort at night. This is not occurring presently. Now she reports bowel habits as at times more constipated with stools thicken be hard and small. Occasionally there is incomplete evacuation. She's added Benefiber recently which seems to help. No blood in her stool or melena. Good appetite. No upper GI complaint including no dysphagia or odynophagia. No hepatobiliary complaint.  In September she had 2 days of a different type of abdominal pain. She states feeling more constipated at that time and having used milk of magnesia. This led to intense lower abdominal cramping which lasted less than 48 hours. It has not recurred.  Past Medical History:  Diagnosis Date  . ASCUS (atypical squamous cells of undetermined significance) on Pap smear    Neg high risk HPV  . Atrophic vaginitis   . Backache, unspecified   . Breast cancer ([place]) 1992   Chemo and Radiation  . Constipation   . Cystitis, unspecified   . Diverticulosis   .  Dysphagia   . Headache(784.0)   . Lymphocytic colitis   . Osteoarthritis   . Osteopenia 2012   T score -2.1  . Osteoporosis   . Palpitations   . Personal history of colonic polyps 07/07/2002   hyperplastic  . Personal history of malignant neoplasm of breast   . TIA (transient ischemic attack)   . Unspecified transient cerebral ischemia     Past Surgical History:  Procedure Laterality Date  . BREAST BIOPSY    . BREAST LUMPECTOMY     chemo and radiation  . COLONOSCOPY  2011   diverticulosis  . COLPOSCOPY    . FACIAL COSMETIC SURGERY    . LYMPHADENECTOMY  1993  . TONSILLECTOMY      Outpatient Medications Prior to Visit  Medication Sig Dispense Refill  . albuterol (PROVENTIL HFA;VENTOLIN HFA) 108 (90 Base) MCG/ACT inhaler Inhale 2 puffs into the lungs every 6 (six) hours as needed. 1 Inhaler 3  . budesonide-formoterol (SYMBICORT) 160-4.5 MCG/ACT inhaler INHALE 2 PUFFS INTO THE LUNGS TWICE DAILY 10.2 g 5  . Cholecalciferol (VITAMIN D-400 PO) Take 1 tablet by mouth daily. 200 daily    . clopidogrel (PLAVIX) 75 MG tablet Take 75 mg by mouth daily.      . mesalamine (LIALDA) 1.2 G EC tablet PLEASE TAKE ONE TABLETS BY MOUTH every other day    . Metoprolol Succinate (TOPROL XL PO) Take 12.5 mg by mouth daily.     Marland Kitchen tiotropium (SPIRIVA HANDIHALER) 18 MCG inhalation capsule Place 1 capsule (18 mcg total) into inhaler  and inhale daily. 30 capsule 5  . Vaginal Lubricant (REPLENS) GEL Use as directed      No facility-administered medications prior to visit.     Allergies  Allergen Reactions  . Erythromycin     REACTION: nausea  . Macrobid [Nitrofurantoin]     REACTION: hives and rash    Family History  Problem Relation Age of Onset  . Breast cancer Mother     Age 5  . Hypertension Father     Social History  Substance Use Topics  . Smoking status: Former Smoker    Types: Cigarettes    Quit date: 02/20/2000  . Smokeless tobacco: Never Used  . Alcohol use 0.0 oz/week      Comment: Occas.    ROS: As per history of present illness, otherwise negative  BP 134/76 (BP Location: Right Arm, Patient Position: Sitting, Cuff Size: Normal)   Pulse 84   Ht 5' 3.75" (1.619 m) Comment: height measured without shoes  Wt 133 lb 6 oz (60.5 kg)   BMI 23.07 kg/m  Constitutional: Well-developed and well-nourished. No distress. HEENT: Normocephalic and atraumatic. Oropharynx is clear and moist. No oropharyngeal exudate. Conjunctivae are normal.  No scleral icterus. Neck: Neck supple. Trachea midline. Cardiovascular: Normal rate, regular rhythm and intact distal pulses.  Pulmonary/chest: Effort normal and breath sounds normal. No wheezing, rales or rhonchi. Abdominal: Soft, nontender, nondistended. Bowel sounds active throughout. There are no masses palpable. No hepatosplenomegaly. Extremities: no clubbing, cyanosis, or edema Lymphadenopathy: No cervical adenopathy noted. Neurological: Alert and oriented to person place and time. Skin: Skin is warm and dry. No rashes noted. Psychiatric: Normal mood and affect. Behavior is normal.  ASSESSMENT/PLAN: 75 year old female with a past medical history of lymphocytic colitis, history of breast cancer, osteoarthritis who is seen to establish care with me for her history of microscopic colitis.  1. Microscopic colitis -- long-standing remission and she is on extremely low-dose of mesalamine. I not sure it is providing any benefit for this disease process. We discussed that lymphocytic colitis can go into remission for reasons that are not overtly clear. I am okay trying her off of all therapy. She would like to wait until after Christmas. Plan: Discontinue Lialda altogether after the new year. If she develops recurrent lower abdominal discomfort or diarrhea she is asked to notify me and we would resume therapy. Currently she is using 1.2 g every other day.  2. Mild constipation -- significant improvement with Benefiber. I encouraged her  to continue this daily. I provided reassurance that this was safe for long-term use. The episode of more intense lower abdominal cramping possibly could have been mild ischemic or segmental colitis in the setting of constipation and laxative use. I asked that she notify me if this recurs. She voices understanding  3. CRC screening -- average risk. No polyps as of October 2011. Consider repeat in October 2021 though at that point we may discontinue screening based on her age. We will discuss this more in the future based on her overall health.  I recommended 3 month return to make sure she is doing well off of therapy otherwise as needed   [id number] Joylene Draft, [address] [place], [place] [id number]

## 2016-02-29 DIAGNOSIS — Z6822 Body mass index (BMI) 22.0-22.9, adult: Secondary | ICD-10-CM | POA: Diagnosis not present

## 2016-02-29 DIAGNOSIS — J441 Chronic obstructive pulmonary disease with (acute) exacerbation: Secondary | ICD-10-CM | POA: Diagnosis not present

## 2016-02-29 DIAGNOSIS — J449 Chronic obstructive pulmonary disease, unspecified: Secondary | ICD-10-CM | POA: Diagnosis not present

## 2016-02-29 DIAGNOSIS — R05 Cough: Secondary | ICD-10-CM | POA: Diagnosis not present

## 2016-03-01 ENCOUNTER — Encounter: Payer: Medicare Other | Admitting: Gynecology

## 2016-03-27 DIAGNOSIS — H524 Presbyopia: Secondary | ICD-10-CM | POA: Diagnosis not present

## 2016-03-27 DIAGNOSIS — H26492 Other secondary cataract, left eye: Secondary | ICD-10-CM | POA: Diagnosis not present

## 2016-03-28 ENCOUNTER — Telehealth: Payer: Self-pay | Admitting: *Deleted

## 2016-03-28 MED ORDER — LEVOFLOXACIN 500 MG PO TABS: 500.0000 mg | ORAL_TABLET | Freq: Every day | ORAL | 1 refills | Status: DC

## 2016-03-28 NOTE — Telephone Encounter (Signed)
Okay for Levaquin 500 mg #3 one by mouth daily with one refill

## 2016-03-28 NOTE — Telephone Encounter (Signed)
Pt had to reschedule her annual due to the flu, now scheduled for 04/19/16, Dr.Gottsegen had prescribed patient Levaquin 500 mg #3 tablet 1 po daily for IC. Pt said he would typically give her about 2-3 refills. Pt said she feels a flare up approaching. Please advise

## 2016-03-28 NOTE — Telephone Encounter (Signed)
Pt informed, Rx sent. 

## 2016-04-19 ENCOUNTER — Encounter: Payer: Self-pay | Admitting: Gynecology

## 2016-04-19 ENCOUNTER — Ambulatory Visit (INDEPENDENT_AMBULATORY_CARE_PROVIDER_SITE_OTHER): Payer: PPO | Admitting: Gynecology

## 2016-04-19 VITALS — BP 120/76 | Ht 65.0 in | Wt 131.0 lb

## 2016-04-19 DIAGNOSIS — Z124 Encounter for screening for malignant neoplasm of cervix: Secondary | ICD-10-CM

## 2016-04-19 DIAGNOSIS — N952 Postmenopausal atrophic vaginitis: Secondary | ICD-10-CM

## 2016-04-19 DIAGNOSIS — M8580 Other specified disorders of bone density and structure, unspecified site: Secondary | ICD-10-CM

## 2016-04-19 DIAGNOSIS — Z01411 Encounter for gynecological examination (general) (routine) with abnormal findings: Secondary | ICD-10-CM | POA: Diagnosis not present

## 2016-04-19 DIAGNOSIS — N368 Other specified disorders of urethra: Secondary | ICD-10-CM

## 2016-04-19 NOTE — Patient Instructions (Signed)

## 2016-04-19 NOTE — Progress Notes (Signed)
    Anna York 1940-07-18 [id number]        75 y.o.  G1P1001 for breast and pelvic exam  Past medical history,surgical history, problem list, medications, allergies, family history and social history were all reviewed and documented as reviewed in the EPIC chart.  ROS:  Performed with pertinent positives and negatives included in the history, assessment and plan.   Additional significant findings :  None   Exam: [name] assistant Vitals:   04/19/16 1402  BP: 120/76  Weight: 131 lb (59.4 kg)  Height: 5\' 5"  (1.651 m)   Body mass index is 21.8 kg/m.  General appearance:  Normal affect, orientation and appearance. Skin: Grossly normal HEENT: Without gross lesions.  No cervical or supraclavicular adenopathy. Thyroid normal.  Lungs:  Clear without wheezing, rales or rhonchi Cardiac: RR, without RMG Abdominal:  Soft, nontender, without masses, guarding, rebound, organomegaly or hernia Breasts:  Examined lying and sitting without masses, retractions, discharge or axillary adenopathy.  Well-healed left lumpectomy scar Pelvic:  Ext, BUS, Vagina: With atrophic changes. Mild urethral prolapse noted.  Cervix: With atrophic changes  Uterus: Anteverted, normal size, shape and contour, midline and mobile nontender   Adnexa: Without masses or tenderness    Anus and perineum: Normal   Rectovaginal: Normal sphincter tone without palpated masses or tenderness.    Assessment/Plan:  76 y.o. G29P1001 female for annual exam.   1. Postmenopausal/atrophic genital changes.  No significant hot flushes, night sweats, vaginal dryness or any bleeding. Continue to monitor report any issues or bleeding. 2. Mild urethral prolapse. Asymptomatic to the patient and stable on exam. 3. Osteopenia. Has DEXA scheduled through Dr. Silvestre Mesi office coming up. Will follow up with them in reference to this. 4. Pap smear 2015. Pap smear done today. 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 ASCUS with  negative high-risk HPV 2007/2011. Negative Pap smears since then. 5. Mammography 06/2015. Continue with annual mammography this coming May. History of left breast cancer status post lumpectomy, chemotherapy and radiation. Exam NED. SBE monthly reviewed. 6. Colonoscopy 2011. Repeat at their recommended interval. 7. Health maintenance. No routine lab work done as patient does this elsewhere. Follow up 1 year, sooner as needed.  [name] MD, 2:18 PM 04/19/2016

## 2016-04-19 NOTE — Addendum Note (Signed)
Addended by: Nelva Nay on: 04/19/2016 02:57 PM   Modules accepted: Orders

## 2016-04-23 LAB — PAP IG W/ RFLX HPV ASCU

## 2016-05-16 ENCOUNTER — Ambulatory Visit (INDEPENDENT_AMBULATORY_CARE_PROVIDER_SITE_OTHER): Payer: PPO | Admitting: Emergency Medicine

## 2016-05-16 ENCOUNTER — Encounter: Payer: Self-pay | Admitting: Emergency Medicine

## 2016-05-16 DIAGNOSIS — J449 Chronic obstructive pulmonary disease, unspecified: Secondary | ICD-10-CM | POA: Diagnosis not present

## 2016-05-16 NOTE — Patient Instructions (Addendum)
Please continue your Spiriva and Symbicort as you have been taking them  Keep albuterol available to use 2 puffs up to every 4 hours if needed for shortness of breath.  Flu shot up to date.  Follow with Dr Lamonte Sakai in 6 months or sooner if you have any problems

## 2016-05-16 NOTE — Assessment & Plan Note (Signed)
Exacerbation this winter but currently approaching baseline. She has had some slight increased dyspnea and slight increased albuterol use. I do not hear any wheezing on exam today. We will can continue her current regimen. I reinforced with her that she needs to follow-up, call me if her albuterol use increases, pattern changes.

## 2016-05-16 NOTE — Progress Notes (Signed)
Subjective:    Patient ID: Anna York, female    DOB: 08/08/1940, 76 y.o.   MRN: [id number] HPI 76 yo woman, former smoker (20 pk-yrs), hx breast CA, cerebrovascular disease, ? ASD (hasn't had TEE, TTE on 09/04/11 did not see it, PASP estimated 30-29mHg), HTN. She has been told that she had asthma in the past - seemed to be related to pet hair exposure, paint fumes. On these (rare) occasions, she responded well to albuterol. More recently she has been under eval by Dr Joylene Draft for exertional SOB. She recalls that her exertional tolerance has always been less than her peers, but now she has noticed that she has trouble with stairs, a brisk walk, especially if she is also talking. She reports occasional wheezing. Her CT scan chest 09/11/11 report is available - shows no PE, emphysematous changes. For this reason, a1-AT testing was done, not available yet.  She has stopped exercising about 2 yrs ago, has gained about 7 lbs.   ROV 75/29/13 -- f/u for chronic and episodic dyspnea, ? COPD with emphysematous change seen on imaging. Returns today after PFT >> moderately severe AFL, no BD response, hyperinflated RV, decreased DLCO that corrects for Va. She tried the albuterol and didn't notice very much. Her a-AT testing was normal.    PULMONARY FUNCTON TEST 10/18/2011  FVC 2.53  FEV1 1.03  FEV1/FVC 40.7  FVC  % Predicted 86  FEV % Predicted 49  FeF 25-75 .34  FeF 25-75 % Predicted 2.35    ROV 11/16/15 -- Is a follow-up visit for obstructive lung disease as indicated by pulmonary function testing and emphysematous change on chest CT. Also notes that she has a history of a palatine lesion on exam. She states that her breathing is stable, about the same as before. Has been managed on symbicort and spiriva. Uses albuterol occasionally.  No exacerbations since last time. She notes that the cost of Spiriva and Symbicort has been difficult  ROV 05/16/16 -- Anna York follows up today for her history of  COPD, emphysema. At our last visit in September we tried changing her bronchodilators to [facility]. She did not feel that the Stiolto was as beneficial and so she is now back on Symbicort plus Spiriva. She had a URI since our last visit, was treated with pred + abx in January by Dr Joylene Draft. In aftermath she had some persistent cough. Now improved. She is close to her baseline currently - she has had some need for her albuterol over thelast 2 weeks.       Objective:   Physical Exam Vitals:   05/16/16 1334  BP: 126/76  Pulse: 84  SpO2: 95%  Weight: 131 lb 6.4 oz (59.6 kg)  Height: 5\' 5"  (1.651 m)   Gen: Pleasant, well-nourished, in no distress,  normal affect  ENT: No lesions,  There is an irregularly shaped lesion on her hard palate without any evidence of ulceration (has been evaluated, benign)  Neck: No JVD, no TMG, no carotid bruits  Lungs: No use of accessory muscles, very mild B exp wheezes.   Cardiovascular: RRR, heart sounds normal, no murmur or gallops, no peripheral edema  Musculoskeletal: No deformities, no cyanosis or clubbing  Neuro: alert, non focal  Skin: Warm, no lesions or rashes      Assessment & Plan:  COPD (chronic obstructive pulmonary disease) Exacerbation this winter but currently approaching baseline. She has had some slight increased dyspnea and slight increased albuterol use. I do not hear  any wheezing on exam today. We will can continue her current regimen. I reinforced with her that she needs to follow-up, call me if her albuterol use increases, pattern changes.   Baltazar Apo, MD, PhD 05/16/2016, 1:49 PM [facility] [phone] or if no answer [phone]

## 2016-05-28 ENCOUNTER — Ambulatory Visit: Payer: PPO | Admitting: Internal Medicine

## 2016-06-20 DIAGNOSIS — D539 Nutritional anemia, unspecified: Secondary | ICD-10-CM | POA: Diagnosis not present

## 2016-06-20 DIAGNOSIS — M859 Disorder of bone density and structure, unspecified: Secondary | ICD-10-CM | POA: Diagnosis not present

## 2016-06-20 DIAGNOSIS — E559 Vitamin D deficiency, unspecified: Secondary | ICD-10-CM | POA: Diagnosis not present

## 2016-06-20 DIAGNOSIS — I10 Essential (primary) hypertension: Secondary | ICD-10-CM | POA: Diagnosis not present

## 2016-06-20 DIAGNOSIS — D538 Other specified nutritional anemias: Secondary | ICD-10-CM | POA: Diagnosis not present

## 2016-06-27 DIAGNOSIS — R011 Cardiac murmur, unspecified: Secondary | ICD-10-CM | POA: Diagnosis not present

## 2016-06-27 DIAGNOSIS — M25511 Pain in right shoulder: Secondary | ICD-10-CM | POA: Diagnosis not present

## 2016-06-27 DIAGNOSIS — R05 Cough: Secondary | ICD-10-CM | POA: Diagnosis not present

## 2016-06-27 DIAGNOSIS — I10 Essential (primary) hypertension: Secondary | ICD-10-CM | POA: Diagnosis not present

## 2016-06-27 DIAGNOSIS — Z0000 Encounter for general adult medical examination without abnormal findings: Secondary | ICD-10-CM | POA: Diagnosis not present

## 2016-06-27 DIAGNOSIS — M859 Disorder of bone density and structure, unspecified: Secondary | ICD-10-CM | POA: Diagnosis not present

## 2016-06-27 DIAGNOSIS — Z1389 Encounter for screening for other disorder: Secondary | ICD-10-CM | POA: Diagnosis not present

## 2016-06-27 DIAGNOSIS — J449 Chronic obstructive pulmonary disease, unspecified: Secondary | ICD-10-CM | POA: Diagnosis not present

## 2016-06-27 DIAGNOSIS — D538 Other specified nutritional anemias: Secondary | ICD-10-CM | POA: Diagnosis not present

## 2016-06-27 DIAGNOSIS — G458 Other transient cerebral ischemic attacks and related syndromes: Secondary | ICD-10-CM | POA: Diagnosis not present

## 2016-06-27 DIAGNOSIS — C50919 Malignant neoplasm of unspecified site of unspecified female breast: Secondary | ICD-10-CM | POA: Diagnosis not present

## 2016-06-27 DIAGNOSIS — Z6821 Body mass index (BMI) 21.0-21.9, adult: Secondary | ICD-10-CM | POA: Diagnosis not present

## 2016-07-03 DIAGNOSIS — Z1231 Encounter for screening mammogram for malignant neoplasm of breast: Secondary | ICD-10-CM | POA: Diagnosis not present

## 2016-07-03 DIAGNOSIS — R921 Mammographic calcification found on diagnostic imaging of breast: Secondary | ICD-10-CM | POA: Diagnosis not present

## 2016-07-03 DIAGNOSIS — Z853 Personal history of malignant neoplasm of breast: Secondary | ICD-10-CM | POA: Diagnosis not present

## 2016-07-04 ENCOUNTER — Encounter: Payer: Self-pay | Admitting: Gynecology

## 2016-07-09 DIAGNOSIS — M19011 Primary osteoarthritis, right shoulder: Secondary | ICD-10-CM | POA: Diagnosis not present

## 2016-07-18 ENCOUNTER — Encounter: Payer: Self-pay | Admitting: Gynecology

## 2016-07-18 ENCOUNTER — Other Ambulatory Visit: Payer: Self-pay | Admitting: Radiology

## 2016-07-18 DIAGNOSIS — D0591 Unspecified type of carcinoma in situ of right breast: Secondary | ICD-10-CM | POA: Diagnosis not present

## 2016-07-18 DIAGNOSIS — R599 Enlarged lymph nodes, unspecified: Secondary | ICD-10-CM | POA: Diagnosis not present

## 2016-07-18 DIAGNOSIS — D0511 Intraductal carcinoma in situ of right breast: Secondary | ICD-10-CM | POA: Diagnosis not present

## 2016-07-20 ENCOUNTER — Telehealth: Payer: Self-pay | Admitting: *Deleted

## 2016-07-20 NOTE — Telephone Encounter (Signed)
Confirmed [facility] for 07/25/16 at 0815 .  Instructions and contact information given.

## 2016-07-24 ENCOUNTER — Other Ambulatory Visit: Payer: Self-pay | Admitting: *Deleted

## 2016-07-24 DIAGNOSIS — C801 Malignant (primary) neoplasm, unspecified: Secondary | ICD-10-CM

## 2016-07-24 DIAGNOSIS — Z170 Estrogen receptor positive status [ER+]: Principal | ICD-10-CM

## 2016-07-24 DIAGNOSIS — C50411 Malignant neoplasm of upper-outer quadrant of right female breast: Secondary | ICD-10-CM

## 2016-07-25 ENCOUNTER — Ambulatory Visit: Payer: PPO | Admitting: Physical Therapy

## 2016-07-25 ENCOUNTER — Ambulatory Visit
Admission: RE | Admit: 2016-07-25 | Discharge: 2016-07-25 | Disposition: A | Discharge status: Home / Self Care | Payer: PPO | Source: Ambulatory Visit | Attending: Radiation Oncology | Admitting: Radiation Oncology

## 2016-07-25 ENCOUNTER — Ambulatory Visit: Payer: PPO | Admitting: Oncology

## 2016-07-25 ENCOUNTER — Other Ambulatory Visit (HOSPITAL_BASED_OUTPATIENT_CLINIC_OR_DEPARTMENT_OTHER): Payer: PPO

## 2016-07-25 ENCOUNTER — Encounter: Payer: Self-pay | Admitting: Hematology and Oncology

## 2016-07-25 ENCOUNTER — Other Ambulatory Visit: Payer: Self-pay | Admitting: *Deleted

## 2016-07-25 ENCOUNTER — Other Ambulatory Visit: Payer: Self-pay | Admitting: Surgery

## 2016-07-25 ENCOUNTER — Encounter: Payer: Self-pay | Admitting: *Deleted

## 2016-07-25 ENCOUNTER — Ambulatory Visit (HOSPITAL_BASED_OUTPATIENT_CLINIC_OR_DEPARTMENT_OTHER): Payer: PPO | Admitting: Hematology and Oncology

## 2016-07-25 DIAGNOSIS — D0511 Intraductal carcinoma in situ of right breast: Secondary | ICD-10-CM | POA: Insufficient documentation

## 2016-07-25 DIAGNOSIS — M1990 Unspecified osteoarthritis, unspecified site: Secondary | ICD-10-CM | POA: Insufficient documentation

## 2016-07-25 DIAGNOSIS — D0591 Unspecified type of carcinoma in situ of right breast: Secondary | ICD-10-CM | POA: Diagnosis not present

## 2016-07-25 DIAGNOSIS — C50411 Malignant neoplasm of upper-outer quadrant of right female breast: Secondary | ICD-10-CM | POA: Insufficient documentation

## 2016-07-25 DIAGNOSIS — Z7901 Long term (current) use of anticoagulants: Secondary | ICD-10-CM | POA: Diagnosis not present

## 2016-07-25 DIAGNOSIS — Z8673 Personal history of transient ischemic attack (TIA), and cerebral infarction without residual deficits: Secondary | ICD-10-CM | POA: Insufficient documentation

## 2016-07-25 DIAGNOSIS — Z853 Personal history of malignant neoplasm of breast: Secondary | ICD-10-CM | POA: Insufficient documentation

## 2016-07-25 DIAGNOSIS — Z170 Estrogen receptor positive status [ER+]: Secondary | ICD-10-CM | POA: Insufficient documentation

## 2016-07-25 DIAGNOSIS — Z7902 Long term (current) use of antithrombotics/antiplatelets: Secondary | ICD-10-CM | POA: Insufficient documentation

## 2016-07-25 DIAGNOSIS — Z87891 Personal history of nicotine dependence: Secondary | ICD-10-CM | POA: Insufficient documentation

## 2016-07-25 DIAGNOSIS — C801 Malignant (primary) neoplasm, unspecified: Secondary | ICD-10-CM

## 2016-07-25 DIAGNOSIS — J449 Chronic obstructive pulmonary disease, unspecified: Secondary | ICD-10-CM | POA: Insufficient documentation

## 2016-07-25 DIAGNOSIS — K219 Gastro-esophageal reflux disease without esophagitis: Secondary | ICD-10-CM | POA: Insufficient documentation

## 2016-07-25 DIAGNOSIS — Z79899 Other long term (current) drug therapy: Secondary | ICD-10-CM | POA: Insufficient documentation

## 2016-07-25 DIAGNOSIS — Z881 Allergy status to other antibiotic agents status: Secondary | ICD-10-CM | POA: Insufficient documentation

## 2016-07-25 LAB — CBC WITH DIFFERENTIAL/PLATELET
BASO%: 1.3 % (ref 0.0–2.0)
BASOS ABS: 0.1 10*3/uL (ref 0.0–0.1)
EOS ABS: 0.3 10*3/uL (ref 0.0–0.5)
EOS%: 4.3 % (ref 0.0–7.0)
HEMATOCRIT: 43.6 % (ref 34.8–46.6)
HEMOGLOBIN: 14.6 g/dL (ref 11.6–15.9)
LYMPH#: 1.1 10*3/uL (ref 0.9–3.3)
LYMPH%: 16.1 % (ref 14.0–49.7)
MCH: 32 pg (ref 25.1–34.0)
MCHC: 33.5 g/dL (ref 31.5–36.0)
MCV: 95.5 fL (ref 79.5–101.0)
MONO#: 0.7 10*3/uL (ref 0.1–0.9)
MONO%: 10.3 % (ref 0.0–14.0)
NEUT%: 68 % (ref 38.4–76.8)
NEUTROS ABS: 4.7 10*3/uL (ref 1.5–6.5)
Platelets: 242 10*3/uL (ref 145–400)
RBC: 4.57 10*6/uL (ref 3.70–5.45)
RDW: 14.1 % (ref 11.2–14.5)
WBC: 7 10*3/uL (ref 3.9–10.3)

## 2016-07-25 LAB — COMPREHENSIVE METABOLIC PANEL
ALBUMIN: 4.3 g/dL (ref 3.5–5.0)
ALK PHOS: 60 U/L (ref 40–150)
ALT: 16 U/L (ref 0–55)
AST: 18 U/L (ref 5–34)
Anion Gap: 12 mEq/L — ABNORMAL HIGH (ref 3–11)
BILIRUBIN TOTAL: 0.46 mg/dL (ref 0.20–1.20)
BUN: 17.8 mg/dL (ref 7.0–26.0)
CALCIUM: 10.2 mg/dL (ref 8.4–10.4)
CO2: 24 mEq/L (ref 22–29)
Chloride: 106 mEq/L (ref 98–109)
Creatinine: 0.9 mg/dL (ref 0.6–1.1)
EGFR: 61 mL/min/{1.73_m2} — AB (ref >90)
Glucose: 103 mg/dl (ref 70–140)
POTASSIUM: 4.3 meq/L (ref 3.5–5.1)
Sodium: 141 mEq/L (ref 136–145)
TOTAL PROTEIN: 7 g/dL (ref 6.4–8.3)

## 2016-07-25 LAB — DRAW EXTRA CLOT TUBE

## 2016-07-25 NOTE — Progress Notes (Signed)
Nutrition Assessment  Reason for Assessment:  Pt seen in Breast Clinic  ASSESSMENT:   76 year old female with new diagnosis of right breast cancer. Past medical history reviewed lumpectomy and removal of lymph-nodes (left side) in 1993.    Patient reports normal appetite  Medications:  reviewed  Labs: reviewed  Anthropometrics:   Height: 65.5 inches Weight: 129 lb BMI: 21.3   NUTRITION DIAGNOSIS: Food and nutrition related knowledge deficit related to new diagnosis of breast cancer as evidenced by no prior need for nutrition related information.  INTERVENTION:   Discussed and provided packet of information regarding nutritional tips for breast cancer patients.  Questions answered.  Teachback method used.  Contact information provided and patient knows to contact me with questions/concerns.    MONITORING, EVALUATION, and GOAL: Pt will consume a healthy plant based diet to maintain lean body mass throughout treatment.   Latisha Lasch B. Zenia Resides, [place], [place] Registered Dietitian [phone] (pager)

## 2016-07-25 NOTE — Progress Notes (Signed)
Clinical Social Work [name]  Patient completed distress screening protocol and scored an 8 on the Psychosocial Distress Thermometer which indicates moderate distress. Clinical Social Worker met with patient and patients family in [facility] to assess for distress and other psychosocial needs. Patient stated she was feeling overwhelmed but felt "better" after meeting with the treatment team and getting more information on her treatment plan. [name] and patient discussed common feeling and emotions when being diagnosed with cancer, and the importance of support during treatment. [name] informed patient of the support team and support services at [facility]. [name] provided contact information and encouraged patient to call with any questions or concerns.  ONCBCN DISTRESS SCREENING 07/25/2016  Screening Type Initial Screening  Distress experienced in past week (1-10) 8  Emotional problem type Nervousness/Anxiety;Adjusting to illness  Information Concerns Type Lack of info about diagnosis;Lack of info about treatment;Lack of info about complementary therapy choices;Lack of info about maintaining fitness     Johnnye Lana, MSW, LCSW, OSW-C Clinical Social Worker [facility] [phone]

## 2016-07-25 NOTE — Progress Notes (Signed)
Radiation Oncology         [phone] ________________________________  Name: Anna York MRN: [id number]  Date: 07/25/2016  DOB: March 12, 1940  VF:IEPPIR, Elta Guadeloupe, MD  [name], MD     REFERRING PHYSICIAN: [name], MD   DIAGNOSIS: There were no encounter diagnoses.   HISTORY OF PRESENT ILLNESS: Anna York is a 76 y.o. female seen in the multidisciplinary breast clinic for a new diagnosis of right breast cancer. The patient has a history of left breast cancer in 1993 that was treated with lumpectomy, radiotherapy and chemotherapy in sandwich fashion with Dr. Letitia Libra, Dr. Danny Lawless, and Dr. Leeanne Deed. She has been NED in the left breast since, and went for routine screening mammogram which revealed a cluster of calcifications which on ultrasound measured 4 mm on the left breast. 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 She comes today to discuss options of treatment for her cancer.    PREVIOUS RADIATION THERAPY: Yes   1993: the patient received radiotherapy to the left breast for about 6 weeks with Dr. Danny Lawless.   PAST MEDICAL HISTORY:  Past Medical History:  Diagnosis Date  . ASCUS (atypical squamous cells of undetermined significance) on Pap smear    Neg high risk HPV  . Atrophic vaginitis   . Backache, unspecified   . Breast cancer ([place]) 1992   Chemo and Radiation  . Constipation   . Cystitis, unspecified   . Diverticulosis   . Dysphagia   . Headache(784.0)   . Lymphocytic colitis   . Osteoarthritis   . Osteopenia 2012   T score -2.1  . Osteoporosis   . Palpitations   . Personal history of colonic polyps 07/07/2002   hyperplastic  . Personal history of malignant neoplasm of breast   . TIA (transient ischemic attack)   . Unspecified transient cerebral ischemia        PAST SURGICAL HISTORY: Past Surgical History:  Procedure Laterality Date  . BREAST BIOPSY    . BREAST  LUMPECTOMY     chemo and radiation  . CATARACT EXTRACTION    . COLONOSCOPY  2011   diverticulosis  . COLPOSCOPY    . FACIAL COSMETIC SURGERY    . LYMPHADENECTOMY  1993  . TONSILLECTOMY       FAMILY HISTORY:  Family History  Problem Relation Age of Onset  . Breast cancer Mother        Age 23  . Hypertension Father      SOCIAL HISTORY:  reports that she quit smoking about 16 years ago. Her smoking use included Cigarettes. She has never used smokeless tobacco. She reports that she drinks alcohol. She reports that she does not use drugs. The patient is married and her husband is a retired Editor, commissioning. She lives in [place].   ALLERGIES: Erythromycin and Macrobid [nitrofurantoin]   MEDICATIONS:  Current Outpatient Prescriptions  Medication Sig Dispense Refill  . albuterol (PROVENTIL HFA;VENTOLIN HFA) 108 (90 Base) MCG/ACT inhaler Inhale 2 puffs into the lungs every 6 (six) hours as needed. 1 Inhaler 3  . budesonide-formoterol (SYMBICORT) 160-4.5 MCG/ACT inhaler INHALE 2 PUFFS INTO THE LUNGS TWICE DAILY 10.2 g 5  . Cholecalciferol (VITAMIN D-400 PO) Take 1 tablet by mouth daily. 200 daily    . clopidogrel (PLAVIX) 75 MG tablet Take 75 mg by mouth daily.      . Cyanocobalamin (VITAMIN B-12 PO) Take 1 tablet by mouth daily.    . Metoprolol  Succinate (TOPROL XL PO) Take 12.5 mg by mouth daily.     Marland Kitchen tiotropium (SPIRIVA HANDIHALER) 18 MCG inhalation capsule Place 1 capsule (18 mcg total) into inhaler and inhale daily. 30 capsule 5  . Vaginal Lubricant (REPLENS) GEL Use as directed      No current facility-administered medications for this encounter.      REVIEW OF SYSTEMS: On review of systems, the patient reports that she is doing well overall. She denies any chest pain, shortness of breath, cough, fevers, chills, night sweats, unintended weight changes. She denies any bowel or bladder disturbances, and denies abdominal pain, nausea or vomiting. She denies any new  musculoskeletal or joint aches or pains. A complete review of systems is obtained and is otherwise negative.     PHYSICAL EXAM:  Wt Readings from Last 3 Encounters:  05/16/16 131 lb 6.4 oz (59.6 kg)  04/19/16 131 lb (59.4 kg)  01/31/16 133 lb 6 oz (60.5 kg)   Temp Readings from Last 3 Encounters:  11/10/14 98 F (36.7 C) (Oral)  06/12/12 97.9 F (36.6 C) (Oral)  12/18/11 97.9 F (36.6 C) (Oral)   BP Readings from Last 3 Encounters:  05/16/16 126/76  04/19/16 120/76  01/31/16 134/76   Pulse Readings from Last 3 Encounters:  05/16/16 84  01/31/16 84  11/16/15 75    /10  In general this is a well appearing caucasian female in no acute distress. She is alert and oriented x4 and appropriate throughout the examination. HEENT reveals that the patient is normocephalic, atraumatic. EOMs are intact. PERRLA. Skin is intact without any evidence of gross lesions. Cardiovascular exam reveals a regular rate and rhythm, no clicks rubs or murmurs are auscultated. Chest is clear to auscultation bilaterally. Lymphatic assessment is performed and does not reveal any adenopathy in the cervical, supraclavicular, axillary, or inguinal chains. Bilateral breast exam is performed. The left reveals a previous lumpectomy scar without palpable abnormalities. The right breast reveals minimal ecchymosis of the right breast along her previous biopsy site. She does not have any nipple bleeding or discharge bilaterally. Abdomen has active bowel sounds in all quadrants and is intact. The abdomen is soft, non tender, non distended. Lower extremities are negative for pretibial pitting edema, deep calf tenderness, cyanosis or clubbing.   ECOG = 0  0 - Asymptomatic (Fully active, able to carry on all predisease activities without restriction)  1 - Symptomatic but completely ambulatory (Restricted in physically strenuous activity but ambulatory and able to carry out work of a light or sedentary nature. For example,  light housework, office work)  2 - Symptomatic, <50% in bed during the day (Ambulatory and capable of all self care but unable to carry out any work activities. Up and about more than 50% of waking hours)  3 - Symptomatic, >50% in bed, but not bedbound (Capable of only limited self-care, confined to bed or chair 50% or more of waking hours)  4 - Bedbound (Completely disabled. Cannot carry on any self-care. Totally confined to bed or chair)  5 - Death   Eustace Pen MM, Creech RH, Tormey DC, et al. [phone]). "Toxicity and response criteria of the [facility]". [place] Oncol. 5 (6): 649-55    LABORATORY DATA:  Lab Results  Component Value Date   WBC 5.3 11/01/2009   HGB 14.3 11/01/2009   HCT 42.1 11/01/2009   MCV 97.7 11/01/2009   PLT 330.0 11/01/2009   Lab Results  Component Value Date   NA  142 11/01/2009   K 5.0 11/01/2009   CL 103 11/01/2009   CO2 30 11/01/2009   Lab Results  Component Value Date   ALT 26 11/01/2009   AST 27 11/01/2009   ALKPHOS 45 11/01/2009   BILITOT 0.6 11/01/2009      RADIOGRAPHY: No results found.     IMPRESSION/PLAN: 1. 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 Dr. Lisbeth Renshaw discusses the pathology findings and reviews the nature of non invasive disease. The consensus from the breast conference include breast conservation with lumpectomy. Her course would then be followed by external radiotherapy to the breast followed by antiestrogen therapy given the intermediate grade of her tumor. We discussed the risks, benefits, short, and long term effects of radiotherapy, and the patient is interested in proceeding. Dr. Lisbeth Renshaw discusses the delivery and logistics of radiotherapy, and Dr. Lisbeth Renshaw recommends a course of 4 weeks of treatment. We will see her back about 2 weeks after surgery to move forward with the simulation and planning process and anticipate starting radiotherapy about 4 weeks after surgery.   2. Remote history of left breast cancer. The patient continues to be NED by screening, and will continue screening of the left breast annually. 3. Possible genetic predisposition to malignancy. The patient is counseled on the options for meeting with genetic counseling. She will consider this, but is not ready to commit to a referral for this.   The above documentation reflects my direct findings during this shared patient visit. Please see the separate note by Dr. Lisbeth Renshaw on this date for the remainder of the patient's plan of care.    Carola Rhine, PAC

## 2016-07-25 NOTE — Assessment & Plan Note (Signed)
07/18/2016: Right breast calcifications 4 mm size, biopsy revealed DCIS with calcifications low intermediate grade ER 100%, PR 50%, Tis Nx stage 0 (1993 breast cancer diagnosis treated with breast conserving surgery with radiation)  Pathology review: I discussed with the patient the difference between DCIS and invasive breast cancer. It is considered a precancerous lesion. DCIS is classified as a 0. It is generally detected through mammograms as calcifications. We discussed the significance of grades and its impact on prognosis. We also discussed the importance of ER and PR receptors and their implications to adjuvant treatment options. Prognosis of DCIS dependence on grade, comedo necrosis. It is anticipated that if not treated, 20-30% of DCIS can develop into invasive breast cancer.  Recommendation: 1. Breast conserving surgery 2. Followed by adjuvant radiation therapy 3. Followed by antiestrogen therapy with tamoxifen 5 years 4. We also recommended genetics consultation because of family history of breast cancer in the mom age 50  Tamoxifen counseling: We discussed the risks and benefits of tamoxifen. These include but not limited to insomnia, hot flashes, mood changes, vaginal dryness, and weight gain. Although rare, serious side effects including endometrial cancer, risk of blood clots were also discussed. We strongly believe that the benefits far outweigh the risks. Patient understands these risks and consented to starting treatment. Planned treatment duration is 5 years.  Return to clinic after surgery to discuss the final pathology report and come up with an adjuvant treatment plan.

## 2016-07-25 NOTE — Progress Notes (Signed)
[facility] NOTE  Patient Care Team: Crist Infante, MD as PCP - General (Internal Medicine) [name], MD as Consulting Physician (General Surgery) Magrinat, [name], MD as Consulting Physician (Oncology) Kyung Rudd, MD as Consulting Physician (Radiation Oncology)  CHIEF COMPLAINTS/PURPOSE OF CONSULTATION:  Newly diagnosed right breast DCIS  HISTORY OF PRESENTING ILLNESS:  Anna York 76 y.o. female is here because of recent diagnosis of right breast DCIS. Patient has a previous history of left breast cancer in 1991 which was treated with lumpectomy followed by radiation and chemotherapy. She had not taken any antiestrogen therapy although it was discussed at that time as a new treatment option for breast cancer. She had done extremely well. 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 She was presented this morning in the multidisciplinary tumor board and she is here today to discuss a treatment plan. She is accompanied by her husband who is a retired Engineer, drilling.  I reviewed her records extensively and collaborated the history with the patient.  SUMMARY OF ONCOLOGIC HISTORY:   Ductal carcinoma in situ (DCIS) of right breast   1993 Initial Biopsy    Breast cancer treated with breast conserving surgery followed by radiation      07/18/2016 Initial Diagnosis    Right breast calcifications 4 mm size, biopsy revealed DCIS with calcifications low intermediate grade ER 100%, PR 50%, Tis Nx stage 0      MEDICAL HISTORY:  Past Medical History:  Diagnosis Date  . ASCUS (atypical squamous cells of undetermined significance) on Pap smear    Neg high risk HPV  . Atrophic vaginitis   . Backache, unspecified   . Breast cancer ([place]) 1992   Chemo and Radiation  . Constipation   . Cystitis, unspecified   . Diverticulosis   . Dysphagia   .  Headache(784.0)   . Lymphocytic colitis   . Osteoarthritis   . Osteopenia 2012   T score -2.1  . Osteoporosis   . Palpitations   . Personal history of colonic polyps 07/07/2002   hyperplastic  . Personal history of malignant neoplasm of breast   . TIA (transient ischemic attack)   . Unspecified transient cerebral ischemia     SURGICAL HISTORY: Past Surgical History:  Procedure Laterality Date  . BREAST BIOPSY    . BREAST LUMPECTOMY     chemo and radiation  . CATARACT EXTRACTION    . COLONOSCOPY  2011   diverticulosis  . COLPOSCOPY    . FACIAL COSMETIC SURGERY    . LYMPHADENECTOMY  1993  . TONSILLECTOMY      SOCIAL HISTORY: Social History   Social History  . Marital status: Married    Spouse name: N/A  . Number of children: 1  . Years of education: N/A   Occupational History  . realtor     [name]  .  [name]   Social History Main Topics  . Smoking status: Former Smoker    Types: Cigarettes    Quit date: 02/20/2000  . Smokeless tobacco: Never Used  . Alcohol use 0.0 oz/week     Comment: Occas.  . Drug use: No  . Sexual activity: Yes    Birth control/ protection: Post-menopausal     Comment: 1st intercourse 76 yo-Fewer than 5 partners   Other Topics Concern  . Not on file   Social History Narrative   Married  to Dr Ethelle Lyon    FAMILY HISTORY: Family History  Problem Relation Age of Onset  . Breast cancer Mother        Age 48  . Hypertension Father     ALLERGIES:  is allergic to erythromycin and macrobid [nitrofurantoin].  MEDICATIONS:  Current Outpatient Prescriptions  Medication Sig Dispense Refill  . albuterol (PROVENTIL HFA;VENTOLIN HFA) 108 (90 Base) MCG/ACT inhaler Inhale 2 puffs into the lungs every 6 (six) hours as needed. 1 Inhaler 3  . budesonide-formoterol (SYMBICORT) 160-4.5 MCG/ACT inhaler INHALE 2 PUFFS INTO THE LUNGS TWICE DAILY 10.2 g 5  . Cholecalciferol (VITAMIN D-400 PO) Take 1 tablet by mouth daily. 200 daily    .  clopidogrel (PLAVIX) 75 MG tablet Take 75 mg by mouth daily.      . Cyanocobalamin (VITAMIN B-12 PO) Take 1 tablet by mouth daily.    . Metoprolol Succinate (TOPROL XL PO) Take 12.5 mg by mouth daily.     . pravastatin (PRAVACHOL) 20 MG tablet Take 20 mg by mouth See admin instructions. Every third day per pt    . tiotropium (SPIRIVA HANDIHALER) 18 MCG inhalation capsule Place 1 capsule (18 mcg total) into inhaler and inhale daily. 30 capsule 5  . Vaginal Lubricant (REPLENS) GEL Use as directed      No current facility-administered medications for this visit.     REVIEW OF SYSTEMS:   Constitutional: Denies fevers, chills or abnormal night sweats Eyes: Denies blurriness of vision, double vision or watery eyes Ears, nose, mouth, throat, and face: Denies mucositis or sore throat Respiratory: Denies cough, dyspnea or wheezes Cardiovascular: Denies palpitation, chest discomfort or lower extremity swelling Gastrointestinal:  Denies nausea, heartburn or change in bowel habits Skin: Denies abnormal skin rashes Lymphatics: Denies new lymphadenopathy or easy bruising Neurological:Denies numbness, tingling or new weaknesses Behavioral/Psych: Mood is stable, no new changes  Breast:Postbiopsy changes All other systems were reviewed with the patient and are negative.  PHYSICAL EXAMINATION: ECOG PERFORMANCE STATUS: 1 - Symptomatic but completely ambulatory  Vitals:   07/25/16 0852  BP: (!) 146/79  Pulse: 87  Resp: 18  Temp: 97.6 F (36.4 C)   Filed Weights   07/25/16 0852  Weight: 129 lb 12.8 oz (58.9 kg)    GENERAL:alert, no distress and comfortable SKIN: skin color, texture, turgor are normal, no rashes or significant lesions EYES: normal, conjunctiva are pink and non-injected, sclera clear OROPHARYNX:no exudate, no erythema and lips, buccal mucosa, and tongue normal  NECK: supple, thyroid normal size, non-tender, without nodularity LYMPH:  no palpable lymphadenopathy in the cervical,  axillary or inguinal LUNGS: clear to auscultation and percussion with normal breathing effort HEART: regular rate & rhythm and no murmurs and no lower extremity edema ABDOMEN:abdomen soft, non-tender and normal bowel sounds Musculoskeletal:no cyanosis of digits and no clubbing  PSYCH: alert & oriented x 3 with fluent speech NEURO: no focal motor/sensory deficits BREAST: No palpable nodules in breast. Bruising from prior biopsy. No palpable axillary or supraclavicular lymphadenopathy (exam performed in the presence of a chaperone)   LABORATORY DATA:  I have reviewed the data as listed Lab Results  Component Value Date   WBC 7.0 07/25/2016   HGB 14.6 07/25/2016   HCT 43.6 07/25/2016   MCV 95.5 07/25/2016   PLT 242 07/25/2016   Lab Results  Component Value Date   NA 141 07/25/2016   K 4.3 07/25/2016   CL 103 11/01/2009   CO2 24 07/25/2016    RADIOGRAPHIC STUDIES: I have  personally reviewed the radiological reports and agreed with the findings in the report.  ASSESSMENT AND PLAN:  Ductal carcinoma in situ (DCIS) of right breast 07/18/2016: Right breast calcifications 4 mm size, biopsy revealed DCIS with calcifications low intermediate grade ER 100%, PR 50%, Tis Nx stage 0 (1993 breast cancer diagnosis treated with breast conserving surgery with radiation)  Pathology review: I discussed with the patient the difference between DCIS and invasive breast cancer. It is considered a precancerous lesion. DCIS is classified as a 0. It is generally detected through mammograms as calcifications. We discussed the significance of grades and its impact on prognosis. We also discussed the importance of ER and PR receptors and their implications to adjuvant treatment options. Prognosis of DCIS dependence on grade, comedo necrosis. It is anticipated that if not treated, 20-30% of DCIS can develop into invasive breast cancer.  Recommendation: 1. Breast conserving surgery 2. Followed by Optional  adjuvant radiation therapy 3. Followed by antiestrogen therapy with tamoxifen 5 years 4. We also recommended genetics consultation because of family history of breast cancer in the mom age 22  Tamoxifen counseling: We discussed the risks and benefits of tamoxifen. These include but not limited to insomnia, hot flashes, mood changes, vaginal dryness, and weight gain. Although rare, serious side effects including endometrial cancer, risk of blood clots were also discussed. We strongly believe that the benefits far outweigh the risks. Patient understands these risks and consented to starting treatment. Planned treatment duration is 5 years.  Return to clinic after surgery to discuss the final pathology report and come up with an adjuvant treatment plan.  All questions were answered. The patient knows to call the clinic with any problems, questions or concerns.    Rulon Eisenmenger, MD 07/25/16

## 2016-07-26 ENCOUNTER — Ambulatory Visit: Payer: PPO | Attending: Hematology and Oncology | Admitting: Physical Therapy

## 2016-07-26 ENCOUNTER — Encounter: Payer: Self-pay | Admitting: Physical Therapy

## 2016-07-26 ENCOUNTER — Telehealth: Payer: Self-pay | Admitting: *Deleted

## 2016-07-26 DIAGNOSIS — M25511 Pain in right shoulder: Secondary | ICD-10-CM | POA: Diagnosis not present

## 2016-07-26 DIAGNOSIS — M6281 Muscle weakness (generalized): Secondary | ICD-10-CM

## 2016-07-26 DIAGNOSIS — M25611 Stiffness of right shoulder, not elsewhere classified: Secondary | ICD-10-CM

## 2016-07-26 NOTE — Telephone Encounter (Signed)
Left vm regarding [facility] from 6.6.18. Contact information provided.

## 2016-07-26 NOTE — Therapy (Signed)
[facility] [place], [place], [address] Phone: [phone]   Fax:  [phone]  Physical Therapy Evaluation  Patient Details  Name: Anna York MRN: [id number] Date of Birth: 09/15/1940 Referring Provider: Lindi Adie  Encounter Date: 07/26/2016      PT End of Session - 07/26/16 1713    Visit Number 1   Number of Visits 9   Date for PT Re-Evaluation 08/23/16   PT Start Time 1301   PT Stop Time 1345   PT Time Calculation (min) 44 min   Activity Tolerance Patient tolerated treatment well   Behavior During Therapy [facility] for tasks assessed/performed      Past Medical History:  Diagnosis Date  . ASCUS (atypical squamous cells of undetermined significance) on Pap smear    Neg high risk HPV  . Atrophic vaginitis   . Backache, unspecified   . Breast cancer ([place]) 1992   Chemo and Radiation  . Constipation   . Cystitis, unspecified   . Diverticulosis   . Dysphagia   . Headache(784.0)   . Lymphocytic colitis   . Osteoarthritis   . Osteopenia 2012   T score -2.1  . Osteoporosis   . Palpitations   . Personal history of colonic polyps 07/07/2002   hyperplastic  . Personal history of malignant neoplasm of breast   . TIA (transient ischemic attack)   . Unspecified transient cerebral ischemia     Past Surgical History:  Procedure Laterality Date  . BREAST BIOPSY    . BREAST LUMPECTOMY     chemo and radiation  . CATARACT EXTRACTION    . COLONOSCOPY  2011   diverticulosis  . COLPOSCOPY    . FACIAL COSMETIC SURGERY    . LYMPHADENECTOMY  1993  . TONSILLECTOMY      There were no vitals filed for this visit.       Subjective Assessment - 07/26/16 1303    Subjective My right shoulder is limited. It has been this way for years due to arthritis. I saw an orthopedic doctor and I could get a shot or get a shoulder replacement. I got a shot in the shoulder three weeks ago. It was wonderful for two days but  it didn't last for long. It's not as bad as it used to be. My shoulder has gotten progressively worse over the last 10 years.    Pertinent History recent diagnosis of right breast DCIS. Patient has a previous history of left breast cancer in 1991 which was treated with lumpectomy followed by radiation and chemotherapy. She had not taken any antiestrogen therapy although it was discussed at that time as a new treatment option for breast cancer. She had done extremely well. 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    Patient Stated Goals to get improved range of motion   Currently in Pain? No/denies            [facility] PT Assessment - 07/26/16 0001      Assessment   Medical Diagnosis right DCIS breast cancer   Referring Provider Gudena   Hand Dominance Right   Prior Therapy none     Precautions   Precautions Other (comment)  active cancer     Restrictions   Weight Bearing Restrictions No     Balance Screen   Has the patient fallen in the past 6 months No  Has the patient had a decrease in activity level because of a fear of falling?  No   Is the patient reluctant to leave their home because of a fear of falling?  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   Leisure walking occasionally     Cognition   Overall Cognitive Status Within Functional Limits for tasks assessed     ROM / Strength   AROM / PROM /  Strength AROM     AROM   AROM Assessment Site Shoulder   Right/Left Shoulder Right;Left   Right Shoulder Flexion 111 Degrees   Right Shoulder ABduction 75 Degrees   Right Shoulder Internal Rotation 31 Degrees   Right Shoulder External Rotation 35 Degrees   Left Shoulder Flexion 154 Degrees   Left Shoulder ABduction 175 Degrees   Left Shoulder Internal Rotation 68 Degrees   Left Shoulder External Rotation 90 Degrees           LYMPHEDEMA/ONCOLOGY QUESTIONNAIRE - 07/26/16 1322      Lymphedema Assessments   Lymphedema Assessments Upper extremities     Right Upper Extremity Lymphedema   15 cm Proximal to Olecranon Process 23.9 cm   Olecranon Process 22.2 cm   15 cm Proximal to Ulnar Styloid Process 20.5 cm   Just Proximal to Ulnar Styloid Process 14.1 cm   Across Hand at [employer] 19.6 cm   At [place] of 2nd Digit 5.5 cm     Left Upper Extremity Lymphedema   15 cm Proximal to Olecranon Process 22.8 cm   Olecranon Process 22.5 cm   15 cm Proximal to Ulnar Styloid Process 19.5 cm   Just Proximal to Ulnar Styloid Process 14 cm   Across Hand at [employer] 18.2 cm   At [place] of 2nd Digit 5.3 cm           Quick Dash - 07/26/16 0001    Open a tight or new jar No difficulty   Do heavy household chores (wash walls, wash floors) Severe difficulty   Carry a shopping bag or briefcase Severe difficulty   Wash your back Unable   Use a knife to cut food No difficulty   Recreational activities in which you take some force or impact through your arm, shoulder, or hand (golf, hammering, tennis) Severe difficulty   During the past week, to what extent has your arm, shoulder or hand problem interfered with your normal social activities with family, friends, neighbors, or groups? Not at all   During the past week, to what extent has your arm, shoulder or hand problem limited your work or other regular daily activities Modererately   Arm, shoulder, or hand pain. Moderate   Tingling  (pins and needles) in your arm, shoulder, or hand None   Difficulty Sleeping Mild difficulty   DASH Score 40.91 %      Objective measurements completed on examination: See above findings.          [place]  Adult PT Treatment/Exercise - 07/26/16 0001      Shoulder Exercises: Standing   Other Standing Exercises breast cancer post op exercises x 5 reps each with 3 sec holds                        [facility] Goals - 07/26/16 1728      CC Long Term Goal  #1   Title Pt will be able to independently verbalize lymphedema risk reduction practices   Time 4   Period Weeks   Status New     CC Long Term Goal  #2   Title Pt will be independent in a home exercise program for strengthening and stretching   Time 4   Period Weeks   Status New     CC Long Term Goal  #3   Title Pt will demonstrate 130 degrees of right shoulder flexion to allow her to reach items overhead and attain positioning for radiation   Baseline 111   Time 4   Period Weeks   Status New     CC Long Term Goal  #4   Title Pt to demonstrate 120 degrees of abduction to allow pt to reach out to her side and attain position necessary for radiation   Baseline 75   Time 4   Period Weeks   Status New             Plan - 07/26/16 1713    Clinical Impression Statement Pt presents to PT with recent diagnosis of right DCIS breast cancer. She will need to undergo a lumpectomy in the near future and states they may have to biopsy one lymph node. She also has a history of left breast cancer which was treated with a lumpectomy, chemo and radiation. She currently has very limited right shoulder ROM and if she requires radiation therapy she will have difficulty attaining the proper position. She states she has a long standing history (10 years) of arthritis in that shoulder and has not had full range of motion for a long time. Baseline circumferential measurements were taken of both arms if pt requires a lymph  node biopsied. Pt would benefit from skilled PT services to increase right shoulder ROM and strength to allow pt to attain position necessary for radiation and to allow her to use her RUE more. She is right handed and has become very reliant on her left UE due to pain and limitations in her right shoulder.    History and Personal Factors relevant to plan of care: pt is right handed, pt has history of right shoulder arthritis for 10 years   Clinical Presentation Evolving   Clinical Presentation due to: pt to undergo lumpectomy and possible radiation   Clinical Decision Making Moderate   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   PT Frequency 2x / week   PT Duration 4 weeks   PT Treatment/Interventions ADLs/Self Care Home Management;Therapeutic exercise;Patient/family education;Manual techniques;Passive range of motion;Moist Heat;Taping;Cryotherapy   PT Next Visit Plan give supine dowel exercises, gentle AAROM/PROM/AROM exercises that do not cause pain in right shoulder, e stim, issue lymphedema risk reduction handout since pt has past hx of cancer on left   PT Home Exercise Plan post op breast cancer exercises   Consulted and Agree with Plan of Care Patient      Patient will benefit from skilled therapeutic intervention in order to  improve the following deficits and impairments:  Pain, Impaired UE functional use, Decreased strength, Decreased range of motion  Visit Diagnosis: Stiffness of right shoulder, not elsewhere classified - Plan: PT plan of care cert/re-cert  Acute pain of right shoulder - Plan: PT plan of care cert/re-cert  Muscle weakness (generalized) - Plan: PT plan of care cert/re-cert      G-Codes - 25/42/70 1733    Functional Assessment Tool Used (Outpatient Only) Quick Dash   Functional Limitation Carrying, moving and handling objects   Carrying, Moving and Handling Objects Current Status  (W2376) At least 40 percent but less than 60 percent impaired, limited or restricted   Carrying, Moving and Handling Objects Goal Status (E8315) At least 20 percent but less than 40 percent impaired, limited or restricted       Problem List Patient Active Problem List   Diagnosis Date Noted  . Ductal carcinoma in situ (DCIS) of right breast 07/25/2016  . Palate abnormality 05/17/2015  . COPD (chronic obstructive pulmonary disease) ([place]) 10/04/2011  . Atrophic vaginitis   . Osteoporosis   . ASCUS (atypical squamous cells of undetermined significance) on Pap smear   . Cancer ([place])   . COLITIS 01/05/2010  . DIARRHEA 11/01/2009  . TIA 10/28/2009  . CONSTIPATION 10/28/2009  . CYSTITIS 10/28/2009  . OSTEOARTHRITIS 10/28/2009  . BACK PAIN 10/28/2009  . OSTEOPENIA 10/28/2009  . HEADACHE 10/28/2009  . PALPITATIONS 10/28/2009  . OTHER DYSPHAGIA 10/28/2009  . NEOPLASM, MALIGNANT, BREAST, HX OF 10/28/2009  . COLONIC POLYPS, HYPERPLASTIC, HX OF 10/28/2009    Allyson Sabal [facility] 07/26/2016, 5:37 PM  [place], [place], [address] Phone: [phone]   Fax:  [phone]  Name: Anna York MRN: [id number] Date of Birth: [birth year]   Manus Gunning, PT 07/26/16 5:38 PM

## 2016-07-27 ENCOUNTER — Other Ambulatory Visit: Payer: Self-pay | Admitting: Surgery

## 2016-07-31 ENCOUNTER — Ambulatory Visit: Payer: PPO | Admitting: Physical Therapy

## 2016-07-31 ENCOUNTER — Encounter: Payer: Self-pay | Admitting: Physical Therapy

## 2016-07-31 DIAGNOSIS — M25611 Stiffness of right shoulder, not elsewhere classified: Secondary | ICD-10-CM

## 2016-07-31 DIAGNOSIS — M25511 Pain in right shoulder: Secondary | ICD-10-CM

## 2016-07-31 DIAGNOSIS — M6281 Muscle weakness (generalized): Secondary | ICD-10-CM

## 2016-07-31 NOTE — Therapy (Signed)
[place], [place], [address] Phone: [phone]   Fax:  [phone]  Physical Therapy Treatment  Patient Details  Name: QUORRA ROSENE MRN: [id number] Date of Birth: [birth year] Referring Provider: Lindi Adie  Encounter Date: 07/31/2016      PT End of Session - 07/31/16 1424    Visit Number 2   Number of Visits 9   Date for PT Re-Evaluation 08/23/16   PT Start Time 8309   PT Stop Time 1417   PT Time Calculation (min) 44 min   Activity Tolerance Patient tolerated treatment well   Behavior During Therapy [facility] for tasks assessed/performed      Past Medical History:  Diagnosis Date  . ASCUS (atypical squamous cells of undetermined significance) on Pap smear    Neg high risk HPV  . Atrophic vaginitis   . Backache, unspecified   . Breast cancer ([place]) 1992   Chemo and Radiation  . Constipation   . Cystitis, unspecified   . Diverticulosis   . Dysphagia   . Headache(784.0)   . Lymphocytic colitis   . Osteoarthritis   . Osteopenia 2012   T score -2.1  . Osteoporosis   . Palpitations   . Personal history of colonic polyps 07/07/2002   hyperplastic  . Personal history of malignant neoplasm of breast   . TIA (transient ischemic attack)   . Unspecified transient cerebral ischemia     Past Surgical History:  Procedure Laterality Date  . BREAST BIOPSY    . BREAST LUMPECTOMY     chemo and radiation  . CATARACT EXTRACTION    . COLONOSCOPY  2011   diverticulosis  . COLPOSCOPY    . FACIAL COSMETIC SURGERY    . LYMPHADENECTOMY  1993  . TONSILLECTOMY      There were no vitals filed for this visit.      Subjective Assessment - 07/31/16 1337    Subjective My surgery is all scheduled for next week. I cancelled the rest of my appointments. I am not coming after today. If I have radiation it probably won't be until September. I want to be discharged after today.    Pertinent History recent  diagnosis of right breast DCIS. Patient has a previous history of left breast cancer in 1991 which was treated with lumpectomy followed by radiation and chemotherapy. She had not taken any antiestrogen therapy although it was discussed at that time as a new treatment option for breast cancer. She had done extremely well. 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    Patient Stated Goals to get improved range of motion   Currently in Pain? No/denies                         [facility] Adult PT Treatment/Exercise - 07/31/16 0001      Lumbar Exercises: Supine   Other Supine Lumbar Exercises pelvic tilts with 10 sec holds x 10 reps with max verbal cues to perform correctly     Knee/Hip Exercises: Supine   Bridges Strengthening;Both;1 set;10 reps  with blue theraband tied on inner thighs to decrease thigh p     Shoulder Exercises: Supine   Flexion AAROM;10 reps;Both  with dowel with 5-10 sec holds   ABduction AAROM;Right;10 reps  with dowel with 5 - 10 sec holds     Manual Therapy  Manual Therapy Passive ROM   Passive ROM gentle PROM to R shoulder in direction of flexion, abduction and ER- aduction most limited by joint discomfort and crepitus  gentle PROM to R shoulder in direction of flex, abd, ER                        Long Term Clinic Goals - 2016/08/26 1424      CC Long Term Goal  #1   Title Pt will be able to independently verbalize lymphedema risk reduction practices   Time 4   Period Weeks   Status Not Met     CC Long Term Goal  #2   Title Pt will be independent in a home exercise program for strengthening and stretching   Time 4   Period Weeks   Status Partially Met     CC Long Term Goal  #3   Title Pt will demonstrate 130 degrees of right shoulder flexion to allow her to reach items overhead and attain positioning for radiation    Baseline 111   Time 4   Period Weeks   Status Not Met     CC Long Term Goal  #4   Title Pt to demonstrate 120 degrees of abduction to allow pt to reach out to her side and attain position necessary for radiation   Baseline 75   Time 4   Period Weeks   Status Not Met            Plan - 08-26-16 1424    Clinical Impression Statement Patient requests to be discharged from skilled PT services today. 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 She does not wish to continue therapy at this time. Issued pt a home exercise program with AAROM for right shoulder and core strengthening exercises since pt had back pain during UE ROM exercises due to arching back. After pt was cued to keep core muscles engaged she did not have any more back pain.    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   PT Frequency 2x / week   PT Duration 4 weeks   PT Treatment/Interventions ADLs/Self Care Home Management;Therapeutic exercise;Patient/family education;Manual techniques;Passive range of motion;Moist Heat;Taping;Cryotherapy   PT Next Visit Plan d/c this visit   PT Home Exercise Plan post op breast cancer exercises, supine dowel exercises, bridges, pelvic tilts   Consulted and Agree with Plan of Care Patient      Patient will benefit from skilled therapeutic intervention in order to improve the following deficits and impairments:  Pain, Impaired UE functional use, Decreased strength, Decreased range of motion  Visit Diagnosis: Stiffness of right shoulder, not elsewhere classified  Acute pain of right shoulder  Muscle weakness (generalized)       G-Codes - Aug 26, 2016 1426    Functional Assessment Tool Used (Outpatient Only) clinical judgment   Functional Limitation Carrying, moving and handling objects    Carrying, Moving and Handling Objects Goal Status ([id number]) At least 20 percent but less than 40 percent impaired, limited or restricted   Carrying, Moving and Handling Objects Discharge Status [phone]) At least 40 percent but less than 60 percent impaired, limited or restricted      Problem List Patient Active Problem List   Diagnosis Date Noted  . Ductal  carcinoma in situ (DCIS) of right breast 07/25/2016  . Palate abnormality 05/17/2015  . COPD (chronic obstructive pulmonary disease) ([place]) 10/04/2011  . Atrophic vaginitis   . Osteoporosis   . ASCUS (atypical squamous cells of undetermined significance) on Pap smear   . Cancer ([place])   . COLITIS 01/05/2010  . DIARRHEA 11/01/2009  . TIA 10/28/2009  . CONSTIPATION 10/28/2009  . CYSTITIS 10/28/2009  . OSTEOARTHRITIS 10/28/2009  . BACK PAIN 10/28/2009  . OSTEOPENIA 10/28/2009  . HEADACHE 10/28/2009  . PALPITATIONS 10/28/2009  . OTHER DYSPHAGIA 10/28/2009  . NEOPLASM, MALIGNANT, BREAST, HX OF 10/28/2009  . 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  Patient goals were not met. Patient is being discharged due to the patient's request.  ?????     Allyson Sabal [place], [place] 07/31/16 2:33 PM

## 2016-07-31 NOTE — Patient Instructions (Signed)
Shoulder: Flexion (Supine)    With hands shoulder width apart, slowly lower dowel to floor behind head. Do not let elbows bend. Keep back flat. Hold _10-15___ seconds. Repeat _10___ times. Do __1__ sessions per day. CAUTION: Stretch slowly and gently.  Copyright  VHI. All rights reserved.  Shoulder: Abduction (Supine)    With right arm flat on floor, hold dowel in palm. Slowly move arm up to side of head by pushing with opposite arm. Do not let elbow bend. Hold _10-15___ seconds. Repeat _10___ times. Do _1__ sessions per day. CAUTION: Stretch slowly and gently.  Copyright  VHI. All rights reserved.   (Home) Flexion: Pelvic Tilt    Lie with neck supported, knees bent, feet flat. Tighten and suck stomach in, pushing back down against surface. Do not push down with legs. Repeat _10___ times per set. Do __1__ sets per session. Do _7___ sessions per week.  Copyright  VHI. All rights reserved.   Bridging    Slowly raise buttocks from floor, keeping stomach tight. Tie band around thighs and push outwards to avoid inner thigh discomfort.  Repeat _10___ times per set. Do _1___ sets per session. Do __1__ sessions per day.  http://orth.exer.us/1097   Copyright  VHI. All rights reserved.

## 2016-08-02 ENCOUNTER — Encounter: Payer: Self-pay | Admitting: Physical Therapy

## 2016-08-03 ENCOUNTER — Encounter: Payer: Self-pay | Admitting: Radiation Oncology

## 2016-08-07 ENCOUNTER — Ambulatory Visit: Payer: PPO | Admitting: Physical Therapy

## 2016-08-08 ENCOUNTER — Encounter (HOSPITAL_COMMUNITY): Payer: Self-pay | Admitting: *Deleted

## 2016-08-08 ENCOUNTER — Telehealth: Payer: Self-pay | Admitting: Hematology and Oncology

## 2016-08-08 DIAGNOSIS — D0591 Unspecified type of carcinoma in situ of right breast: Secondary | ICD-10-CM | POA: Diagnosis not present

## 2016-08-08 MED ORDER — GABAPENTIN 300 MG PO CAPS
300.0000 mg | ORAL_CAPSULE | ORAL | Status: AC
  Administered 2016-08-09: 300 mg via ORAL
  Filled 2016-08-08: qty 1

## 2016-08-08 MED ORDER — CEFAZOLIN SODIUM-DEXTROSE 2-4 GM/100ML-% IV SOLN
2.0000 g | INTRAVENOUS | Status: AC
  Administered 2016-08-09: 2 g via INTRAVENOUS
  Filled 2016-08-08: qty 100

## 2016-08-08 MED ORDER — ACETAMINOPHEN 500 MG PO TABS
1000.0000 mg | ORAL_TABLET | ORAL | Status: AC
  Administered 2016-08-09: 1000 mg via ORAL
  Filled 2016-08-08: qty 2

## 2016-08-08 NOTE — Progress Notes (Signed)
Spoke with pt for pre-op call. Pt denies cardiac history, chest pain or sob. Hx of Palpitations, takes Metoprolol. Denies history of diabetes.

## 2016-08-08 NOTE — H&P (Signed)
Anna York  Location: [facility] Patient #: [id number] DOB: [birth year] Unknown / Language: Anna York / Race: White Female  History of Present Illness   The patient is a 76 year old female who presents with a complaint of breast cancer.   The PCP is Dr. Jerilynn Mages. York.  The patient was referred by Dr. Jerilynn Mages. York.  The pateint is at the [facility] - Oncology is Drs. Anna York and Anna York comes with her husband Anna York  York has a history of left breast cancer around 73. York has done well from that breast cancer. York has gotten annual mammograms. York did not feel a mass in her breast. York is not on hormones.  Mammograms: [name] on 07/03/2016 - York had heterogenous calcifications in the right breast Biopsy: Right breast on 07/18/2016 ([id number]) - DCIS, low to intermediate grade, ER - 100%, PR = 50% Family history of breast or ovarian cancer: Personal history of left breast, UOQ, lumpect in 1993. On hormone therapy:  I discussed the options for breast cancer treatment with the patient. The patient is at the [facility], which includes medical oncology and radiation oncology. I discussed the surgical options of lumpectomy vs. mastectomy. If mastectomy, there is the possibility of reconstruction. I discussed the options of lymph node biopsy. The treatment plan depends on the pathologic staging of the tumor and the patient's personal wishes. The risks of surgery include, but are not limited to, bleeding, infection, the need for further surgery, and nerve injury. The patient has been given literature on the treatment of breast cancer.  Plan: 1) Genetics (York turned down?), 2) Anna York reviewed the films and thought that an MRI was "optional" - her breast density is "b", 3) Stop Plavix , 4) Right lumpectomy (seed loc) alone  Past Medical History: 1. Left breast cancer - Anna York, Anna York, and  Anna York Got chemotx, but no antiestrogen - has done well from this 2. HTN 3. Cerebrovascular disease History of TIA x 2 in 2011 4. COPD/asthma - followed by Dr. Baltazar York - used to smoke Uses inhalers daily get attacks from furry animals 5. History of lymphocytic colitis - followed by Anna York This is doing well right now 6. On Plavix - to stop this for now 7. had skipping heart rate - saw Anna York her cath was negative York stays on low dose of Metoprolol 8. Right shoulder problems York got a steroid injection - Dr. Onnie York  Social History: Spouse, Anna York (went to [employer]) York has a busy travel schedule   Past Surgical History Anna York; 07/25/2016 7:36 AM) Breast Biopsy  Right. Breast Mass; Local Excision  Left. Tonsillectomy   Diagnostic Studies History Anna York; 07/25/2016 7:36 AM) Colonoscopy  5-10 years ago Mammogram  within last year Pap Smear  1-5 years ago  Medication History Anna York; 07/25/2016 7:36 AM) Medications Reconciled  Social History Anna York; 07/25/2016 7:36 AM) Alcohol use  Moderate alcohol use. Caffeine use  Coffee. No drug use  Tobacco use  Former smoker.  Family History Anna York; 07/25/2016 7:36 AM) Breast Cancer  Mother. Cerebrovascular Accident  Mother. Hypertension  Father. Kidney Disease  Father.  Pregnancy / Birth History Anna York; 07/25/2016 7:36 AM) Age at menarche  62 years. Age of menopause  59-50 Contraceptive History  Oral contraceptives. Gravida  1 Maternal age  30-30 Para  1  Other Problems Anna York; 07/25/2016 7:36 AM) Arthritis  Asthma  Back  Pain  Breast Cancer  Cerebrovascular Accident  Chronic Obstructive Lung Disease  Emphysema Of Lung  Lump In Breast     Review of Systems Anna York; 07/25/2016 7:36 AM) General Not Present- Appetite Loss,  Chills, Fatigue, Fever, Night Sweats, Weight Gain and Weight Loss. Skin Not Present- Change in Wart/Mole, Dryness, Hives, Jaundice, New Lesions, Non-Healing Wounds, Rash and Ulcer. HEENT Not Present- Earache, Hearing Loss, Hoarseness, Nose Bleed, Oral Ulcers, Ringing in the Ears, Seasonal Allergies, Sinus Pain, Sore Throat, Visual Disturbances, Wears glasses/contact lenses and Yellow Eyes. Respiratory Not Present- Bloody sputum, Chronic Cough, Difficulty Breathing, Snoring and Wheezing. Breast Present- Breast Mass. Not Present- Breast Pain, Nipple Discharge and Skin Changes. Cardiovascular Present- Leg Cramps and Shortness of Breath. Not Present- Chest Pain, Difficulty Breathing Lying Down, Palpitations, Rapid Heart Rate and Swelling of Extremities. Gastrointestinal Not Present- Abdominal Pain, Bloating, Bloody Stool, Change in Bowel Habits, Chronic diarrhea, Constipation, Difficulty Swallowing, Excessive gas, Gets full quickly at meals, Hemorrhoids, Indigestion, Nausea, Rectal Pain and Vomiting. Female Genitourinary Not Present- Frequency, Nocturia, Painful Urination, Pelvic Pain and Urgency. Musculoskeletal Present- Back Pain. Not Present- Joint Pain, Joint Stiffness, Muscle Pain, Muscle Weakness and Swelling of Extremities. Neurological Not Present- Decreased Memory, Fainting, Headaches, Numbness, Seizures, Tingling, Tremor, Trouble walking and Weakness. Psychiatric Not Present- Anxiety, Bipolar, Change in Sleep Pattern, Depression, Fearful and Frequent crying. Endocrine Not Present- Cold Intolerance, Excessive Hunger, Hair Changes, Heat Intolerance, Hot flashes and New Diabetes. Hematology Present- Easy Bruising and Excessive bleeding. Not Present- Blood Thinners, Gland problems, HIV and Persistent Infections.  Physical Exam  General: WN older WFalert and generally healthy appearing. Skin: Inspection and palpation of the skin unremarkable.  Eyes: Conjunctivae white, pupils equal. Face,  ears, nose, mouth, and throat: Face - normal. Normal ears and nose. Lips and teeth normal.  Neck: Supple. No mass. Trachea midline. No thyroid mass.  Lymph Nodes: No supraclavicular or cervical adenopathy. No axillary adenopathy.  Lungs: Normal respiratory effort. Clear to auscultation and symmetric breath sounds. Cardiovascular: Regular rate and rythm. Normal auscultation of the heart. No murmur or rub.  Breasts: right - Right lateral breast bruise with mass Left - Left breast lumpectomy site, lateral to nipple. No mass or nodule.  Abdomen: Soft. No mass. Liver and spleen not palpable. No tenderness. No hernia. Normal bowel sounds.  Rectal: Not done.  Musculoskeletal/extremities: Normal gait. Good strength and ROM in upper and lower extremities.   Neurologic: Grossly intact to motor and sensory function.   Psychiatric: Has normal mood and affect. Judgement and insight appear normal.  Assessment & Plan  1.  BREAST CANCER, STAGE 0, RIGHT (D05.91)  Story: Mammograms: [name] on 07/03/2016 - York had heterogenous calcifications in the right breast  Biopsy: Right breast on 07/18/2016 ([id number]) - DCIS, low to intermediate grade, ER - 100%, PR = 50%  Oncology - [facility]  Plan:   1) Genetics (York turned down?),   2) Stop Plavix ,   3) Right lumpectomy (seed loc) alone  2.  HISTORY OF LEFT BREAST CANCER (Z85.3)  Story: Dxed in [place], and Anna [place]  Got chemotx, but no antiestrogen 3.  ANTICOAGULATED (Z79.01)  Impression: On plavix, will stop this 1 week ahead of surgery  4. HTN 5. Cerebrovascular disease History of TIA x 2 in 2011 6. COPD/asthma - followed by Dr. Baltazar York - used to smoke Uses inhalers daily get attacks from furry animals 7. History of lymphocytic colitis - followed by Dr. Lenna Sciara. Hilarie Fredrickson  This is doing well right now 8. had skipping heart rate - saw  Anna York her cath was negative York stays on low dose of Metoprolol 9. Right shoulder problems York got a steroid injection - Dr. Ahmed Prima, MD, [facility] Surgery Pager: [phone] Office phone:  [phone]

## 2016-08-08 NOTE — Telephone Encounter (Signed)
sw pt to confirm 7/5 appt at 1030 per sch msg

## 2016-08-09 ENCOUNTER — Encounter (HOSPITAL_COMMUNITY): Payer: Self-pay | Admitting: *Deleted

## 2016-08-09 ENCOUNTER — Ambulatory Visit (HOSPITAL_COMMUNITY): Payer: PPO | Admitting: Anesthesiology

## 2016-08-09 ENCOUNTER — Ambulatory Visit (HOSPITAL_COMMUNITY)
Admission: RE | Admit: 2016-08-09 | Discharge: 2016-08-09 | Disposition: A | Discharge status: Home / Self Care | Payer: PPO | Source: Ambulatory Visit | Attending: Surgery | Admitting: Surgery

## 2016-08-09 ENCOUNTER — Encounter (HOSPITAL_COMMUNITY)
Admission: RE | Disposition: A | Discharge status: Home / Self Care | Payer: Self-pay | Source: Ambulatory Visit | Attending: Surgery

## 2016-08-09 ENCOUNTER — Ambulatory Visit: Payer: PPO | Admitting: Physical Therapy

## 2016-08-09 DIAGNOSIS — J449 Chronic obstructive pulmonary disease, unspecified: Secondary | ICD-10-CM | POA: Diagnosis not present

## 2016-08-09 DIAGNOSIS — Z853 Personal history of malignant neoplasm of breast: Secondary | ICD-10-CM | POA: Diagnosis not present

## 2016-08-09 DIAGNOSIS — Z841 Family history of disorders of kidney and ureter: Secondary | ICD-10-CM | POA: Diagnosis not present

## 2016-08-09 DIAGNOSIS — Z87891 Personal history of nicotine dependence: Secondary | ICD-10-CM | POA: Diagnosis not present

## 2016-08-09 DIAGNOSIS — Z7901 Long term (current) use of anticoagulants: Secondary | ICD-10-CM | POA: Diagnosis not present

## 2016-08-09 DIAGNOSIS — C50911 Malignant neoplasm of unspecified site of right female breast: Secondary | ICD-10-CM | POA: Diagnosis not present

## 2016-08-09 DIAGNOSIS — Z8249 Family history of ischemic heart disease and other diseases of the circulatory system: Secondary | ICD-10-CM | POA: Diagnosis not present

## 2016-08-09 DIAGNOSIS — Z9221 Personal history of antineoplastic chemotherapy: Secondary | ICD-10-CM | POA: Insufficient documentation

## 2016-08-09 DIAGNOSIS — Z803 Family history of malignant neoplasm of breast: Secondary | ICD-10-CM | POA: Diagnosis not present

## 2016-08-09 DIAGNOSIS — Z9889 Other specified postprocedural states: Secondary | ICD-10-CM | POA: Diagnosis not present

## 2016-08-09 DIAGNOSIS — Z7951 Long term (current) use of inhaled steroids: Secondary | ICD-10-CM | POA: Diagnosis not present

## 2016-08-09 DIAGNOSIS — M1990 Unspecified osteoarthritis, unspecified site: Secondary | ICD-10-CM | POA: Insufficient documentation

## 2016-08-09 DIAGNOSIS — K219 Gastro-esophageal reflux disease without esophagitis: Secondary | ICD-10-CM | POA: Insufficient documentation

## 2016-08-09 DIAGNOSIS — G459 Transient cerebral ischemic attack, unspecified: Secondary | ICD-10-CM | POA: Diagnosis not present

## 2016-08-09 DIAGNOSIS — Z8673 Personal history of transient ischemic attack (TIA), and cerebral infarction without residual deficits: Secondary | ICD-10-CM | POA: Insufficient documentation

## 2016-08-09 DIAGNOSIS — Z79899 Other long term (current) drug therapy: Secondary | ICD-10-CM | POA: Diagnosis not present

## 2016-08-09 DIAGNOSIS — D0511 Intraductal carcinoma in situ of right breast: Secondary | ICD-10-CM | POA: Diagnosis not present

## 2016-08-09 DIAGNOSIS — R002 Palpitations: Secondary | ICD-10-CM | POA: Diagnosis not present

## 2016-08-09 DIAGNOSIS — I10 Essential (primary) hypertension: Secondary | ICD-10-CM | POA: Diagnosis not present

## 2016-08-09 DIAGNOSIS — D0591 Unspecified type of carcinoma in situ of right breast: Secondary | ICD-10-CM | POA: Diagnosis not present

## 2016-08-09 HISTORY — DX: Pneumonia, unspecified organism: J18.9

## 2016-08-09 HISTORY — DX: Unspecified asthma, uncomplicated: J45.909

## 2016-08-09 HISTORY — PX: BREAST LUMPECTOMY WITH RADIOACTIVE SEED LOCALIZATION: SHX6424

## 2016-08-09 HISTORY — DX: Gastro-esophageal reflux disease without esophagitis: K21.9

## 2016-08-09 HISTORY — DX: Cerebral infarction, unspecified: I63.9

## 2016-08-09 HISTORY — DX: Chronic obstructive pulmonary disease, unspecified: J44.9

## 2016-08-09 LAB — BASIC METABOLIC PANEL
Anion gap: 8 (ref 5–15)
BUN: 14 mg/dL (ref 6–20)
CO2: 23 mmol/L (ref 22–32)
Calcium: 9 mg/dL (ref 8.9–10.3)
Chloride: 105 mmol/L (ref 101–111)
Creatinine, Ser: 0.79 mg/dL (ref 0.44–1.00)
GFR calc Af Amer: >60 mL/min (ref >60)
GFR calc non Af Amer: >60 mL/min (ref >60)
Glucose, Bld: 103 mg/dL — ABNORMAL HIGH (ref 65–99)
Potassium: 3.8 mmol/L (ref 3.5–5.1)
Sodium: 136 mmol/L (ref 135–145)

## 2016-08-09 LAB — CBC
HCT: 42.6 % (ref 36.0–46.0)
Hemoglobin: 13.6 g/dL (ref 12.0–15.0)
MCH: 31.1 pg (ref 26.0–34.0)
MCHC: 31.9 g/dL (ref 30.0–36.0)
MCV: 97.3 fL (ref 78.0–100.0)
Platelets: 243 10*3/uL (ref 150–400)
RBC: 4.38 MIL/uL (ref 3.87–5.11)
RDW: 14.1 % (ref 11.5–15.5)
WBC: 6 10*3/uL (ref 4.0–10.5)

## 2016-08-09 SURGERY — BREAST LUMPECTOMY WITH RADIOACTIVE SEED LOCALIZATION
Anesthesia: General | Site: Breast | Laterality: Right

## 2016-08-09 MED ORDER — FENTANYL CITRATE (PF) 100 MCG/2ML IJ SOLN
INTRAMUSCULAR | Status: DC | PRN
Start: 2016-08-09 — End: 2016-08-09
  Administered 2016-08-09: 50 ug via INTRAVENOUS
  Administered 2016-08-09 (×2): 25 ug via INTRAVENOUS

## 2016-08-09 MED ORDER — PROPOFOL 10 MG/ML IV BOLUS
INTRAVENOUS | Status: DC | PRN
  Administered 2016-08-09: 120 mg via INTRAVENOUS
  Administered 2016-08-09 (×2): 20 mg via INTRAVENOUS

## 2016-08-09 MED ORDER — FENTANYL CITRATE (PF) 250 MCG/5ML IJ SOLN
INTRAMUSCULAR | Status: AC
  Filled 2016-08-09: qty 5

## 2016-08-09 MED ORDER — CHLORHEXIDINE GLUCONATE CLOTH 2 % EX PADS: 6.0000 | MEDICATED_PAD | Freq: Once | CUTANEOUS | Status: DC

## 2016-08-09 MED ORDER — EPHEDRINE SULFATE 50 MG/ML IJ SOLN
INTRAMUSCULAR | Status: DC | PRN
  Administered 2016-08-09: 10 mg via INTRAVENOUS

## 2016-08-09 MED ORDER — PROPOFOL 10 MG/ML IV BOLUS
INTRAVENOUS | Status: AC
  Filled 2016-08-09: qty 20

## 2016-08-09 MED ORDER — LACTATED RINGERS IV SOLN
INTRAVENOUS | Status: DC | PRN
  Administered 2016-08-09: 07:00:00 via INTRAVENOUS

## 2016-08-09 MED ORDER — HYDROCODONE-ACETAMINOPHEN 5-325 MG PO TABS: 1.0000 | ORAL_TABLET | Freq: Four times a day (QID) | ORAL | 0 refills | Status: DC | PRN

## 2016-08-09 MED ORDER — ONDANSETRON HCL 4 MG/2ML IJ SOLN
INTRAMUSCULAR | Status: DC | PRN
  Administered 2016-08-09: 4 mg via INTRAVENOUS

## 2016-08-09 MED ORDER — BUPIVACAINE-EPINEPHRINE 0.25% -1:200000 IJ SOLN
INTRAMUSCULAR | Status: DC | PRN
  Administered 2016-08-09: 30 mL

## 2016-08-09 MED ORDER — BUPIVACAINE-EPINEPHRINE (PF) 0.25% -1:200000 IJ SOLN
INTRAMUSCULAR | Status: AC
  Filled 2016-08-09: qty 30

## 2016-08-09 MED ORDER — 0.9 % SODIUM CHLORIDE (POUR BTL) OPTIME
TOPICAL | Status: DC | PRN
  Administered 2016-08-09: 1000 mL

## 2016-08-09 SURGICAL SUPPLY — 43 items
APPLIER CLIP 9.375 MED OPEN (MISCELLANEOUS) ×3
BINDER BREAST LRG (GAUZE/BANDAGES/DRESSINGS) ×3 IMPLANT
BINDER BREAST XLRG (GAUZE/BANDAGES/DRESSINGS) IMPLANT
BLADE SURG 15 STRL LF DISP TIS (BLADE) ×1 IMPLANT
BLADE SURG 15 STRL SS (BLADE) ×2
CANISTER SUCT 3000ML PPV (MISCELLANEOUS) ×3 IMPLANT
CHLORAPREP W/TINT 26ML (MISCELLANEOUS) ×3 IMPLANT
CLIP APPLIE 9.375 MED OPEN (MISCELLANEOUS) ×1 IMPLANT
CLIP TI WIDE RED SMALL 6 (CLIP) ×3 IMPLANT
COVER PROBE W GEL 5X96 (DRAPES) ×3 IMPLANT
COVER SURGICAL LIGHT HANDLE (MISCELLANEOUS) ×3 IMPLANT
DERMABOND ADVANCED (GAUZE/BANDAGES/DRESSINGS) ×2
DERMABOND ADVANCED .7 DNX12 (GAUZE/BANDAGES/DRESSINGS) ×1 IMPLANT
DEVICE DUBIN SPECIMEN MAMMOGRA (MISCELLANEOUS) ×3 IMPLANT
DRAPE CHEST BREAST 15X10 FENES (DRAPES) ×3 IMPLANT
DRAPE UTILITY XL STRL (DRAPES) ×3 IMPLANT
ELECT COATED BLADE 2.86 ST (ELECTRODE) ×3 IMPLANT
ELECT REM PT RETURN 9FT ADLT (ELECTROSURGICAL) ×3
ELECTRODE REM PT RTRN 9FT ADLT (ELECTROSURGICAL) ×1 IMPLANT
GAUZE SPONGE 4X4 12PLY STRL (GAUZE/BANDAGES/DRESSINGS) ×3 IMPLANT
GLOVE BIOGEL PI IND STRL 6.5 (GLOVE) ×1 IMPLANT
GLOVE BIOGEL PI INDICATOR 6.5 (GLOVE) ×2
GLOVE SURG SIGNA 7.5 PF LTX (GLOVE) ×6 IMPLANT
GLOVE SURG SS PI 6.5 STRL IVOR (GLOVE) ×3 IMPLANT
GOWN STRL REUS W/ TWL LRG LVL3 (GOWN DISPOSABLE) ×1 IMPLANT
GOWN STRL REUS W/ TWL XL LVL3 (GOWN DISPOSABLE) ×1 IMPLANT
GOWN STRL REUS W/TWL LRG LVL3 (GOWN DISPOSABLE) ×2
GOWN STRL REUS W/TWL XL LVL3 (GOWN DISPOSABLE) ×2
KIT BASIN OR (CUSTOM PROCEDURE TRAY) ×3 IMPLANT
KIT MARKER MARGIN INK (KITS) ×3 IMPLANT
NEEDLE HYPO 25GX1X1/2 BEV (NEEDLE) ×3 IMPLANT
NS IRRIG 1000ML POUR BTL (IV SOLUTION) ×3 IMPLANT
PACK SURGICAL SETUP 50X90 (CUSTOM PROCEDURE TRAY) ×3 IMPLANT
PENCIL BUTTON HOLSTER BLD 10FT (ELECTRODE) ×3 IMPLANT
SPONGE LAP 18X18 X RAY DECT (DISPOSABLE) ×3 IMPLANT
SUT MNCRL AB 4-0 PS2 18 (SUTURE) ×3 IMPLANT
SUT VIC AB 3-0 SH 8-18 (SUTURE) ×3 IMPLANT
SYR BULB 3OZ (MISCELLANEOUS) ×3 IMPLANT
SYR CONTROL 10ML LL (SYRINGE) ×3 IMPLANT
TOWEL OR 17X24 6PK STRL BLUE (TOWEL DISPOSABLE) ×3 IMPLANT
TUBE CONNECTING 12'X1/4 (SUCTIONS) ×1
TUBE CONNECTING 12X1/4 (SUCTIONS) ×2 IMPLANT
YANKAUER SUCT BULB TIP NO VENT (SUCTIONS) ×3 IMPLANT

## 2016-08-09 NOTE — Transfer of Care (Signed)
Immediate Anesthesia Transfer of Care Note  Patient: Anna York  Procedure(s) Performed: Procedure(s): RIGHT BREAST LUMPECTOMY WITH RADIOACTIVE SEED LOCALIZATION (Right)  Patient Location: PACU  Anesthesia Type:General  Level of Consciousness: awake, alert  and oriented  Airway & Oxygen Therapy: Patient Spontanous Breathing and Patient connected to nasal cannula oxygen  Post-op Assessment: Report given to RN, Post -op Vital signs reviewed and stable and Patient moving all extremities X 4  Post vital signs: Reviewed and stable  Last Vitals:  Vitals:   08/09/16 0624 08/09/16 0628  BP: (!) 174/74   Pulse: 76   Resp: 20   Temp:  36.7 C    Last Pain:  Vitals:   08/09/16 0628  TempSrc: Oral      Patients Stated Pain Goal: 2 (76/72/09 4709)  Complications: No apparent anesthesia complications

## 2016-08-09 NOTE — Op Note (Addendum)
08/09/2016  8:17 AM  PATIENT:  Anna York DOB: [birth year] MRN: [id number]  PREOP DIAGNOSIS:   RIGHT BREAST CANCER  POSTOP DIAGNOSIS:    Right breast cancer, 9 o'clock position (Tis, N0)  PROCEDURE:   Procedure(s): RIGHT BREAST LUMPECTOMY WITH RADIOACTIVE SEED LOCALIZATION,   SURGEON:   [name], M.D.  ANESTHESIA:   general  Anesthesiologist: Oleta Mouse, MD CRNA: Neldon Newport, CRNA  General  EBL:  minimal  ml  DRAINS:  none   LOCAL MEDICATIONS USED:   30 cc 1/4% marcaine  SPECIMEN:   Right breast lumpectomy  COUNTS CORRECT:  YES  INDICATIONS FOR PROCEDURE:  Anna York is a 76 y.o. (DOB: 1940-11-02) white female whose primary care physician is Crist Infante, MD and comes for right breast lumpectomy.   She was seen at the [facility] with Drs. [place].  She has a DCIS of the right breast biopsied at [facility].   The options for breast cancer treatment have been discussed with the patient. She elected to proceed with lumpectomy.  She does not need a lymph node biopsy.     The indications and potential complications of surgery were explained to the patient. Potential complications include, but are not limited to, bleeding, infection, the need for further surgery, and nerve injury.     She had a I131 seed placed on 08/08/2016 in her right breast at [facility].  I confirmed the presence of the I131 seed in the pre op area using the Neoprobe.  The seed is in the 3 o'clock position of the right breast.     OPERATIVE NOTE:   The patient was taken to OR room # 1 at [facility] where she underwent a general anesthesia  supervised by Anesthesiologist: Oleta Mouse, MD CRNA: Neldon Newport, CRNA. Her right breast and axilla were prepped with  ChloraPrep and sterilely draped.  We tried to protect her right shoulder, in which she is having some problems.   A time-out and the surgical check list was reviewed.    I turned attention  to the cancer which was about at the 9 o'clock position of the right breast.   I used the Neoprobe to identify the I131 seed.  I tried to excise an area around the tumor of at least 1 cm.    I excised this block of breast tissue approximately 4 cm by 4 cm  in diameter.   I painted the lumpectomy specimen with the 6 color paint kit and did a specimen mammogram which confirmed the mass, clip, and the seed were all in the right position in the specimen.  The specimen was sent to pathology who called back to confirm that they have the seed and the specimen.   I then irrigated the wound with saline. I infiltrated approximately 30 mL of 1/4% Marcaine in the incision. I placed 4 clips to mark biopsy cavity, at 12, 3, 6, and 9 o'clock.  I then closed the wound in layers using 3-0 Vicryl sutures for the deep layer. At the skin, I closed the incision with a 4-0 Monocryl suture. The incisions were then painted with Dermabond.  She had gauze place over the wounds and placed in a breast binder.   The patient tolerated the procedure well, was transported to the recovery room in good condition. Sponge and needle count were correct at the end of the case.   Final pathology is pending.   [name], MD, [facility]  Surgery Pager: [phone] Office phone:  [phone]

## 2016-08-09 NOTE — Addendum Note (Signed)
Addendum  created 08/09/16 1059 by Oleta Mouse, MD   Anesthesia Review and Sign - Ready for Procedure

## 2016-08-09 NOTE — Interval H&P Note (Signed)
History and Physical Interval Note:  08/09/2016 7:01 AM  Anna York  has presented today for surgery, with the diagnosis of RIGHT BREAST CANCER  The various methods of treatment have been discussed with the patient and family.   Husband at bedside.  After consideration of risks, benefits and other options for treatment, the patient has consented to  Procedure(s): BREAST LUMPECTOMY WITH RADIOACTIVE SEED LOCALIZATION (Right) as a surgical intervention .  The patient's history has been reviewed, patient examined, no change in status, stable for surgery.  I have reviewed the patient's chart and labs.  Questions were answered to the patient's satisfaction.     Lemon Sternberg H

## 2016-08-09 NOTE — Anesthesia Preprocedure Evaluation (Addendum)
Anesthesia Evaluation  Patient identified by MRN, date of birth, ID band Patient awake    Reviewed: Allergy & Precautions, NPO status , Patient's Chart, lab work & pertinent test results, reviewed documented beta blocker date and time   History of Anesthesia Complications Negative for: history of anesthetic complications  Airway Mallampati: I  TM Distance: >3 FB Neck ROM: Full    Dental  (+) Teeth Intact   Pulmonary asthma , pneumonia, COPD,  COPD inhaler, former smoker,    breath sounds clear to auscultation       Cardiovascular  Rhythm:Regular     Neuro/Psych  Headaches, TIACVA    GI/Hepatic GERD  Medicated and Controlled,  Endo/Other    Renal/GU      Musculoskeletal  (+) Arthritis ,   Abdominal   Peds  Hematology   Anesthesia Other Findings   Reproductive/Obstetrics                            Anesthesia Physical Anesthesia Plan  ASA: II  Anesthesia Plan: General   Post-op Pain Management:    Induction: Intravenous  PONV Risk Score and Plan:   Airway Management Planned: LMA  Additional Equipment: None  Intra-op Plan:   Post-operative Plan: Extubation in OR  Informed Consent: I have reviewed the patients History and Physical, chart, labs and discussed the procedure including the risks, benefits and alternatives for the proposed anesthesia with the patient or authorized representative who has indicated his/her understanding and acceptance.     Plan Discussed with: CRNA and Surgeon  Anesthesia Plan Comments:         Anesthesia Quick Evaluation

## 2016-08-09 NOTE — Anesthesia Postprocedure Evaluation (Signed)
Anesthesia Post Note  Patient: Anna York  Procedure(s) Performed: Procedure(s) (LRB): RIGHT BREAST LUMPECTOMY WITH RADIOACTIVE SEED LOCALIZATION (Right)     Patient location during evaluation: PACU Anesthesia Type: General Level of consciousness: awake and alert Pain management: pain level controlled Vital Signs Assessment: post-procedure vital signs reviewed and stable Respiratory status: spontaneous breathing, nonlabored ventilation, respiratory function stable and patient connected to nasal cannula oxygen Cardiovascular status: blood pressure returned to baseline and stable Postop Assessment: no signs of nausea or vomiting Anesthetic complications: no    Last Vitals:  Vitals:   08/09/16 0845 08/09/16 0850  BP:  (!) 168/69  Pulse: 77 68  Resp: 15 16  Temp: 36.1 C     Last Pain:  Vitals:   08/09/16 0845  TempSrc:   PainSc: 0-No pain                 Kymberlee Viger

## 2016-08-09 NOTE — Discharge Instructions (Signed)
[facility] - DISCHARGE INSTRUCTIONS TO PATIENT  Activity:  Driving - May drive in one to 2 days, if doing well   Lifting - No lifting more than 15 pounds for one week, then no limit  Wound Care:   Leave bandage on for 2 days - then remove and shower       Do not get in public water - lake, ocean, pool - until seen back in the office  Diet:  As tolerated  Follow up appointment:  Call Dr. Pollie Friar office [facility]) at [phone] for an appointment in 2 to 3 weeks.  Medications and dosages:  Resume your home medications.  You have a prescription for:  Vicodin             May resume the Plavix in 2 days - if not excessive bruising  Call Dr. Lucia Gaskins or his office  [phone]) if you have:  Temperature greater than 100.4,  Persistent nausea and vomiting,  Severe uncontrolled pain,  Redness, tenderness, or signs of infection (pain, swelling, redness, odor or green/yellow discharge around the site),  Difficulty breathing, headache or visual disturbances,  Any other questions or concerns you may have after discharge.  In an emergency, call 911 or go to an Emergency Department at a nearby hospital.

## 2016-08-09 NOTE — Anesthesia Procedure Notes (Signed)
Procedure Name: LMA Insertion Date/Time: 08/09/2016 7:25 AM Performed by: Neldon Newport Pre-anesthesia Checklist: Timeout performed, Patient being monitored, Suction available, Emergency Drugs available and Patient identified Patient Re-evaluated:Patient Re-evaluated prior to inductionOxygen Delivery Method: Circle system utilized Preoxygenation: Pre-oxygenation with 100% oxygen Intubation Type: IV induction Ventilation: Mask ventilation without difficulty LMA: LMA inserted LMA Size: 3.0 Number of attempts: 1 Placement Confirmation: breath sounds checked- equal and bilateral,  positive ETCO2 and ETT inserted through vocal cords under direct vision Tube secured with: Tape Dental Injury: Teeth and Oropharynx as per pre-operative assessment

## 2016-08-10 ENCOUNTER — Encounter (HOSPITAL_COMMUNITY): Payer: Self-pay | Admitting: Surgery

## 2016-08-23 ENCOUNTER — Encounter: Payer: Self-pay | Admitting: Hematology and Oncology

## 2016-08-23 ENCOUNTER — Ambulatory Visit (HOSPITAL_BASED_OUTPATIENT_CLINIC_OR_DEPARTMENT_OTHER): Payer: PPO | Admitting: Hematology and Oncology

## 2016-08-23 DIAGNOSIS — Z170 Estrogen receptor positive status [ER+]: Secondary | ICD-10-CM

## 2016-08-23 DIAGNOSIS — C50411 Malignant neoplasm of upper-outer quadrant of right female breast: Secondary | ICD-10-CM | POA: Diagnosis not present

## 2016-08-23 NOTE — Assessment & Plan Note (Signed)
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 On the initial biopsy was DCIS.  Recommendation: 1. Lymph node evaluation with sentinel lymph node biopsy 2. adjuvant chemotherapy with Taxol Herceptin 3. Adjuvant radiation therapy 4. Followed by adjuvant antiestrogen therapy

## 2016-08-23 NOTE — Progress Notes (Signed)
Patient Care Team: Crist Infante, MD as PCP - General (Internal Medicine) [name], MD as Consulting Physician (General Surgery) Magrinat, [name], MD as Consulting Physician (Oncology) Kyung Rudd, MD as Consulting Physician (Radiation Oncology)  DIAGNOSIS:  Encounter Diagnosis  Name Primary?  . Malignant neoplasm of upper-outer quadrant of right breast in female, estrogen receptor positive ([place])     SUMMARY OF ONCOLOGIC HISTORY:   Malignant neoplasm of upper-outer quadrant of right breast in female, estrogen receptor positive ([place])   1991 Initial Biopsy    Left Breast cancer treated with breast conserving surgery followed by chemotherapy and radiation, unknown receptor status and unknown chemotherapy regimen      07/18/2016 Initial Diagnosis    Right breast calcifications 4 mm size, biopsy revealed DCIS with calcifications low intermediate grade ER 100%, PR 50%, Tis Nx stage 0      08/09/2016 Surgery    Right lumpectomy: 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 She underwent lumpectomy and is here today to discuss the pathology report. The lumpectomy showed invasive ductal carcinoma that is ER/PR and HER-2 positive. Dr. Lucia Gaskins recommended lymph node evaluation with sentinel lymph node biopsy. Patient is very anxious and does not feel like she wants to do any such procedures. Her husband is a retired Psychologist, sport and exercise. She is here today to discuss the treatment options.  REVIEW OF SYSTEMS:   Constitutional: Denies fevers, chills or abnormal weight loss Eyes: Denies blurriness of vision Ears, nose, mouth, throat, and face: Denies mucositis or sore  throat Respiratory: Denies cough, dyspnea or wheezes Cardiovascular: Denies palpitation, chest discomfort Gastrointestinal:  Denies nausea, heartburn or change in bowel habits Skin: Denies abnormal skin rashes Lymphatics: Denies new lymphadenopathy or easy bruising Neurological:Denies numbness, tingling or new weaknesses Behavioral/Psych: Mood is stable, no new changes  Extremities: No lower extremity edema Breast: Recovered very well from the recent lumpectomy All other systems were reviewed with the patient and are negative.  I have reviewed the past medical history, past surgical history, social history and family history with the patient and they are unchanged from previous note.  ALLERGIES:  is allergic to erythromycin and macrobid [nitrofurantoin].  MEDICATIONS:  Current Outpatient Prescriptions  Medication Sig Dispense Refill  . albuterol (PROVENTIL HFA;VENTOLIN HFA) 108 (90 Base) MCG/ACT inhaler Inhale 2 puffs into the lungs every 6 (six) hours as needed. (Patient taking differently: Inhale 2 puffs into the lungs every 6 (six) hours as needed for wheezing or shortness of breath. ) 1 Inhaler 3  . budesonide-formoterol (SYMBICORT) 160-4.5 MCG/ACT inhaler INHALE 2 PUFFS INTO THE LUNGS TWICE DAILY 10.2 g 5  . Cholecalciferol (VITAMIN D) 2000 units CAPS Take 2,000 Units by mouth daily.    . clopidogrel (PLAVIX) 75 MG tablet Take 75 mg by mouth daily.      . Cyanocobalamin (B-12 PO) Take 1 tablet by mouth daily.    Marland Kitchen HYDROcodone-acetaminophen (NORCO/VICODIN) 5-325 MG tablet Take 1-2 tablets by mouth every 6 (six) hours as needed for moderate pain. 20 tablet 0  . metoprolol succinate (TOPROL-XL) 25 MG 24 hr tablet Take 12.5 mg by mouth daily.    . pravastatin (PRAVACHOL) 20 MG  tablet Take 20 mg by mouth 2 (two) times a week.     . ranitidine (ZANTAC) 150 MG tablet Take 150 mg by mouth daily.    Marland Kitchen tiotropium (SPIRIVA HANDIHALER) 18 MCG inhalation capsule Place 1 capsule (18 mcg total)  into inhaler and inhale daily. 30 capsule 5   No current facility-administered medications for this visit.     PHYSICAL EXAMINATION: ECOG PERFORMANCE STATUS: 1 - Symptomatic but completely ambulatory  Vitals:   08/23/16 1057  BP: (!) 166/76  Pulse: 76  Resp: 18  Temp: 98 F (36.7 C)   Filed Weights   08/23/16 1057  Weight: 130 lb (59 kg)    GENERAL:alert, no distress and comfortable SKIN: skin color, texture, turgor are normal, no rashes or significant lesions EYES: normal, Conjunctiva are pink and non-injected, sclera clear OROPHARYNX:no exudate, no erythema and lips, buccal mucosa, and tongue normal  NECK: supple, thyroid normal size, non-tender, without nodularity LYMPH:  no palpable lymphadenopathy in the cervical, axillary or inguinal LUNGS: clear to auscultation and percussion with normal breathing effort HEART: regular rate & rhythm and no murmurs and no lower extremity edema ABDOMEN:abdomen soft, non-tender and normal bowel sounds MUSCULOSKELETAL:no cyanosis of digits and no clubbing  NEURO: alert & oriented x 3 with fluent speech, no focal motor/sensory deficits EXTREMITIES: No lower extremity edema  LABORATORY DATA:  I have reviewed the data as listed   Chemistry      Component Value Date/Time   NA 136 08/09/2016 0602   NA 141 07/25/2016 0837   K 3.8 08/09/2016 0602   K 4.3 07/25/2016 0837   CL 105 08/09/2016 0602   CO2 23 08/09/2016 0602   CO2 24 07/25/2016 0837   BUN 14 08/09/2016 0602   BUN 17.8 07/25/2016 0837   CREATININE 0.79 08/09/2016 0602   CREATININE 0.9 07/25/2016 0837      Component Value Date/Time   CALCIUM 9.0 08/09/2016 0602   CALCIUM 10.2 07/25/2016 0837   ALKPHOS 60 07/25/2016 0837   AST 18 07/25/2016 0837   ALT 16 07/25/2016 0837   BILITOT 0.46 07/25/2016 0837       Lab Results  Component Value Date   WBC 6.0 08/09/2016   HGB 13.6 08/09/2016   HCT 42.6 08/09/2016   MCV 97.3 08/09/2016   PLT 243 08/09/2016   NEUTROABS 4.7  07/25/2016    ASSESSMENT & PLAN:  Malignant neoplasm of upper-outer quadrant of right breast in female, estrogen receptor positive ([place]) 08/09/2016 Right lumpectomy: 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 On the initial biopsy was DCIS.  Recommendation: 1. Lymph node evaluation with sentinel lymph node biopsy 2. adjuvant chemotherapy with Taxol Herceptin. After much discussion, patient does not want to receive Taxol because she does not want to lose her hair. She is willing to go through Herceptin therapy. I went over the risks and benefits of Herceptin including the risk of decline in cardiac ejection fraction. If she decides to proceed with the treatment she will need echocardiogram at baseline and every 3 months. Based on the recently completed a clinical trial PERSEPHONE, I recommended only 6 months of Herceptin therapy every 3 weeks. 3. Adjuvant radiation therapy 4. Followed by adjuvant antiestrogen therapy with anastrozole 1 mg  daily 5 years  I discussed with the patient that without the Taxol Herceptin may not have the same level of efficacy in decreasing the risk of recurrence. Given her reluctance to not have to undergo chemotherapy that causes hair loss, I did not recommend any other chemotherapies to substitute Taxol.  I spent 25 minutes talking to the patient of which more than half was spent in counseling and coordination of care.  No orders of the defined types were placed in this encounter.  The patient has a good understanding of the overall plan. she agrees with it. she will call with any problems that may develop before the next visit here.   Rulon Eisenmenger, MD 08/23/16

## 2016-08-24 NOTE — Progress Notes (Signed)
Location of Breast Cancer:Upper-outer quadrant of right breast in female  Histology per Pathology Report: 08-09-16  Diagnosis Breast, lumpectomy, Right - INVASIVE DUCTAL CARCINOMA, 1.3 CM. - DUCTAL CARCINOMA IN SITU WITH CALCIFICATIONS. - INVASIVE CARCINOMA FOCALLY LESS THAN 0.1 CM FROM SUPERIOR MARGIN. - PREVIOUS BIOPSY SITE AND CLIPS. - SEE ONCOLOGY TABLE.  Receptor Status: ER(100% +), PR (95% +), Her2-neu (neg), Ki-(3%) Diagnosis 07-18-16 Breast, right, needle core biopsy - DUCTAL CARCINOMA IN SITU WITH CALCIFICATIONS. - SEE MICROSCOPIC DESCRIPTION. Receptor Status: ER(100% +), PR (50% +), Her2-neu (), Ki-()  Did patient present with symptoms (if so, please note symptoms) or was this found on screening mammography?: routine screening   Past/Anticipated interventions by surgeon, if any:08-09-16 [name] Breast, lumpectomy, Right  Past/Anticipated interventions by medical oncology, if any: 07-18-16  Breast, right, needle core biopsy,6 months of Herceptin therapy every 3 weeks,Adjuvant radiation therapy,adjuvant antiestrogen therapy with anastrozole 1 mg daily 5 years   Lymphedema issues, if any:No  Pain issues, if any: No  SAFETY ISSUES:  Prior radiation? : Yes 1993 Left breast  Pacemaker/ICD? :No  Possible current pregnancy?: No  Is the patient on methotrexate? : No  Current Complaints / other details: 76 y.o. married woman with history of breast cancer  Menarche 12    G1 P1, BC yes, Menopause 50, HRT No Wt Readings from Last 3 Encounters:  08/30/16 129 lb (58.5 kg)  08/23/16 130 lb (59 kg)  08/09/16 129 lb (58.5 kg)  BP (!) 126/99   Pulse 72   Temp 98 F (36.7 C) (Oral)   Resp 18   Ht '5\' 5"'  (1.651 m)   Wt 129 lb (58.5 kg)   SpO2 97%   BMI 21.47 kg/m  Georgena Spurling, RN 08/24/2016,10:48 AM

## 2016-08-30 ENCOUNTER — Ambulatory Visit
Admission: RE | Admit: 2016-08-30 | Discharge: 2016-08-30 | Disposition: A | Discharge status: Home / Self Care | Payer: PPO | Source: Ambulatory Visit | Attending: Radiation Oncology | Admitting: Radiation Oncology

## 2016-08-30 ENCOUNTER — Encounter: Payer: Self-pay | Admitting: Radiation Oncology

## 2016-08-30 ENCOUNTER — Other Ambulatory Visit: Payer: Self-pay | Admitting: *Deleted

## 2016-08-30 VITALS — BP 126/99 | HR 72 | Temp 98.0°F | Resp 18 | Ht 65.0 in | Wt 129.0 lb

## 2016-08-30 DIAGNOSIS — Z923 Personal history of irradiation: Secondary | ICD-10-CM | POA: Diagnosis not present

## 2016-08-30 DIAGNOSIS — Z853 Personal history of malignant neoplasm of breast: Secondary | ICD-10-CM | POA: Diagnosis not present

## 2016-08-30 DIAGNOSIS — Z170 Estrogen receptor positive status [ER+]: Principal | ICD-10-CM

## 2016-08-30 DIAGNOSIS — Z881 Allergy status to other antibiotic agents status: Secondary | ICD-10-CM | POA: Diagnosis not present

## 2016-08-30 DIAGNOSIS — C50411 Malignant neoplasm of upper-outer quadrant of right female breast: Secondary | ICD-10-CM | POA: Diagnosis not present

## 2016-08-30 DIAGNOSIS — J449 Chronic obstructive pulmonary disease, unspecified: Secondary | ICD-10-CM | POA: Diagnosis not present

## 2016-08-30 DIAGNOSIS — M1990 Unspecified osteoarthritis, unspecified site: Secondary | ICD-10-CM | POA: Diagnosis not present

## 2016-08-30 DIAGNOSIS — K219 Gastro-esophageal reflux disease without esophagitis: Secondary | ICD-10-CM | POA: Diagnosis not present

## 2016-08-30 DIAGNOSIS — Z7902 Long term (current) use of antithrombotics/antiplatelets: Secondary | ICD-10-CM | POA: Diagnosis not present

## 2016-08-30 DIAGNOSIS — D0511 Intraductal carcinoma in situ of right breast: Secondary | ICD-10-CM | POA: Diagnosis not present

## 2016-08-30 DIAGNOSIS — Z87891 Personal history of nicotine dependence: Secondary | ICD-10-CM | POA: Diagnosis not present

## 2016-08-30 DIAGNOSIS — Z9889 Other specified postprocedural states: Secondary | ICD-10-CM | POA: Diagnosis not present

## 2016-08-30 DIAGNOSIS — Z8673 Personal history of transient ischemic attack (TIA), and cerebral infarction without residual deficits: Secondary | ICD-10-CM | POA: Diagnosis not present

## 2016-08-30 DIAGNOSIS — Z79899 Other long term (current) drug therapy: Secondary | ICD-10-CM | POA: Diagnosis not present

## 2016-08-30 NOTE — Progress Notes (Signed)
Met with pt in rad onc. Discussed appts to be scheduled for echo and cardiology and tentative start date for herceptin on 09/10/16. Received verbal understanding.

## 2016-08-31 ENCOUNTER — Other Ambulatory Visit: Payer: Self-pay | Admitting: Hematology and Oncology

## 2016-08-31 DIAGNOSIS — C50411 Malignant neoplasm of upper-outer quadrant of right female breast: Secondary | ICD-10-CM

## 2016-08-31 DIAGNOSIS — Z170 Estrogen receptor positive status [ER+]: Principal | ICD-10-CM

## 2016-08-31 NOTE — Progress Notes (Signed)
START OFF PATHWAY REGIMEN - Breast   OFF00005:Trastuzumab (Maintenance - w/Loading Dose):   A cycle is every 21 days:     Trastuzumab      Trastuzumab   **Always confirm dose/schedule in your pharmacy ordering system**    Patient Characteristics: Postoperative without Neoadjuvant Therapy (Pathologic Staging), Invasive Disease, Adjuvant Therapy, pT0 - T1, Node Negative, HER2 Positive, ER Positive, T1c Therapeutic Status: Postoperative without Neoadjuvant Therapy (Pathologic Staging) AJCC Grade: G1 AJCC N Category: pNX AJCC M Category: cM0 ER Status: Positive (+) AJCC 8 Stage Grouping: Unknown HER2 Status: Positive (+) Oncotype Dx Recurrence Score: Not Appropriate AJCC T Category: pT1c PR Status: Positive (+) Intent of Therapy: Curative Intent, Discussed with Patient

## 2016-08-31 NOTE — Progress Notes (Signed)
Radiation Oncology         ([phone]) [phone] ________________________________  Name: Anna York MRN: [id number]  Date: 08/30/2016  DOB: [birth year]  BM:[id number], Elta Guadel[place], MD  Nicholas Lose, MD     REFERRING PHYSICIAN: Nicholas Lose, MD   DIAGNOSIS: The encounter diagnosis was Malignant neoplasm of upper-outer quadrant of right breast in female, estrogen receptor positive ([place]).   HISTORY OF PRESENT ILLNESS: Anna York is a 76 y.o. female seen in the multidisciplinary breast clinic for a new diagnosis of right breast cancer. The patient has a history of left breast cancer in 1993 that was treated with lumpectomy, radiotherapy and chemotherapy in sandwich fashion with Dr. Letitia Libra, Dr. Danny Lawless, and Dr. Leeanne Deed. She has been NED in the left breast since, and went for routine screening mammogram which revealed a cluster of calcifications which on ultrasound measured 4 mm on the right breast. No adenopathy was noted Stereotactic biopsy on 07/18/16 revealed grade 1-2, ER/PR positive DCIS with calcifications of the right breast.   She subsequently underwent a right lumpectomy on 08/09/16 which revealed a grade 1, invasive ductal carcinoma measuring 1.3 cm, and tumor was within 1 mm from the superior margin. DCIS was also noted in the specimen but DCIS was 5 mm distance from the closest margin. Her tumor was ER/PR positive, HER2 amplified, and Ki 67 was 3%. She has met with surgery and as her and she has decided against node sampling due to her negative ultrasound prior to her surgery. She has also met with medical oncology and decided against taxol/herceptin, but will receive herceptin as a single agent. She comes today to review options of adjuvant radiotherapy.    PREVIOUS RADIATION THERAPY: Yes   1993: the patient received radiotherapy to the left breast for about 6 weeks with Dr. Danny Lawless.   PAST MEDICAL HISTORY:  Past Medical History:  Diagnosis Date  . ASCUS (atypical squamous cells of  undetermined significance) on Pap smear    Neg high risk HPV  . Asthma   . Atrophic vaginitis   . Backache, unspecified   . Breast cancer ([place]) 1992   Chemo and Radiation  . Constipation   . COPD (chronic obstructive pulmonary disease) ([place])   . Cystitis, unspecified   . Diverticulosis   . Dysphagia   . GERD (gastroesophageal reflux disease)    indigestion  . Headache(784.0)   . Lymphocytic colitis   . Osteoarthritis   . Osteopenia 2012   T score -2.1  . Osteoporosis   . Palpitations   . Personal history of colonic polyps 07/07/2002   hyperplastic  . Personal history of malignant neoplasm of breast   . Pneumonia   . Stroke [facility]) 2010   2 TIA's   . TIA (transient ischemic attack)   . Unspecified transient cerebral ischemia        PAST SURGICAL HISTORY: Past Surgical History:  Procedure Laterality Date  . BREAST BIOPSY    . BREAST LUMPECTOMY     chemo and radiation  . BREAST LUMPECTOMY WITH RADIOACTIVE SEED LOCALIZATION Right 08/09/2016   Procedure: RIGHT BREAST LUMPECTOMY WITH RADIOACTIVE SEED LOCALIZATION;  Surgeon: [name], MD;  Location: [place];  Service: General;  Laterality: Right;  . CARDIAC CATHETERIZATION    . CATARACT EXTRACTION    . COLONOSCOPY  2011   diverticulosis  . COLPOSCOPY    . FACIAL COSMETIC SURGERY    . LYMPHADENECTOMY  1993  . TONSILLECTOMY       FAMILY HISTORY:  Family  History  Problem Relation Age of Onset  . Breast cancer Mother        Age 57  . Hypertension Father      SOCIAL HISTORY:  reports that she quit smoking about 16 years ago. Her smoking use included Cigarettes. She has never used smokeless tobacco. She reports that she drinks about 8.4 oz of alcohol per week . She reports that she does not use drugs. The patient is married and her husband is a retired Editor, commissioning. 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   ALLERGIES: Erythromycin and Macrobid [nitrofurantoin]   MEDICATIONS:  Current  Outpatient Prescriptions  Medication Sig Dispense Refill  . budesonide-formoterol (SYMBICORT) 160-4.5 MCG/ACT inhaler INHALE 2 PUFFS INTO THE LUNGS TWICE DAILY 10.2 g 5  . Cholecalciferol (VITAMIN D) 2000 units CAPS Take 2,000 Units by mouth daily.    . clopidogrel (PLAVIX) 75 MG tablet Take 75 mg by mouth daily.      . Cyanocobalamin (B-12 PO) Take 1 tablet by mouth daily.    . metoprolol succinate (TOPROL-XL) 25 MG 24 hr tablet Take 12.5 mg by mouth daily.    . pravastatin (PRAVACHOL) 20 MG tablet Take 20 mg by mouth 2 (two) times a week.     . ranitidine (ZANTAC) 150 MG tablet Take 150 mg by mouth daily.    Marland Kitchen tiotropium (SPIRIVA HANDIHALER) 18 MCG inhalation capsule Place 1 capsule (18 mcg total) into inhaler and inhale daily. 30 capsule 5  . albuterol (PROVENTIL HFA;VENTOLIN HFA) 108 (90 Base) MCG/ACT inhaler Inhale 2 puffs into the lungs every 6 (six) hours as needed. (Patient not taking: Reported on 08/30/2016) 1 Inhaler 3   No current facility-administered medications for this encounter.      REVIEW OF SYSTEMS: On review of systems, the patient reports that she is doing well overall. She feels like she's healed well since surgery. She denies any limitiations in her ROM or pain in the breast/swelling. She denies any chest pain, shortness of breath, cough, fevers, chills, night sweats, unintended weight changes. She denies any bowel or bladder disturbances, and denies abdominal pain, nausea or vomiting. She denies any new musculoskeletal or joint aches or pains. A complete review of systems is obtained and is otherwise negative.     PHYSICAL EXAM:  Wt Readings from Last 3 Encounters:  08/30/16 129 lb (58.5 kg)  08/23/16 130 lb (59 kg)  08/09/16 129 lb (58.5 kg)   Temp Readings from Last 3 Encounters:  08/30/16 98 F (36.7 C) (Oral)  08/23/16 98 F (36.7 C) (Oral)  08/09/16 97 F (36.1 C)   BP Readings from Last 3 Encounters:  08/30/16 (!) 126/99  08/23/16 (!) 166/76  08/09/16  (!) 168/69   Pulse Readings from Last 3 Encounters:  08/30/16 72  08/23/16 76  08/09/16 68   Pain Assessment Pain Score: 0-No pain/10  In general this is a well appearing caucasian female in no acute distress. She's alert and oriented x4 and appropriate throughout the examination. Cardiopulmonary assessment is negative for acute distress and she exhibits normal effort. The right breast incision is assessed and intact. No erythema, separation or drainage is noted.     ECOG = 0  0 - Asymptomatic (Fully active, able to carry on all predisease activities without restriction)  1 - Symptomatic but completely ambulatory (Restricted in physically strenuous activity but ambulatory and able to carry out work of a light or sedentary nature. For example, light housework, office  work)  2 - Symptomatic, <50% in bed during the day (Ambulatory and capable of all self care but unable to carry out any work activities. Up and about more than 50% of waking hours)  3 - Symptomatic, >50% in bed, but not bedbound (Capable of only limited self-care, confined to bed or chair 50% or more of waking hours)  4 - Bedbound (Completely disabled. Cannot carry on any self-care. Totally confined to bed or chair)  5 - Death   Eustace Pen MM, Creech RH, Tormey DC, et al. [phone]). "Toxicity and response criteria of the [facility] Group". [place] Oncol. 5 (6): 649-55    LABORATORY DATA:  Lab Results  Component Value Date   WBC 6.0 08/09/2016   HGB 13.6 08/09/2016   HCT 42.6 08/09/2016   MCV 97.3 08/09/2016   PLT 243 08/09/2016   Lab Results  Component Value Date   NA 136 08/09/2016   K 3.8 08/09/2016   CL 105 08/09/2016   CO2 23 08/09/2016   Lab Results  Component Value Date   ALT 16 07/25/2016   AST 18 07/25/2016   ALKPHOS 60 07/25/2016   BILITOT 0.46 07/25/2016      RADIOGRAPHY: No results found.     IMPRESSION/PLAN: 1. Grade 1 Triple positive invasive ductal carcinoma and DCIS  with calcifications of the right breast. Dr. Lisbeth Renshaw discusses the pathology findings reviews the rationale for adjuvant radiation. She is healed well and can proceed with herceptin which is due to begin on 09/10/16. She is planning a trip to [place] and would like to wait until she returns the last week in August to begin external radiotherapy. Dr. Lisbeth Renshaw is in agreement and we discussed the risks, benefits, short, and long term effects of radiotherapy, and the patient is interested in proceeding. Dr. Lisbeth Renshaw discusses the delivery and logistics of radiotherapy, and Dr. Lisbeth Renshaw recommends a course of 4 weeks of treatment. We will see her back on 09/22/16 to proceed with simulation. Written consent is obtained and placed in the chart, a copy was provided to the patient. 2. Remote history of left breast cancer. The patient continues to be NED by screening, and will continue screening of the left breast annually. 3. Possible genetic predisposition to malignancy. The patient has declined referral to genetics but may reconsider this in the future.   In a visit lasting 25 minutes, greater than 50% of the time was spent face to face discussing her pathology, and coordinating the patient's care.   The above documentation reflects my direct findings during this shared patient visit. Please see the separate note by Dr. Lisbeth Renshaw on this date for the remainder of the patient's plan of care.    Carola Rhine, PAC

## 2016-09-01 ENCOUNTER — Telehealth: Payer: Self-pay | Admitting: Hematology and Oncology

## 2016-09-01 NOTE — Telephone Encounter (Signed)
lvm to inform pt of chemo class 7/17 at 1200 per sch msg and 7/24 chemo start

## 2016-09-04 ENCOUNTER — Other Ambulatory Visit (HOSPITAL_COMMUNITY): Payer: PPO

## 2016-09-04 ENCOUNTER — Ambulatory Visit (HOSPITAL_COMMUNITY)
Admission: RE | Admit: 2016-09-04 | Discharge: 2016-09-04 | Disposition: A | Discharge status: Home / Self Care | Payer: PPO | Source: Ambulatory Visit | Attending: Hematology and Oncology | Admitting: Hematology and Oncology

## 2016-09-04 ENCOUNTER — Other Ambulatory Visit: Payer: PPO

## 2016-09-04 ENCOUNTER — Encounter (INDEPENDENT_AMBULATORY_CARE_PROVIDER_SITE_OTHER): Payer: Self-pay

## 2016-09-04 DIAGNOSIS — Z01810 Encounter for preprocedural cardiovascular examination: Secondary | ICD-10-CM | POA: Diagnosis not present

## 2016-09-04 DIAGNOSIS — Z170 Estrogen receptor positive status [ER+]: Secondary | ICD-10-CM | POA: Insufficient documentation

## 2016-09-04 DIAGNOSIS — C50411 Malignant neoplasm of upper-outer quadrant of right female breast: Secondary | ICD-10-CM | POA: Diagnosis not present

## 2016-09-04 NOTE — Progress Notes (Signed)
  Echocardiogram 2D Echocardiogram has been performed.  Anna York 09/04/2016, 11:20 AM

## 2016-09-05 ENCOUNTER — Encounter: Payer: Self-pay | Admitting: *Deleted

## 2016-09-05 ENCOUNTER — Ambulatory Visit (HOSPITAL_COMMUNITY)
Admission: RE | Admit: 2016-09-05 | Discharge: 2016-09-05 | Disposition: A | Discharge status: Home / Self Care | Payer: PPO | Source: Ambulatory Visit | Attending: Cardiology | Admitting: Cardiology

## 2016-09-05 ENCOUNTER — Encounter (HOSPITAL_COMMUNITY): Payer: Self-pay | Admitting: Cardiology

## 2016-09-05 VITALS — BP 129/71 | HR 87 | Wt 128.5 lb

## 2016-09-05 DIAGNOSIS — Z7902 Long term (current) use of antithrombotics/antiplatelets: Secondary | ICD-10-CM | POA: Diagnosis not present

## 2016-09-05 DIAGNOSIS — Z170 Estrogen receptor positive status [ER+]: Secondary | ICD-10-CM | POA: Insufficient documentation

## 2016-09-05 DIAGNOSIS — Z803 Family history of malignant neoplasm of breast: Secondary | ICD-10-CM | POA: Diagnosis not present

## 2016-09-05 DIAGNOSIS — J449 Chronic obstructive pulmonary disease, unspecified: Secondary | ICD-10-CM | POA: Diagnosis not present

## 2016-09-05 DIAGNOSIS — C50411 Malignant neoplasm of upper-outer quadrant of right female breast: Secondary | ICD-10-CM

## 2016-09-05 DIAGNOSIS — Z87891 Personal history of nicotine dependence: Secondary | ICD-10-CM | POA: Diagnosis not present

## 2016-09-05 DIAGNOSIS — Z853 Personal history of malignant neoplasm of breast: Secondary | ICD-10-CM | POA: Insufficient documentation

## 2016-09-05 DIAGNOSIS — Z8673 Personal history of transient ischemic attack (TIA), and cerebral infarction without residual deficits: Secondary | ICD-10-CM | POA: Diagnosis not present

## 2016-09-05 NOTE — Patient Instructions (Signed)
We will contact you in 3 months to schedule your next appointment.  

## 2016-09-06 NOTE — Progress Notes (Signed)
Oncology: Dr. Lindi Adie  76 yo with history of COPD, prior TIAs, and breast cancer presents for cardio-oncology evaluation, she was referred by Dr. Lindi Adie.  She had left breast cancer diagnosed in 1991, had lumpectomy/radiation/chemo.  She had recurrence of right breast cancer in 5/18. Biopsy ER+/PR+/HER2+.  Plan for lumpectomy + radiation and 6 months of Herceptin q 3 wks.   At baseline, she is short of breath walking up a hill or walking fast.  No dyspnea with a steady pace.  This is chronic for her and likely related to COPD. No chest pain.  No orthopnea/PND.    PMH: 1. COPD 2. TIAs: About 8 years ago.  Echo in 2010 showed a positive bubble study suggestive of PFO.  3. Breast cancer: Left breast cancer diagnosed in 1991, had lumpectomy/radiation/chemo.  She had recurrence of right breast cancer in 5/18. Biopsy ER+/PR+/HER2+.  Plan for lumpectomy + radiation and 6 months of Herceptin q 3 wks.  - Echo (7/18): EF 55-60%, mild AI, mild MR, normal RV size and systolic function.   Social History   Social History  . Marital status: Married    Spouse name: N/A  . Number of children: 1  . Years of education: N/A   Occupational History  . realtor     [name]   Social History Main Topics  . Smoking status: Former Smoker    Types: Cigarettes    Quit date: 02/20/2000  . Smokeless tobacco: Never Used  . Alcohol use 8.4 oz/week    14 Glasses of wine per week     Comment: daily  . Drug use: No  . Sexual activity: Yes    Birth control/ protection: Post-menopausal     Comment: 1st intercourse 76 yo-Fewer than 5 partners   Other Topics Concern  . Not on file   Social History Narrative   Married to Dr Ethelle Lyon   Family History  Problem Relation Age of Onset  . Breast cancer Mother        Age 32  . Hypertension Father    ROS: All systems reviewed and negative except as per HPI.   Current Outpatient Prescriptions  Medication Sig Dispense Refill  . albuterol  (PROVENTIL HFA;VENTOLIN HFA) 108 (90 Base) MCG/ACT inhaler Inhale 2 puffs into the lungs every 6 (six) hours as needed. 1 Inhaler 3  . budesonide-formoterol (SYMBICORT) 160-4.5 MCG/ACT inhaler INHALE 2 PUFFS INTO THE LUNGS TWICE DAILY 10.2 g 5  . Cholecalciferol (VITAMIN D) 2000 units CAPS Take 2,000 Units by mouth daily.    . clopidogrel (PLAVIX) 75 MG tablet Take 75 mg by mouth daily.      . Cyanocobalamin (B-12 PO) Take 1 tablet by mouth daily.    . metoprolol succinate (TOPROL-XL) 25 MG 24 hr tablet Take 12.5 mg by mouth daily.    . pravastatin (PRAVACHOL) 20 MG tablet Take 20 mg by mouth 2 (two) times a week.     . ranitidine (ZANTAC) 150 MG tablet Take 150 mg by mouth daily.    Marland Kitchen tiotropium (SPIRIVA HANDIHALER) 18 MCG inhalation capsule Place 1 capsule (18 mcg total) into inhaler and inhale daily. 30 capsule 5   No current facility-administered medications for this encounter.    BP 129/71   Pulse 87   Wt 128 lb 8 oz (58.3 kg)   SpO2 98%   BMI 21.38 kg/m  General: NAD Neck: No JVD, no thyromegaly or thyroid nodule.  Lungs: Clear to auscultation  bilaterally with normal respiratory effort. CV: Nondisplaced PMI.  Heart regular S1/S2, no S3/S4, no murmur.  No peripheral edema.  No carotid bruit.  Normal pedal pulses.  Abdomen: Soft, nontender, no hepatosplenomegaly, no distention.  Skin: Intact without lesions or rashes.  Neurologic: Alert and oriented x 3.  Psych: Normal affect. Extremities: No clubbing or cyanosis.  HEENT: Normal.   Assessment/Plan: 1. TIAs: TIAs in 2010.  Echo at that time showed positive bubble study, likely a PFO.  She was started on ASA and Plavix and has not had a TIA or CVA since that time.  - Continue ASA 81 and Plavix.  2. Breast cancer: Patient will be getting 6 months of Herceptin therapy, starting next week.  We discussed the cardiac risk of Herceptin.  I reviewed today's echo, which showed normal EF.  - I will repeat an echo in 3 months and she will  return to clinic at that time.  3. COPD: No longer smokes.  Has baseline dyspnea.   Loralie Champagne 09/06/2016

## 2016-09-11 ENCOUNTER — Encounter: Payer: Self-pay | Admitting: *Deleted

## 2016-09-11 ENCOUNTER — Ambulatory Visit (HOSPITAL_BASED_OUTPATIENT_CLINIC_OR_DEPARTMENT_OTHER): Payer: PPO

## 2016-09-11 ENCOUNTER — Encounter: Payer: Self-pay | Admitting: Hematology and Oncology

## 2016-09-11 ENCOUNTER — Ambulatory Visit (HOSPITAL_BASED_OUTPATIENT_CLINIC_OR_DEPARTMENT_OTHER): Payer: PPO | Admitting: Hematology and Oncology

## 2016-09-11 ENCOUNTER — Other Ambulatory Visit (HOSPITAL_BASED_OUTPATIENT_CLINIC_OR_DEPARTMENT_OTHER): Payer: PPO

## 2016-09-11 DIAGNOSIS — Z5112 Encounter for antineoplastic immunotherapy: Secondary | ICD-10-CM | POA: Diagnosis not present

## 2016-09-11 DIAGNOSIS — C50411 Malignant neoplasm of upper-outer quadrant of right female breast: Secondary | ICD-10-CM

## 2016-09-11 DIAGNOSIS — Z170 Estrogen receptor positive status [ER+]: Secondary | ICD-10-CM

## 2016-09-11 LAB — COMPREHENSIVE METABOLIC PANEL
ALT: 14 U/L (ref 0–55)
ANION GAP: 11 meq/L (ref 3–11)
AST: 15 U/L (ref 5–34)
Albumin: 4.2 g/dL (ref 3.5–5.0)
Alkaline Phosphatase: 59 U/L (ref 40–150)
BUN: 14.9 mg/dL (ref 7.0–26.0)
CHLORIDE: 107 meq/L (ref 98–109)
CO2: 22 meq/L (ref 22–29)
Calcium: 9.6 mg/dL (ref 8.4–10.4)
Creatinine: 0.8 mg/dL (ref 0.6–1.1)
EGFR: 68 mL/min/{1.73_m2} — AB (ref >90)
GLUCOSE: 85 mg/dL (ref 70–140)
Potassium: 4.3 mEq/L (ref 3.5–5.1)
SODIUM: 140 meq/L (ref 136–145)
Total Bilirubin: 0.38 mg/dL (ref 0.20–1.20)
Total Protein: 6.8 g/dL (ref 6.4–8.3)

## 2016-09-11 LAB — CBC WITH DIFFERENTIAL/PLATELET
BASO%: 1.3 % (ref 0.0–2.0)
Basophils Absolute: 0.1 10*3/uL (ref 0.0–0.1)
EOS ABS: 0.2 10*3/uL (ref 0.0–0.5)
EOS%: 3.2 % (ref 0.0–7.0)
HCT: 43.5 % (ref 34.8–46.6)
HGB: 14.5 g/dL (ref 11.6–15.9)
LYMPH%: 20.8 % (ref 14.0–49.7)
MCH: 31.9 pg (ref 25.1–34.0)
MCHC: 33.4 g/dL (ref 31.5–36.0)
MCV: 95.4 fL (ref 79.5–101.0)
MONO#: 0.8 10*3/uL (ref 0.1–0.9)
MONO%: 14.6 % — AB (ref 0.0–14.0)
NEUT#: 3.4 10*3/uL (ref 1.5–6.5)
NEUT%: 60.1 % (ref 38.4–76.8)
PLATELETS: 236 10*3/uL (ref 145–400)
RBC: 4.57 10*6/uL (ref 3.70–5.45)
RDW: 13.7 % (ref 11.2–14.5)
WBC: 5.6 10*3/uL (ref 3.9–10.3)
lymph#: 1.2 10*3/uL (ref 0.9–3.3)

## 2016-09-11 MED ORDER — SODIUM CHLORIDE 0.9 % IV SOLN
Freq: Once | INTRAVENOUS | Status: AC
  Administered 2016-09-11: 12:00:00 via INTRAVENOUS

## 2016-09-11 MED ORDER — DIPHENHYDRAMINE HCL 25 MG PO CAPS
50.0000 mg | ORAL_CAPSULE | Freq: Once | ORAL | Status: AC
  Administered 2016-09-11: 50 mg via ORAL

## 2016-09-11 MED ORDER — ACETAMINOPHEN 325 MG PO TABS
650.0000 mg | ORAL_TABLET | Freq: Once | ORAL | Status: AC
  Administered 2016-09-11: 650 mg via ORAL

## 2016-09-11 MED ORDER — SODIUM CHLORIDE 0.9 % IV SOLN
8.0000 mg/kg | Freq: Once | INTRAVENOUS | Status: AC
  Administered 2016-09-11: 462 mg via INTRAVENOUS
  Filled 2016-09-11: qty 22

## 2016-09-11 MED ORDER — ACETAMINOPHEN 325 MG PO TABS
ORAL_TABLET | ORAL | Status: AC
  Filled 2016-09-11: qty 2

## 2016-09-11 MED ORDER — DIPHENHYDRAMINE HCL 25 MG PO CAPS
ORAL_CAPSULE | ORAL | Status: AC
  Filled 2016-09-11: qty 2

## 2016-09-11 NOTE — Assessment & Plan Note (Addendum)
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 6 months of Herceptin alone. 2. adjuvant radiation 3. antiestrogen therapy with anastrozole ---------------------------------------------------------------------------- Current treatment: Cycle 1 Herceptin  Patient will be set up to see radiation oncology for adjuvant radiation. Once radiation is complete and we will start antiestrogen therapy with anastrozole.  Return to clinic in 3 weeks for cycle 2.  

## 2016-09-11 NOTE — Patient Instructions (Signed)
[facility] Discharge Instructions for Patients   Today you received the following: Herceptin   To help prevent nausea and vomiting after your treatment, we encourage you to take your nausea medication as directed.    If you develop nausea and vomiting that is not controlled by your nausea medication, call the clinic.   BELOW ARE SYMPTOMS THAT SHOULD BE REPORTED IMMEDIATELY:  *FEVER GREATER THAN 100.5 F  *CHILLS WITH OR WITHOUT FEVER  NAUSEA AND VOMITING THAT IS NOT CONTROLLED WITH YOUR NAUSEA MEDICATION  *UNUSUAL SHORTNESS OF BREATH  *UNUSUAL BRUISING OR BLEEDING  TENDERNESS IN MOUTH AND THROAT WITH OR WITHOUT PRESENCE OF ULCERS  *URINARY PROBLEMS  *BOWEL PROBLEMS  UNUSUAL RASH Items with * indicate a potential emergency and should be followed up as soon as possible.  Feel free to call the clinic you have any questions or concerns. The clinic phone number is [phone].  Please show the CHEMO ALERT CARD at check-in to the Emergency Department and triage nurse.   Trastuzumab injection for infusion What is this medicine? TRASTUZUMAB (tras TOO zoo mab) is a monoclonal antibody. It is used to treat breast cancer and stomach cancer. This medicine may be used for other purposes; ask your health care provider or pharmacist if you have questions. COMMON BRAND NAME(S): Herceptin What should I tell my health care provider before I take this medicine? They need to know if you have any of these conditions: -heart disease -heart failure -lung or breathing disease, like asthma -an unusual or allergic reaction to trastuzumab, benzyl alcohol, or other medications, foods, dyes, or preservatives -pregnant or trying to get pregnant -breast-feeding How should I use this medicine? This drug is given as an infusion into a vein. It is administered in a hospital or clinic by a specially trained health care professional. Talk to your pediatrician regarding the use of this  medicine in children. This medicine is not approved for use in children. Overdosage: If you think you have taken too much of this medicine contact a poison control center or emergency room at once. NOTE: This medicine is only for you. Do not share this medicine with others. What if I miss a dose? It is important not to miss a dose. Call your doctor or health care professional if you are unable to keep an appointment. What may interact with this medicine? This medicine may interact with the following medications: -certain types of chemotherapy, such as daunorubicin, doxorubicin, epirubicin, and idarubicin This list may not describe all possible interactions. Give your health care provider a list of all the medicines, herbs, non-prescription drugs, or dietary supplements you use. Also tell them if you smoke, drink alcohol, or use illegal drugs. Some items may interact with your medicine. What should I watch for while using this medicine? Visit your doctor for checks on your progress. Report any side effects. Continue your course of treatment even though you feel ill unless your doctor tells you to stop. Call your doctor or health care professional for advice if you get a fever, chills or sore throat, or other symptoms of a cold or flu. Do not treat yourself. Try to avoid being around people who are sick. You may experience fever, chills and shaking during your first infusion. These effects are usually mild and can be treated with other medicines. Report any side effects during the infusion to your health care professional. Fever and chills usually do not happen with later infusions. Do not become pregnant while taking   this medicine or for 7 months after stopping it. Women should inform their doctor if they wish to become pregnant or think they might be pregnant. Women of child-bearing potential will need to have a negative pregnancy test before starting this medicine. There is a potential for serious side  effects to an unborn child. Talk to your health care professional or pharmacist for more information. Do not breast-feed an infant while taking this medicine or for 7 months after stopping it. Women must use effective birth control with this medicine. What side effects may I notice from receiving this medicine? Side effects that you should report to your doctor or health care professional as soon as possible: -allergic reactions like skin rash, itching or hives, swelling of the face, lips, or tongue -chest pain or palpitations -cough -dizziness -feeling faint or lightheaded, falls -fever -general ill feeling or flu-like symptoms -signs of worsening heart failure like breathing problems; swelling in your legs and feet -unusually weak or tired Side effects that usually do not require medical attention (report to your doctor or health care professional if they continue or are bothersome): -bone pain -changes in taste -diarrhea -joint pain -nausea/vomiting -weight loss This list may not describe all possible side effects. Call your doctor for medical advice about side effects. You may report side effects to FDA at 1-800-FDA-1088. Where should I keep my medicine? This drug is given in a hospital or clinic and will not be stored at home. NOTE: This sheet is a summary. It may not cover all possible information. If you have questions about this medicine, talk to your doctor, pharmacist, or health care provider.  2018 Elsevier/Gold Standard (2016-01-31 14:37:52)  

## 2016-09-11 NOTE — Progress Notes (Signed)
Pt tolerated infusion well. Discharge instructions reviewed and printed. Pt and patient's spouse verbalizes understanding. Pt stable at discharge.

## 2016-09-11 NOTE — Progress Notes (Signed)
Patient Care Team: Crist Infante, MD as PCP - General (Internal Medicine) [name], MD as Consulting Physician (General Surgery) Magrinat, [name], MD as Consulting Physician (Oncology) Kyung Rudd, MD as Consulting Physician (Radiation Oncology)  DIAGNOSIS:  Encounter Diagnosis  Name Primary?  . Malignant neoplasm of upper-outer quadrant of right breast in female, estrogen receptor positive ([place])     SUMMARY OF ONCOLOGIC HISTORY:   Malignant neoplasm of upper-outer quadrant of right breast in female, estrogen receptor positive ([place])   1991 Initial Biopsy    Left Breast cancer treated with breast conserving surgery followed by chemotherapy and radiation, unknown receptor status and unknown chemotherapy regimen      07/18/2016 Initial Diagnosis    Right breast calcifications 4 mm size, biopsy revealed DCIS with calcifications low intermediate grade ER 100%, PR 50%, Tis Nx stage 0      08/09/2016 Surgery    Right lumpectomy: 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 Patient agreed to receive adjuvant Herceptin after much discussion back and forth. She is here today to receive her first treatment. Her echocardiogram showed an EF of 55-60%.  REVIEW OF SYSTEMS:   Constitutional: Denies fevers, chills or abnormal weight loss Eyes: Denies blurriness of vision Ears, nose, mouth, throat, and face: Denies mucositis or sore throat Respiratory: Denies cough, dyspnea or wheezes Cardiovascular: Denies palpitation, chest discomfort Gastrointestinal:  Denies nausea, heartburn or change in bowel habits Skin: Denies abnormal skin rashes Lymphatics:  Denies new lymphadenopathy or easy bruising Neurological:Denies numbness, tingling or new weaknesses Behavioral/Psych: Mood is stable, no new changes  Extremities: No lower extremity edema Breast:  denies any pain or lumps or nodules in either breasts All other systems were reviewed with the patient and are negative.  I have reviewed the past medical history, past surgical history, social history and family history with the patient and they are unchanged from previous note.  ALLERGIES:  is allergic to erythromycin and macrobid [nitrofurantoin].  MEDICATIONS:  Current Outpatient Prescriptions  Medication Sig Dispense Refill  . albuterol (PROVENTIL HFA;VENTOLIN HFA) 108 (90 Base) MCG/ACT inhaler Inhale 2 puffs into the lungs every 6 (six) hours as needed. 1 Inhaler 3  . budesonide-formoterol (SYMBICORT) 160-4.5 MCG/ACT inhaler INHALE 2 PUFFS INTO THE LUNGS TWICE DAILY 10.2 g 5  . Cholecalciferol (VITAMIN D) 2000 units CAPS Take 2,000 Units by mouth daily.    . clopidogrel (PLAVIX) 75 MG tablet Take 75 mg by mouth daily.      . Cyanocobalamin (B-12 PO) Take 1 tablet by mouth daily.    . metoprolol succinate (TOPROL-XL) 25 MG 24 hr tablet Take 12.5 mg by mouth daily.    . pravastatin (PRAVACHOL) 20 MG tablet Take 20 mg by mouth 2 (two) times a week.     . ranitidine (ZANTAC) 150 MG tablet Take 150 mg by mouth daily.    Marland Kitchen tiotropium (SPIRIVA HANDIHALER) 18 MCG inhalation capsule Place 1 capsule (18 mcg total) into inhaler and inhale daily. 30 capsule 5   No current facility-administered medications for this visit.    Facility-Administered Medications Ordered in Other Visits  Medication Dose Route  Frequency Provider Last Rate Last Dose  . 0.9 %  sodium chloride infusion   Intravenous Once Nicholas Lose, MD      . trastuzumab (HERCEPTIN) 462 mg in sodium chloride 0.9 % 250 mL chemo infusion  8 mg/kg (Treatment Plan Recorded) Intravenous Once Nicholas Lose, MD        PHYSICAL  EXAMINATION: ECOG PERFORMANCE STATUS: 1 - Symptomatic but completely ambulatory  Vitals:   09/11/16 1104  BP: (!) 171/72  Pulse: 75  Resp: 17  Temp: 97.8 F (36.6 C)   Filed Weights   09/11/16 1104  Weight: 129 lb 4.8 oz (58.7 kg)    GENERAL:alert, no distress and comfortable SKIN: skin color, texture, turgor are normal, no rashes or significant lesions EYES: normal, Conjunctiva are pink and non-injected, sclera clear OROPHARYNX:no exudate, no erythema and lips, buccal mucosa, and tongue normal  NECK: supple, thyroid normal size, non-tender, without nodularity LYMPH:  no palpable lymphadenopathy in the cervical, axillary or inguinal LUNGS: clear to auscultation and percussion with normal breathing effort HEART: regular rate & rhythm and no murmurs and no lower extremity edema ABDOMEN:abdomen soft, non-tender and normal bowel sounds MUSCULOSKELETAL:no cyanosis of digits and no clubbing  NEURO: alert & oriented x 3 with fluent speech, no focal motor/sensory deficits EXTREMITIES: No lower extremity edema BREAST: No palpable masses or nodules in either right or left breasts. No palpable axillary supraclavicular or infraclavicular adenopathy no breast tenderness or nipple discharge. (exam performed in the presence of a chaperone)  LABORATORY DATA:  I have reviewed the data as listed   Chemistry      Component Value Date/Time   NA 140 09/11/2016 1054   K 4.3 09/11/2016 1054   CL 105 08/09/2016 0602   CO2 22 09/11/2016 1054   BUN 14.9 09/11/2016 1054   CREATININE 0.8 09/11/2016 1054      Component Value Date/Time   CALCIUM 9.6 09/11/2016 1054   ALKPHOS 59 09/11/2016 1054   AST 15 09/11/2016 1054   ALT 14 09/11/2016 1054   BILITOT 0.38 09/11/2016 1054       Lab Results  Component Value Date   WBC 5.6 09/11/2016   HGB 14.5 09/11/2016   HCT 43.5 09/11/2016   MCV 95.4 09/11/2016   PLT 236 09/11/2016   NEUTROABS 3.4 09/11/2016    ASSESSMENT & PLAN:  Malignant  neoplasm of upper-outer quadrant of right breast in female, estrogen receptor positive ([place]) 08/09/2016 Right lumpectomy: 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 6 months of Herceptin alone. 2. adjuvant radiation 3. antiestrogen therapy with anastrozole ---------------------------------------------------------------------------- Current treatment: Cycle 1 Herceptin  Patient will be set up to see radiation oncology for adjuvant radiation. Once radiation is complete and we will start antiestrogen therapy with anastrozole.  Return to clinic in once every 3 cycles. Patient does not need labs with each treatment.  I spent 25 minutes talking to the patient of which more than half was spent in counseling and coordination of care.  No orders of the defined types were placed in this encounter.  The patient has a good understanding of the overall plan. she agrees with it. she will call with any problems that may develop before the next visit here.   Rulon Eisenmenger, MD 09/11/16

## 2016-09-27 ENCOUNTER — Other Ambulatory Visit: Payer: Self-pay | Admitting: Emergency Medicine

## 2016-09-28 ENCOUNTER — Telehealth: Payer: Self-pay | Admitting: Genetics

## 2016-10-02 ENCOUNTER — Ambulatory Visit (HOSPITAL_BASED_OUTPATIENT_CLINIC_OR_DEPARTMENT_OTHER): Payer: PPO

## 2016-10-02 ENCOUNTER — Other Ambulatory Visit: Payer: PPO

## 2016-10-02 VITALS — BP 147/79 | HR 75 | Temp 97.9°F | Resp 18

## 2016-10-02 DIAGNOSIS — C50411 Malignant neoplasm of upper-outer quadrant of right female breast: Secondary | ICD-10-CM

## 2016-10-02 DIAGNOSIS — Z5112 Encounter for antineoplastic immunotherapy: Secondary | ICD-10-CM | POA: Diagnosis not present

## 2016-10-02 DIAGNOSIS — Z170 Estrogen receptor positive status [ER+]: Principal | ICD-10-CM

## 2016-10-02 MED ORDER — ACETAMINOPHEN 325 MG PO TABS
650.0000 mg | ORAL_TABLET | Freq: Once | ORAL | Status: AC
  Administered 2016-10-02: 650 mg via ORAL

## 2016-10-02 MED ORDER — DIPHENHYDRAMINE HCL 25 MG PO CAPS
50.0000 mg | ORAL_CAPSULE | Freq: Once | ORAL | Status: AC
Start: 2016-10-02 — End: 2016-10-02
  Administered 2016-10-02: 50 mg via ORAL

## 2016-10-02 MED ORDER — DIPHENHYDRAMINE HCL 25 MG PO CAPS
ORAL_CAPSULE | ORAL | Status: AC
Start: 2016-10-02 — End: 2016-10-02
  Filled 2016-10-02: qty 2

## 2016-10-02 MED ORDER — SODIUM CHLORIDE 0.9 % IV SOLN
Freq: Once | INTRAVENOUS | Status: AC
  Administered 2016-10-02: 16:00:00 via INTRAVENOUS

## 2016-10-02 MED ORDER — SODIUM CHLORIDE 0.9 % IV SOLN
6.0000 mg/kg | Freq: Once | INTRAVENOUS | Status: AC
  Administered 2016-10-02: 357 mg via INTRAVENOUS
  Filled 2016-10-02: qty 17

## 2016-10-02 MED ORDER — ACETAMINOPHEN 325 MG PO TABS
ORAL_TABLET | ORAL | Status: AC
  Filled 2016-10-02: qty 2

## 2016-10-02 NOTE — Patient Instructions (Signed)
Trastuzumab injection for infusion What is this medicine? TRASTUZUMAB (tras TOO zoo mab) is a monoclonal antibody. It is used to treat breast cancer and stomach cancer. This medicine may be used for other purposes; ask your health care provider or pharmacist if you have questions. COMMON BRAND NAME(S): Herceptin What should I tell my health care provider before I take this medicine? They need to know if you have any of these conditions: -heart disease -heart failure -lung or breathing disease, like asthma -an unusual or allergic reaction to trastuzumab, benzyl alcohol, or other medications, foods, dyes, or preservatives -pregnant or trying to get pregnant -breast-feeding How should I use this medicine? This drug is given as an infusion into a vein. It is administered in a hospital or clinic by a specially trained health care professional. Talk to your pediatrician regarding the use of this medicine in children. This medicine is not approved for use in children. Overdosage: If you think you have taken too much of this medicine contact a poison control center or emergency room at once. NOTE: This medicine is only for you. Do not share this medicine with others. What if I miss a dose? It is important not to miss a dose. Call your doctor or health care professional if you are unable to keep an appointment. What may interact with this medicine? This medicine may interact with the following medications: -certain types of chemotherapy, such as daunorubicin, doxorubicin, epirubicin, and idarubicin This list may not describe all possible interactions. Give your health care provider a list of all the medicines, herbs, non-prescription drugs, or dietary supplements you use. Also tell them if you smoke, drink alcohol, or use illegal drugs. Some items may interact with your medicine. What should I watch for while using this medicine? Visit your doctor for checks on your progress. Report any side effects.  Continue your course of treatment even though you feel ill unless your doctor tells you to stop. Call your doctor or health care professional for advice if you get a fever, chills or sore throat, or other symptoms of a cold or flu. Do not treat yourself. Try to avoid being around people who are sick. You may experience fever, chills and shaking during your first infusion. These effects are usually mild and can be treated with other medicines. Report any side effects during the infusion to your health care professional. Fever and chills usually do not happen with later infusions. Do not become pregnant while taking this medicine or for 7 months after stopping it. Women should inform their doctor if they wish to become pregnant or think they might be pregnant. Women of child-bearing potential will need to have a negative pregnancy test before starting this medicine. There is a potential for serious side effects to an unborn child. Talk to your health care professional or pharmacist for more information. Do not breast-feed an infant while taking this medicine or for 7 months after stopping it. Women must use effective birth control with this medicine. What side effects may I notice from receiving this medicine? Side effects that you should report to your doctor or health care professional as soon as possible: -allergic reactions like skin rash, itching or hives, swelling of the face, lips, or tongue -chest pain or palpitations -cough -dizziness -feeling faint or lightheaded, falls -fever -general ill feeling or flu-like symptoms -signs of worsening heart failure like breathing problems; swelling in your legs and feet -unusually weak or tired Side effects that usually do not require medical   attention (report to your doctor or health care professional if they continue or are bothersome): -bone pain -changes in taste -diarrhea -joint pain -nausea/vomiting -weight loss This list may not describe all  possible side effects. Call your doctor for medical advice about side effects. You may report side effects to FDA at 1-800-FDA-1088. Where should I keep my medicine? This drug is given in a hospital or clinic and will not be stored at home. NOTE: This sheet is a summary. It may not cover all possible information. If you have questions about this medicine, talk to your doctor, pharmacist, or health care provider.  2018 Elsevier/Gold Standard (2016-01-31 14:37:52)  

## 2016-10-04 ENCOUNTER — Other Ambulatory Visit: Payer: Self-pay | Admitting: Emergency Medicine

## 2016-10-23 ENCOUNTER — Ambulatory Visit: Payer: PPO | Admitting: Hematology and Oncology

## 2016-10-23 ENCOUNTER — Other Ambulatory Visit: Payer: PPO

## 2016-10-23 ENCOUNTER — Ambulatory Visit (HOSPITAL_BASED_OUTPATIENT_CLINIC_OR_DEPARTMENT_OTHER): Payer: PPO

## 2016-10-23 ENCOUNTER — Ambulatory Visit
Admission: RE | Admit: 2016-10-23 | Discharge: 2016-10-23 | Disposition: A | Discharge status: Home / Self Care | Payer: PPO | Source: Ambulatory Visit | Attending: Radiation Oncology | Admitting: Radiation Oncology

## 2016-10-23 ENCOUNTER — Encounter: Payer: PPO | Admitting: Genetics

## 2016-10-23 VITALS — BP 164/70 | HR 84 | Temp 97.6°F | Resp 19

## 2016-10-23 DIAGNOSIS — Z510 Encounter for antineoplastic radiation therapy: Secondary | ICD-10-CM | POA: Insufficient documentation

## 2016-10-23 DIAGNOSIS — Z881 Allergy status to other antibiotic agents status: Secondary | ICD-10-CM | POA: Diagnosis not present

## 2016-10-23 DIAGNOSIS — C50411 Malignant neoplasm of upper-outer quadrant of right female breast: Secondary | ICD-10-CM | POA: Diagnosis not present

## 2016-10-23 DIAGNOSIS — Z170 Estrogen receptor positive status [ER+]: Principal | ICD-10-CM

## 2016-10-23 DIAGNOSIS — Z7902 Long term (current) use of antithrombotics/antiplatelets: Secondary | ICD-10-CM | POA: Diagnosis not present

## 2016-10-23 DIAGNOSIS — Z5112 Encounter for antineoplastic immunotherapy: Secondary | ICD-10-CM

## 2016-10-23 DIAGNOSIS — Z79899 Other long term (current) drug therapy: Secondary | ICD-10-CM | POA: Insufficient documentation

## 2016-10-23 DIAGNOSIS — Z7951 Long term (current) use of inhaled steroids: Secondary | ICD-10-CM | POA: Insufficient documentation

## 2016-10-23 DIAGNOSIS — Z883 Allergy status to other anti-infective agents status: Secondary | ICD-10-CM | POA: Diagnosis not present

## 2016-10-23 MED ORDER — ACETAMINOPHEN 325 MG PO TABS
ORAL_TABLET | ORAL | Status: AC
  Filled 2016-10-23: qty 2

## 2016-10-23 MED ORDER — TRASTUZUMAB CHEMO 150 MG IV SOLR
6.0000 mg/kg | Freq: Once | INTRAVENOUS | Status: AC
  Administered 2016-10-23: 357 mg via INTRAVENOUS
  Filled 2016-10-23: qty 17

## 2016-10-23 MED ORDER — DIPHENHYDRAMINE HCL 25 MG PO CAPS
ORAL_CAPSULE | ORAL | Status: AC
  Filled 2016-10-23: qty 2

## 2016-10-23 MED ORDER — SODIUM CHLORIDE 0.9 % IV SOLN
Freq: Once | INTRAVENOUS | Status: AC
  Administered 2016-10-23: 16:00:00 via INTRAVENOUS

## 2016-10-23 MED ORDER — ACETAMINOPHEN 325 MG PO TABS
650.0000 mg | ORAL_TABLET | Freq: Once | ORAL | Status: AC
  Administered 2016-10-23: 650 mg via ORAL

## 2016-10-23 MED ORDER — DIPHENHYDRAMINE HCL 25 MG PO CAPS
50.0000 mg | ORAL_CAPSULE | Freq: Once | ORAL | Status: AC
  Administered 2016-10-23: 50 mg via ORAL

## 2016-10-23 NOTE — Patient Instructions (Signed)
[facility] Discharge Instructions for Patients   Today you received the following: Herceptin   To help prevent nausea and vomiting after your treatment, we encourage you to take your nausea medication as directed.    If you develop nausea and vomiting that is not controlled by your nausea medication, call the clinic.   BELOW ARE SYMPTOMS THAT SHOULD BE REPORTED IMMEDIATELY:  *FEVER GREATER THAN 100.5 F  *CHILLS WITH OR WITHOUT FEVER  NAUSEA AND VOMITING THAT IS NOT CONTROLLED WITH YOUR NAUSEA MEDICATION  *UNUSUAL SHORTNESS OF BREATH  *UNUSUAL BRUISING OR BLEEDING  TENDERNESS IN MOUTH AND THROAT WITH OR WITHOUT PRESENCE OF ULCERS  *URINARY PROBLEMS  *BOWEL PROBLEMS  UNUSUAL RASH Items with * indicate a potential emergency and should be followed up as soon as possible.  Feel free to call the clinic you have any questions or concerns. The clinic phone number is [phone].  Please show the [facility] at check-in to the Emergency Department and triage nurse.   Trastuzumab injection for infusion What is this medicine? TRASTUZUMAB (tras TOO zoo mab) is a monoclonal antibody. It is used to treat breast cancer and stomach cancer. This medicine may be used for other purposes; ask your health care provider or pharmacist if you have questions. COMMON BRAND NAME(S): Herceptin What should I tell my health care provider before I take this medicine? They need to know if you have any of these conditions: -heart disease -heart failure -lung or breathing disease, like asthma -an unusual or allergic reaction to trastuzumab, benzyl alcohol, or other medications, foods, dyes, or preservatives -pregnant or trying to get pregnant -breast-feeding How should I use this medicine? This drug is given as an infusion into a vein. It is administered in a hospital or clinic by a specially trained health care professional. Talk to your pediatrician regarding the use of this  medicine in children. This medicine is not approved for use in children. Overdosage: If you think you have taken too much of this medicine contact a poison control center or emergency room at once. NOTE: This medicine is only for you. Do not share this medicine with others. What if I miss a dose? It is important not to miss a dose. Call your doctor or health care professional if you are unable to keep an appointment. What may interact with this medicine? This medicine may interact with the following medications: -certain types of chemotherapy, such as daunorubicin, doxorubicin, epirubicin, and idarubicin This list may not describe all possible interactions. Give your health care provider a list of all the medicines, herbs, non-prescription drugs, or dietary supplements you use. Also tell them if you smoke, drink alcohol, or use illegal drugs. Some items may interact with your medicine. What should I watch for while using this medicine? Visit your doctor for checks on your progress. Report any side effects. Continue your course of treatment even though you feel ill unless your doctor tells you to stop. Call your doctor or health care professional for advice if you get a fever, chills or sore throat, or other symptoms of a cold or flu. Do not treat yourself. Try to avoid being around people who are sick. You may experience fever, chills and shaking during your first infusion. These effects are usually mild and can be treated with other medicines. Report any side effects during the infusion to your health care professional. Fever and chills usually do not happen with later infusions. Do not become pregnant while taking  this medicine or for 7 months after stopping it. Women should inform their doctor if they wish to become pregnant or think they might be pregnant. Women of child-bearing potential will need to have a negative pregnancy test before starting this medicine. There is a potential for serious side  effects to an unborn child. Talk to your health care professional or pharmacist for more information. Do not breast-feed an infant while taking this medicine or for 7 months after stopping it. Women must use effective birth control with this medicine. What side effects may I notice from receiving this medicine? Side effects that you should report to your doctor or health care professional as soon as possible: -allergic reactions like skin rash, itching or hives, swelling of the face, lips, or tongue -chest pain or palpitations -cough -dizziness -feeling faint or lightheaded, falls -fever -general ill feeling or flu-like symptoms -signs of worsening heart failure like breathing problems; swelling in your legs and feet -unusually weak or tired Side effects that usually do not require medical attention (report to your doctor or health care professional if they continue or are bothersome): -bone pain -changes in taste -diarrhea -joint pain -nausea/vomiting -weight loss This list may not describe all possible side effects. Call your doctor for medical advice about side effects. You may report side effects to FDA at 1-800-FDA-1088. Where should I keep my medicine? This drug is given in a hospital or clinic and will not be stored at home. NOTE: This sheet is a summary. It may not cover all possible information. If you have questions about this medicine, talk to your doctor, pharmacist, or health care provider.  2018 Elsevier/Gold Standard (2016-01-31 14:37:52)

## 2016-10-23 NOTE — Progress Notes (Signed)
  Radiation Oncology         [phone] ________________________________  Name: Anna York MRN: [id number]  Date: 10/23/2016  DOB: 01/12/1941  Optical Surface Tracking Plan:  Since intensity modulated radiotherapy (IMRT) and 3D conformal radiation treatment methods are predicated on accurate and precise positioning for treatment, intrafraction motion monitoring is medically necessary to ensure accurate and safe treatment delivery.  The ability to quantify intrafraction motion without excessive ionizing radiation dose can only be performed with optical surface tracking. Accordingly, surface imaging offers the opportunity to obtain 3D measurements of patient position throughout IMRT and 3D treatments without excessive radiation exposure.  I am ordering optical surface tracking for this patient's upcoming course of radiotherapy. ________________________________  Kyung Rudd, MD 10/23/2016 5:59 PM    Reference:   Particia Jasper, et al. Surface imaging-based analysis of intrafraction motion for breast radiotherapy patients.Journal of [place], n. 6, nov. 2014. ISSN [id number].   Available at: <http://www.jacmp.org/index.php/jacmp/article/view/4957>.

## 2016-10-23 NOTE — Progress Notes (Signed)
  Radiation Oncology         [phone]) [phone] ________________________________  Name: Anna York MRN: [id number]  Date: 10/23/2016  DOB: 1940/03/26  DIAGNOSIS:     ICD-10-CM   1. Malignant neoplasm of upper-outer quadrant of right breast in female, estrogen receptor positive ([place]) C50.411    Z17.0      SIMULATION AND TREATMENT PLANNING NOTE  The patient presented for simulation prior to beginning her course of radiation treatment for her diagnosis of right-sided breast cancer. The patient was placed in a supine position on a breast board. A customized vac-lock bag was constructed and this complex treatment device will be used on a daily basis during her treatment. In this fashion, a CT scan was obtained through the chest area and an isocenter was placed near the chest wall within the breast.  The patient will be planned to receive a course of radiation initially to a dose of 42.56 Gy. This will consist of a whole breast radiotherapy technique. To accomplish this, 2 customized blocks have been designed which will correspond to medial and lateral whole breast tangent fields. This treatment will be accomplished at 2.66 Gy per fraction. A forward planning technique will also be evaluated to determine if this approach improves the plan. It is anticipated that the patient will then receive a 10 Gy boost to the seroma cavity which has been contoured. This will be accomplished at 2 Gy per fraction.   This initial treatment will consist of a 3-D conformal technique. The seroma has been contoured as the primary target structure. Additionally, dose volume histograms of both this target as well as the lungs and heart will also be evaluated. Such an approach is necessary to ensure that the target area is adequately covered while the nearby critical  normal structures are adequately spared.  Plan:  The final anticipated total dose therefore will correspond to 52.56  Gy.    _______________________________   Jodelle Gross, MD, PhD

## 2016-10-24 ENCOUNTER — Ambulatory Visit (INDEPENDENT_AMBULATORY_CARE_PROVIDER_SITE_OTHER): Payer: PPO | Admitting: Family

## 2016-10-24 ENCOUNTER — Ambulatory Visit (INDEPENDENT_AMBULATORY_CARE_PROVIDER_SITE_OTHER): Payer: PPO

## 2016-10-24 DIAGNOSIS — S92502A Displaced unspecified fracture of left lesser toe(s), initial encounter for closed fracture: Secondary | ICD-10-CM

## 2016-10-24 DIAGNOSIS — S99921A Unspecified injury of right foot, initial encounter: Secondary | ICD-10-CM
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
## 2016-10-26 ENCOUNTER — Telehealth (INDEPENDENT_AMBULATORY_CARE_PROVIDER_SITE_OTHER): Payer: Self-pay | Admitting: Radiology

## 2016-10-26 DIAGNOSIS — Z510 Encounter for antineoplastic radiation therapy: Secondary | ICD-10-CM | POA: Diagnosis not present

## 2016-10-26 DIAGNOSIS — C50411 Malignant neoplasm of upper-outer quadrant of right female breast: Secondary | ICD-10-CM | POA: Diagnosis not present

## 2016-10-26 NOTE — Telephone Encounter (Signed)
Dr. Purcell Nails would like [name] to give him a call about his wife Anna York. He has some questions about her foot and care.  His call back # is [phone].

## 2016-10-30 ENCOUNTER — Ambulatory Visit
Admission: RE | Admit: 2016-10-30 | Discharge: 2016-10-30 | Disposition: A | Discharge status: Home / Self Care | Payer: PPO | Source: Ambulatory Visit | Attending: Radiation Oncology | Admitting: Radiation Oncology

## 2016-10-30 DIAGNOSIS — Z510 Encounter for antineoplastic radiation therapy: Secondary | ICD-10-CM | POA: Diagnosis not present

## 2016-10-30 DIAGNOSIS — C50411 Malignant neoplasm of upper-outer quadrant of right female breast: Secondary | ICD-10-CM | POA: Diagnosis not present

## 2016-10-31 ENCOUNTER — Ambulatory Visit
Admission: RE | Admit: 2016-10-31 | Discharge: 2016-10-31 | Disposition: A | Discharge status: Home / Self Care | Payer: PPO | Source: Ambulatory Visit | Attending: Radiation Oncology | Admitting: Radiation Oncology

## 2016-10-31 ENCOUNTER — Telehealth (INDEPENDENT_AMBULATORY_CARE_PROVIDER_SITE_OTHER): Payer: Self-pay | Admitting: Radiology

## 2016-10-31 DIAGNOSIS — Z510 Encounter for antineoplastic radiation therapy: Secondary | ICD-10-CM | POA: Diagnosis not present

## 2016-10-31 DIAGNOSIS — C50411 Malignant neoplasm of upper-outer quadrant of right female breast: Secondary | ICD-10-CM | POA: Diagnosis not present

## 2016-10-31 NOTE — Telephone Encounter (Signed)
IC Dr Purcell Nails and advised.

## 2016-10-31 NOTE — Telephone Encounter (Signed)
Dr Purcell Nails, patient's husband, says he would like a call to discuss wife's diagnosis and treatment.  He called already and there is a message in Erin's box.  He just wants to make sure this fracture will heal as is, and see what is needed for it, if anything.  You can call or I can call, let me know. He asked me to send to you directly to advise.

## 2016-11-01 ENCOUNTER — Ambulatory Visit
Admission: RE | Admit: 2016-11-01 | Discharge: 2016-11-01 | Disposition: A | Discharge status: Home / Self Care | Payer: PPO | Source: Ambulatory Visit | Attending: Radiation Oncology | Admitting: Radiation Oncology

## 2016-11-01 DIAGNOSIS — C50411 Malignant neoplasm of upper-outer quadrant of right female breast: Secondary | ICD-10-CM | POA: Diagnosis not present

## 2016-11-01 DIAGNOSIS — Z170 Estrogen receptor positive status [ER+]: Principal | ICD-10-CM

## 2016-11-01 DIAGNOSIS — Z510 Encounter for antineoplastic radiation therapy: Secondary | ICD-10-CM | POA: Diagnosis not present

## 2016-11-01 MED ORDER — RADIAPLEXRX EX GEL
Freq: Once | CUTANEOUS | Status: AC
  Administered 2016-11-01: 17:00:00 via TOPICAL

## 2016-11-01 MED ORDER — ALRA NON-METALLIC DEODORANT (RAD-ONC)
1.0000 "application " | Freq: Once | TOPICAL | Status: AC
  Administered 2016-11-01: 1 via TOPICAL

## 2016-11-01 NOTE — Progress Notes (Signed)
Pt education done, radiaplex, alra, , my business card, Radiation therapy and you book given, discussed fatigue,skin irritation, , pain, luke warm shower/bath,no rubbing scrubbing or scratching chest area, apply radiaplex cream to chest/daily after rad tx and bedtime, alra after radtx  and prn,.  increase protein in diet, , stay hydrated, drink plenty fluids; get plenty rest, and enough sleep, exercise , , dove unscented soap preferred, sees MD weekly and prn 3:27 PM

## 2016-11-02 ENCOUNTER — Ambulatory Visit
Admission: RE | Admit: 2016-11-02 | Discharge: 2016-11-02 | Disposition: A | Discharge status: Home / Self Care | Payer: PPO | Source: Ambulatory Visit | Attending: Radiation Oncology | Admitting: Radiation Oncology

## 2016-11-02 DIAGNOSIS — Z510 Encounter for antineoplastic radiation therapy: Secondary | ICD-10-CM | POA: Diagnosis not present

## 2016-11-02 DIAGNOSIS — C50411 Malignant neoplasm of upper-outer quadrant of right female breast: Secondary | ICD-10-CM | POA: Diagnosis not present

## 2016-11-05 ENCOUNTER — Ambulatory Visit
Admission: RE | Admit: 2016-11-05 | Discharge: 2016-11-05 | Disposition: A | Discharge status: Home / Self Care | Payer: PPO | Source: Ambulatory Visit | Attending: Radiation Oncology | Admitting: Radiation Oncology

## 2016-11-05 DIAGNOSIS — Z510 Encounter for antineoplastic radiation therapy: Secondary | ICD-10-CM | POA: Diagnosis not present

## 2016-11-05 DIAGNOSIS — C50411 Malignant neoplasm of upper-outer quadrant of right female breast: Secondary | ICD-10-CM | POA: Diagnosis not present

## 2016-11-06 ENCOUNTER — Ambulatory Visit
Admission: RE | Admit: 2016-11-06 | Discharge: 2016-11-06 | Disposition: A | Discharge status: Home / Self Care | Payer: PPO | Source: Ambulatory Visit | Attending: Radiation Oncology | Admitting: Radiation Oncology

## 2016-11-06 DIAGNOSIS — Z510 Encounter for antineoplastic radiation therapy: Secondary | ICD-10-CM | POA: Diagnosis not present

## 2016-11-06 DIAGNOSIS — C50411 Malignant neoplasm of upper-outer quadrant of right female breast: Secondary | ICD-10-CM | POA: Diagnosis not present

## 2016-11-07 ENCOUNTER — Ambulatory Visit
Admission: RE | Admit: 2016-11-07 | Discharge: 2016-11-07 | Disposition: A | Discharge status: Home / Self Care | Payer: PPO | Source: Ambulatory Visit | Attending: Radiation Oncology | Admitting: Radiation Oncology

## 2016-11-07 DIAGNOSIS — Z510 Encounter for antineoplastic radiation therapy: Secondary | ICD-10-CM | POA: Diagnosis not present

## 2016-11-07 DIAGNOSIS — C50411 Malignant neoplasm of upper-outer quadrant of right female breast: Secondary | ICD-10-CM | POA: Diagnosis not present

## 2016-11-08 ENCOUNTER — Ambulatory Visit
Admission: RE | Admit: 2016-11-08 | Discharge: 2016-11-08 | Disposition: A | Discharge status: Home / Self Care | Payer: PPO | Source: Ambulatory Visit | Attending: Radiation Oncology | Admitting: Radiation Oncology

## 2016-11-08 DIAGNOSIS — C50411 Malignant neoplasm of upper-outer quadrant of right female breast: Secondary | ICD-10-CM | POA: Diagnosis not present

## 2016-11-08 DIAGNOSIS — Z510 Encounter for antineoplastic radiation therapy: Secondary | ICD-10-CM | POA: Diagnosis not present

## 2016-11-09 ENCOUNTER — Ambulatory Visit: Payer: PPO

## 2016-11-12 ENCOUNTER — Ambulatory Visit (INDEPENDENT_AMBULATORY_CARE_PROVIDER_SITE_OTHER): Payer: PPO | Admitting: Emergency Medicine

## 2016-11-12 ENCOUNTER — Ambulatory Visit
Admission: RE | Admit: 2016-11-12 | Discharge: 2016-11-12 | Disposition: A | Discharge status: Home / Self Care | Payer: PPO | Source: Ambulatory Visit | Attending: Radiation Oncology | Admitting: Radiation Oncology

## 2016-11-12 ENCOUNTER — Ambulatory Visit (INDEPENDENT_AMBULATORY_CARE_PROVIDER_SITE_OTHER)
Admission: RE | Admit: 2016-11-12 | Discharge: 2016-11-12 | Disposition: A | Discharge status: Home / Self Care | Payer: PPO | Source: Ambulatory Visit | Attending: Emergency Medicine | Admitting: Emergency Medicine

## 2016-11-12 ENCOUNTER — Encounter: Payer: Self-pay | Admitting: Emergency Medicine

## 2016-11-12 DIAGNOSIS — R0609 Other forms of dyspnea: Secondary | ICD-10-CM | POA: Insufficient documentation

## 2016-11-12 DIAGNOSIS — J449 Chronic obstructive pulmonary disease, unspecified: Secondary | ICD-10-CM | POA: Diagnosis not present

## 2016-11-12 DIAGNOSIS — Z510 Encounter for antineoplastic radiation therapy: Secondary | ICD-10-CM | POA: Diagnosis not present

## 2016-11-12 DIAGNOSIS — R0602 Shortness of breath: Secondary | ICD-10-CM | POA: Diagnosis not present

## 2016-11-12 DIAGNOSIS — C50411 Malignant neoplasm of upper-outer quadrant of right female breast: Secondary | ICD-10-CM | POA: Diagnosis not present

## 2016-11-12 NOTE — Assessment & Plan Note (Signed)
Appears to be clinically stable without any new wheezing, chest tightness, cough. We will continue her current Spiriva, Symbicort as she is taking it

## 2016-11-12 NOTE — Addendum Note (Signed)
Addended by: Maryanna Shape A on: 11/12/2016 03:09 PM   Modules accepted: Orders

## 2016-11-12 NOTE — Progress Notes (Signed)
Subjective:    Patient ID: Anna York, female    DOB: [birth year], 76 y.o.   MRN: [id number] HPI 76 yo woman, former smoker (20 pk-yrs), hx breast CA, cerebrovascular disease, ? ASD (hasn't had TEE, TTE on 09/04/11 did not see it, PASP estimated 30-15mHg), HTN. She has been told that she had asthma in the past - seemed to be related to pet hair exposure, paint fumes. On these (rare) occasions, she responded well to albuterol. More recently she has been under eval by Dr Joylene Draft for exertional SOB. She recalls that her exertional tolerance has always been less than her peers, but now she has noticed that she has trouble with stairs, a brisk walk, especially if she is also talking. She reports occasional wheezing. Her CT scan chest 09/11/11 report is available - shows no PE, emphysematous changes. For this reason, a1-AT testing was done, not available yet.  She has stopped exercising about 2 yrs ago, has gained about 7 lbs.   ROV 10/18/11 -- f/u for chronic and episodic dyspnea, ? COPD with emphysematous change seen on imaging. Returns today after PFT >> moderately severe AFL, no BD response, hyperinflated RV, decreased DLCO that corrects for Va. She tried the albuterol and didn't notice very much. Her a-AT testing was normal.    PULMONARY FUNCTON TEST 10/18/2011  FVC 2.53  FEV1 1.03  FEV1/FVC 40.7  FVC  % Predicted 86  FEV % Predicted 49  FeF 25-75 .34  FeF 25-75 % Predicted 2.35    ROV 11/16/15 -- Is a follow-up visit for obstructive lung disease as indicated by pulmonary function testing and emphysematous change on chest CT. Also notes that she has a history of a palatine lesion on exam. She states that her breathing is stable, about the same as before. Has been managed on symbicort and spiriva. Uses albuterol occasionally.  No exacerbations since last time. She notes that the cost of Spiriva and Symbicort has been difficult  ROV 05/16/16 -- Anna York follows up today for her history of  COPD, emphysema. At our last visit in September we tried changing her bronchodilators to [facility]. She did not feel that the Stiolto was as beneficial and so she is now back on Symbicort plus Spiriva. She had a URI since our last visit, was treated with pred + abx in January by Dr Joylene Draft. In aftermath she had some persistent cough. Now improved. She is close to her baseline currently - she has had some need for her albuterol over thelast 2 weeks.   ROV 11/12/16 -- patient has a history of COPD, breast cancer. She is getting XRT thru mid October, is on herceptin. She feels dyspnea and fatigue after she takes the chemo. She remains on spiriva and symbicort. No chest tightness, wheeze. No cough, although she did have a URI a few weeks ago, now improved.       Objective:   Physical Exam Vitals:   11/12/16 1422  BP: 138/74  Pulse: 95  SpO2: 96%  Weight: 129 lb (58.5 kg)  Height: 5\' 5"  (1.651 m)   Gen: Pleasant, well-nourished, in no distress,  normal affect  ENT: No lesions,    Neck: No JVD, no TMG, no carotid bruits  Lungs: No use of accessory muscles, no wheeze   Cardiovascular: RRR, heart sounds normal, no murmur or gallops, no peripheral edema  Musculoskeletal: No deformities, no cyanosis or clubbing  Neuro: alert, non focal  Skin: Warm, no lesions or rashes  Assessment & Plan:  COPD (chronic obstructive pulmonary disease) Appears to be clinically stable without any new wheezing, chest tightness, cough. We will continue her current Spiriva, Symbicort as she is taking it  Dyspnea on exertion She relates this in time to her herceptin and radiation therapy. She is at some risk for opportunistic infection although no focal symptoms noted. No fever, no sputum. Also at risk for radiation pneumonitis or other form of pneumonitis. We will start with a chest x-ray, if any evidence for new infiltrates then we will perform a CT scan of her chest.  Baltazar Apo, MD, PhD 11/12/2016, 3:03  PM [facility] [phone] or if no answer [phone]

## 2016-11-12 NOTE — Assessment & Plan Note (Signed)
She relates this in time to her herceptin and radiation therapy. She is at some risk for opportunistic infection although no focal symptoms noted. No fever, no sputum. Also at risk for radiation pneumonitis or other form of pneumonitis. We will start with a chest x-ray, if any evidence for new infiltrates then we will perform a CT scan of her chest.

## 2016-11-12 NOTE — Patient Instructions (Signed)
We will perform a CXR today to insure no changes Please continue your Spiriva and Symbicort as you are taking them Follow with Dr Lamonte Sakai in 3 months or sooner if you have any problems.

## 2016-11-13 ENCOUNTER — Telehealth: Payer: Self-pay | Admitting: Emergency Medicine

## 2016-11-13 ENCOUNTER — Ambulatory Visit (HOSPITAL_BASED_OUTPATIENT_CLINIC_OR_DEPARTMENT_OTHER): Payer: PPO | Admitting: Hematology and Oncology

## 2016-11-13 ENCOUNTER — Other Ambulatory Visit (HOSPITAL_BASED_OUTPATIENT_CLINIC_OR_DEPARTMENT_OTHER): Payer: PPO

## 2016-11-13 ENCOUNTER — Encounter: Payer: Self-pay | Admitting: Hematology and Oncology

## 2016-11-13 ENCOUNTER — Other Ambulatory Visit: Payer: Self-pay

## 2016-11-13 ENCOUNTER — Ambulatory Visit (HOSPITAL_BASED_OUTPATIENT_CLINIC_OR_DEPARTMENT_OTHER): Payer: PPO

## 2016-11-13 ENCOUNTER — Ambulatory Visit
Admission: RE | Admit: 2016-11-13 | Discharge: 2016-11-13 | Disposition: A | Discharge status: Home / Self Care | Payer: PPO | Source: Ambulatory Visit | Attending: Radiation Oncology | Admitting: Radiation Oncology

## 2016-11-13 DIAGNOSIS — Z5112 Encounter for antineoplastic immunotherapy: Secondary | ICD-10-CM | POA: Diagnosis not present

## 2016-11-13 DIAGNOSIS — R0602 Shortness of breath: Secondary | ICD-10-CM

## 2016-11-13 DIAGNOSIS — Z510 Encounter for antineoplastic radiation therapy: Secondary | ICD-10-CM | POA: Diagnosis not present

## 2016-11-13 DIAGNOSIS — C50411 Malignant neoplasm of upper-outer quadrant of right female breast: Secondary | ICD-10-CM

## 2016-11-13 DIAGNOSIS — Z170 Estrogen receptor positive status [ER+]: Principal | ICD-10-CM

## 2016-11-13 LAB — COMPREHENSIVE METABOLIC PANEL
ALT: 14 U/L (ref 0–55)
ANION GAP: 8 meq/L (ref 3–11)
AST: 18 U/L (ref 5–34)
Albumin: 4.1 g/dL (ref 3.5–5.0)
Alkaline Phosphatase: 65 U/L (ref 40–150)
BILIRUBIN TOTAL: 0.48 mg/dL (ref 0.20–1.20)
BUN: 14.3 mg/dL (ref 7.0–26.0)
CALCIUM: 9.6 mg/dL (ref 8.4–10.4)
CO2: 23 meq/L (ref 22–29)
CREATININE: 0.8 mg/dL (ref 0.6–1.1)
Chloride: 108 mEq/L (ref 98–109)
EGFR: 72 mL/min/{1.73_m2} — ABNORMAL LOW (ref >90)
Glucose: 89 mg/dl (ref 70–140)
Potassium: 4.3 mEq/L (ref 3.5–5.1)
Sodium: 138 mEq/L (ref 136–145)
TOTAL PROTEIN: 6.7 g/dL (ref 6.4–8.3)

## 2016-11-13 LAB — CBC WITH DIFFERENTIAL/PLATELET
BASO%: 0.4 % (ref 0.0–2.0)
Basophils Absolute: 0 10*3/uL (ref 0.0–0.1)
EOS%: 1.8 % (ref 0.0–7.0)
Eosinophils Absolute: 0.1 10*3/uL (ref 0.0–0.5)
HEMATOCRIT: 40.5 % (ref 34.8–46.6)
HGB: 13.5 g/dL (ref 11.6–15.9)
LYMPH#: 0.7 10*3/uL — AB (ref 0.9–3.3)
LYMPH%: 14.5 % (ref 14.0–49.7)
MCH: 32.1 pg (ref 25.1–34.0)
MCHC: 33.3 g/dL (ref 31.5–36.0)
MCV: 96.4 fL (ref 79.5–101.0)
MONO#: 0.6 10*3/uL (ref 0.1–0.9)
MONO%: 13.7 % (ref 0.0–14.0)
NEUT%: 69.6 % (ref 38.4–76.8)
NEUTROS ABS: 3.2 10*3/uL (ref 1.5–6.5)
PLATELETS: 223 10*3/uL (ref 145–400)
RBC: 4.2 10*6/uL (ref 3.70–5.45)
RDW: 13.4 % (ref 11.2–14.5)
WBC: 4.5 10*3/uL (ref 3.9–10.3)

## 2016-11-13 MED ORDER — TRASTUZUMAB CHEMO 150 MG IV SOLR
6.0000 mg/kg | Freq: Once | INTRAVENOUS | Status: AC
  Administered 2016-11-13: 357 mg via INTRAVENOUS
  Filled 2016-11-13: qty 17

## 2016-11-13 MED ORDER — SODIUM CHLORIDE 0.9 % IV SOLN
Freq: Once | INTRAVENOUS | Status: AC
  Administered 2016-11-13: 14:00:00 via INTRAVENOUS

## 2016-11-13 MED ORDER — DIPHENHYDRAMINE HCL 25 MG PO CAPS
50.0000 mg | ORAL_CAPSULE | Freq: Once | ORAL | Status: AC
  Administered 2016-11-13: 25 mg via ORAL

## 2016-11-13 MED ORDER — DIPHENHYDRAMINE HCL 25 MG PO CAPS
ORAL_CAPSULE | ORAL | Status: AC
  Filled 2016-11-13: qty 1

## 2016-11-13 MED ORDER — ACETAMINOPHEN 325 MG PO TABS
650.0000 mg | ORAL_TABLET | Freq: Once | ORAL | Status: AC
  Administered 2016-11-13: 650 mg via ORAL

## 2016-11-13 MED ORDER — ACETAMINOPHEN 325 MG PO TABS
ORAL_TABLET | ORAL | Status: AC
  Filled 2016-11-13: qty 2

## 2016-11-13 NOTE — Patient Instructions (Signed)
[facility] Discharge Instructions for Patients Receiving Chemotherapy  Today you received the following chemotherapy agents Herceptin  To help prevent nausea and vomiting after your treatment, we encourage you to take your nausea medication as directed   If you develop nausea and vomiting that is not controlled by your nausea medication, call the clinic.   BELOW ARE SYMPTOMS THAT SHOULD BE REPORTED IMMEDIATELY:  *FEVER GREATER THAN 100.5 F  *CHILLS WITH OR WITHOUT FEVER  NAUSEA AND VOMITING THAT IS NOT CONTROLLED WITH YOUR NAUSEA MEDICATION  *UNUSUAL SHORTNESS OF BREATH  *UNUSUAL BRUISING OR BLEEDING  TENDERNESS IN MOUTH AND THROAT WITH OR WITHOUT PRESENCE OF ULCERS  *URINARY PROBLEMS  *BOWEL PROBLEMS  UNUSUAL RASH Items with * indicate a potential emergency and should be followed up as soon as possible.  Feel free to call the clinic should you have any questions or concerns. The clinic phone number is [phone].  Please show the CHEMO ALERT CARD at check-in to the Emergency Department and triage nurse.   

## 2016-11-13 NOTE — Assessment & Plan Note (Signed)
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 6 months of Herceptin alone started 09/11/16 2. adjuvant radiation started 10/31/16 3. antiestrogen therapy with anastrozole ---------------------------------------------------------------------------- Current treatment: Cycle 4 Herceptin  Once radiation is complete and we will start antiestrogen therapy with anastrozole.  Return to clinic in once every 3 cycles. Patient does not need labs with each treatment.

## 2016-11-13 NOTE — Progress Notes (Signed)
Patient Care Team: Crist Infante, MD as PCP - General (Internal Medicine) [name], MD as Consulting Physician (General Surgery) Magrinat, [name], MD as Consulting Physician (Oncology) Kyung Rudd, MD as Consulting Physician (Radiation Oncology)  DIAGNOSIS:  Encounter Diagnosis  Name Primary?  . Malignant neoplasm of upper-outer quadrant of right breast in female, estrogen receptor positive ([place])     SUMMARY OF ONCOLOGIC HISTORY:   Malignant neoplasm of upper-outer quadrant of right breast in female, estrogen receptor positive ([place])   1991 Initial Biopsy    Left Breast cancer treated with breast conserving surgery followed by chemotherapy and radiation, unknown receptor status and unknown chemotherapy regimen      07/18/2016 Initial Diagnosis    Right breast calcifications 4 mm size, biopsy revealed DCIS with calcifications low intermediate grade ER 100%, PR 50%, Tis Nx stage 0      08/09/2016 Surgery    Right lumpectomy: 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, with DCIS with calcifications, ER 100%, PR 95%, Ki-67 3%, HER-2 positive ratio 2.3, T1c Nx stage IA      09/11/2016 -  Chemotherapy    Herceptin every 3 weeks 6 months        CHIEF COMPLIANT: F/U on Herceptin Therapy  INTERVAL HISTORY: Anna York is a 76 year old with above-mentioned history of right breast cancer currently on adjuvant Herceptin. She complains of worsening shortness of breath and has seen pulmonary. They obtained a chest x-ray which did not reveal any evidence of pneumonia. She does have lung scarring from previous pneumonias. She does not have any fevers or chills. She wonders of Herceptin is causing her shortness of breath. She is continuing with radiation therapy and appears to be tolerating it fairly well.  REVIEW OF SYSTEMS:   Constitutional: Denies fevers, chills or abnormal weight loss Eyes: Denies blurriness of vision Ears, nose, mouth, throat, and face: Denies mucositis or sore  throat Respiratory: Shortness of breath to minimal exertion Cardiovascular: Denies palpitation, chest discomfort Gastrointestinal:  Denies nausea, heartburn or change in bowel habits Skin: Denies abnormal skin rashes Lymphatics: Denies new lymphadenopathy or easy bruising Neurological:Denies numbness, tingling or new weaknesses Behavioral/Psych: Mood is stable, no new changes  Extremities: No lower extremity edema Breast:  denies any pain or lumps or nodules in either breasts All other systems were reviewed with the patient and are negative.  I have reviewed the past medical history, past surgical history, social history and family history with the patient and they are unchanged from previous note.  ALLERGIES:  is allergic to erythromycin and macrobid [nitrofurantoin].  MEDICATIONS:  Current Outpatient Prescriptions  Medication Sig Dispense Refill  . albuterol (PROVENTIL HFA;VENTOLIN HFA) 108 (90 Base) MCG/ACT inhaler Inhale 2 puffs into the lungs every 6 (six) hours as needed. 1 Inhaler 3  . Cholecalciferol (VITAMIN D) 2000 units CAPS Take 2,000 Units by mouth daily.    . clopidogrel (PLAVIX) 75 MG tablet Take 75 mg by mouth daily.      . Cyanocobalamin (B-12 PO) Take 1 tablet by mouth daily.    . hyaluronate sodium (RADIAPLEXRX) GEL Apply 1 application topically 2 (two) times daily.    . metoprolol succinate (TOPROL-XL) 25 MG 24 hr tablet Take 12.5 mg by mouth daily.    . non-metallic deodorant Jethro Poling) MISC Apply 1 application topically daily.    . pravastatin (PRAVACHOL) 20 MG tablet Take 20 mg by mouth 2 (two) times a week.     . ranitidine (ZANTAC) 150 MG tablet Take 150 mg by  mouth daily.    Marland Kitchen SPIRIVA HANDIHALER 18 MCG inhalation capsule INHALE CONTENTS OF ONE CAPSULE ONCE A DAY 30 capsule 5  . SYMBICORT 160-4.5 MCG/ACT inhaler TWO PUFFS TWICE DAILY 10.2 g 2  . trastuzumab (HERCEPTIN) 150 MG SOLR injection Inject into the vein.     No current facility-administered medications for  this visit.     PHYSICAL EXAMINATION: ECOG PERFORMANCE STATUS: 1 - Symptomatic but completely ambulatory  There were no vitals filed for this visit. There were no vitals filed for this visit.  GENERAL:alert, no distress and comfortable SKIN: skin color, texture, turgor are normal, no rashes or significant lesions EYES: normal, Conjunctiva are pink and non-injected, sclera clear OROPHARYNX:no exudate, no erythema and lips, buccal mucosa, and tongue normal  NECK: supple, thyroid normal size, non-tender, without nodularity LYMPH:  no palpable lymphadenopathy in the cervical, axillary or inguinal LUNGS: clear to auscultation and percussion with normal breathing effort HEART: regular rate & rhythm and no murmurs and no lower extremity edema ABDOMEN:abdomen soft, non-tender and normal bowel sounds MUSCULOSKELETAL:no cyanosis of digits and no clubbing  NEURO: alert & oriented x 3 with fluent speech, no focal motor/sensory deficits EXTREMITIES: No lower extremity edema LABORATORY DATA:  I have reviewed the data as listed   Chemistry      Component Value Date/Time   NA 140 09/11/2016 1054   K 4.3 09/11/2016 1054   CL 105 08/09/2016 0602   CO2 22 09/11/2016 1054   BUN 14.9 09/11/2016 1054   CREATININE 0.8 09/11/2016 1054      Component Value Date/Time   CALCIUM 9.6 09/11/2016 1054   ALKPHOS 59 09/11/2016 1054   AST 15 09/11/2016 1054   ALT 14 09/11/2016 1054   BILITOT 0.38 09/11/2016 1054       Lab Results  Component Value Date   WBC 4.5 11/13/2016   HGB 13.5 11/13/2016   HCT 40.5 11/13/2016   MCV 96.4 11/13/2016   PLT 223 11/13/2016   NEUTROABS 3.2 11/13/2016    ASSESSMENT & PLAN:  Malignant neoplasm of upper-outer quadrant of right breast in female, estrogen receptor positive ([place]) 08/09/2016 Right lumpectomy:Grade 1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, with DCIS with calcifications, ER 100%, PR 95%, Ki-67 3%, HER-2 positive ratio 2.3, T1c Nx stage IA (1993 breast cancer  diagnosis treated with breast conserving surgery with radiation)  Treatment plan:  1. 6 months of Herceptin alone started 09/11/16 2. adjuvant radiation started 10/31/16 3. antiestrogen therapy with anastrozole ---------------------------------------------------------------------------- Current treatment: Cycle 4 Herceptin Herceptin toxicities: 1. Shortness of breath of unclear etiology. I discussed with her that Herceptin does not affect lung function without any cardiac issues. I instructed her to follow-up with pulmonary to determine if she has any obstructive or restrictive lung disease.  Her next echocardiogram will be done in October and we can evaluate that and determine if it Herceptin is affecting her cardiac function.   Once radiation is complete and we will start antiestrogen therapy with anastrozole.  Return to clinic in once every 3 cycles. I will see her in the last 4 Herceptin treatment at the end of November 2018.  Patient does not need labs with each treatment.  I spent 25 minutes talking to the patient of which more than half was spent in counseling and coordination of care.  No orders of the defined types were placed in this encounter.  The patient has a good understanding of the overall plan. she agrees with it. she will call with any problems that  may develop before the next visit here.   Rulon Eisenmenger, MD 11/13/16

## 2016-11-13 NOTE — Telephone Encounter (Signed)
Spoke with pt, she states she wants RB to compare xray he did yesterday to the last four she had at Dr. Abner Greenspan. She states scar tissue has been there since 1978. She states she doesn't have pneumonia and doesn't know why the radiologist wanted to do repeat x-ray, these markings are old. Please advise RB.

## 2016-11-14 ENCOUNTER — Ambulatory Visit
Admission: RE | Admit: 2016-11-14 | Discharge: 2016-11-14 | Disposition: A | Discharge status: Home / Self Care | Payer: PPO | Source: Ambulatory Visit | Attending: Radiation Oncology | Admitting: Radiation Oncology

## 2016-11-14 DIAGNOSIS — C50411 Malignant neoplasm of upper-outer quadrant of right female breast: Secondary | ICD-10-CM | POA: Diagnosis not present

## 2016-11-14 DIAGNOSIS — Z510 Encounter for antineoplastic radiation therapy: Secondary | ICD-10-CM | POA: Diagnosis not present

## 2016-11-15 ENCOUNTER — Telehealth: Payer: Self-pay | Admitting: Hematology and Oncology

## 2016-11-15 ENCOUNTER — Ambulatory Visit
Admission: RE | Admit: 2016-11-15 | Discharge: 2016-11-15 | Disposition: A | Discharge status: Home / Self Care | Payer: PPO | Source: Ambulatory Visit | Attending: Radiation Oncology | Admitting: Radiation Oncology

## 2016-11-15 DIAGNOSIS — Z510 Encounter for antineoplastic radiation therapy: Secondary | ICD-10-CM | POA: Diagnosis not present

## 2016-11-15 DIAGNOSIS — C50411 Malignant neoplasm of upper-outer quadrant of right female breast: Secondary | ICD-10-CM | POA: Diagnosis not present

## 2016-11-15 NOTE — Telephone Encounter (Signed)
Called spoke with patient and discussed RB's results/findings of her cxr films Pt happy and very grateful to RB for his assistance Pt denies any further questions/concerns at this time  Nothing further needed; will sign off

## 2016-11-15 NOTE — Telephone Encounter (Signed)
Added appt with Spiro on 11/27 because VG will not be in the office. Per 9/25 los

## 2016-11-15 NOTE — Telephone Encounter (Signed)
Let her know that I have looked at her films. I agree that the changes reflect chronic scar, no evidence for new PNA. We do not need to repeat her CXR anytime soon.

## 2016-11-16 ENCOUNTER — Ambulatory Visit
Admission: RE | Admit: 2016-11-16 | Discharge: 2016-11-16 | Disposition: A | Discharge status: Home / Self Care | Payer: PPO | Source: Ambulatory Visit | Attending: Radiation Oncology | Admitting: Radiation Oncology

## 2016-11-16 DIAGNOSIS — Z510 Encounter for antineoplastic radiation therapy: Secondary | ICD-10-CM | POA: Diagnosis not present

## 2016-11-16 DIAGNOSIS — C50411 Malignant neoplasm of upper-outer quadrant of right female breast: Secondary | ICD-10-CM | POA: Diagnosis not present

## 2016-11-19 ENCOUNTER — Ambulatory Visit
Admission: RE | Admit: 2016-11-19 | Discharge: 2016-11-19 | Disposition: A | Discharge status: Home / Self Care | Payer: PPO | Source: Ambulatory Visit | Attending: Radiation Oncology | Admitting: Radiation Oncology

## 2016-11-19 DIAGNOSIS — Z510 Encounter for antineoplastic radiation therapy: Secondary | ICD-10-CM | POA: Diagnosis not present

## 2016-11-19 DIAGNOSIS — C50411 Malignant neoplasm of upper-outer quadrant of right female breast: Secondary | ICD-10-CM | POA: Diagnosis not present

## 2016-11-20 ENCOUNTER — Ambulatory Visit
Admission: RE | Admit: 2016-11-20 | Discharge: 2016-11-20 | Disposition: A | Discharge status: Home / Self Care | Payer: PPO | Source: Ambulatory Visit | Attending: Radiation Oncology | Admitting: Radiation Oncology

## 2016-11-20 DIAGNOSIS — Z79899 Other long term (current) drug therapy: Secondary | ICD-10-CM | POA: Insufficient documentation

## 2016-11-20 DIAGNOSIS — C50411 Malignant neoplasm of upper-outer quadrant of right female breast: Secondary | ICD-10-CM | POA: Diagnosis present

## 2016-11-20 DIAGNOSIS — J449 Chronic obstructive pulmonary disease, unspecified: Secondary | ICD-10-CM | POA: Insufficient documentation

## 2016-11-20 DIAGNOSIS — Z853 Personal history of malignant neoplasm of breast: Secondary | ICD-10-CM | POA: Insufficient documentation

## 2016-11-20 DIAGNOSIS — M1990 Unspecified osteoarthritis, unspecified site: Secondary | ICD-10-CM | POA: Insufficient documentation

## 2016-11-20 DIAGNOSIS — D0511 Intraductal carcinoma in situ of right breast: Secondary | ICD-10-CM | POA: Diagnosis not present

## 2016-11-20 DIAGNOSIS — Z7902 Long term (current) use of antithrombotics/antiplatelets: Secondary | ICD-10-CM | POA: Diagnosis not present

## 2016-11-20 DIAGNOSIS — Z170 Estrogen receptor positive status [ER+]: Secondary | ICD-10-CM | POA: Diagnosis not present

## 2016-11-20 DIAGNOSIS — Z8673 Personal history of transient ischemic attack (TIA), and cerebral infarction without residual deficits: Secondary | ICD-10-CM | POA: Diagnosis not present

## 2016-11-20 DIAGNOSIS — Z881 Allergy status to other antibiotic agents status: Secondary | ICD-10-CM | POA: Insufficient documentation

## 2016-11-20 DIAGNOSIS — K219 Gastro-esophageal reflux disease without esophagitis: Secondary | ICD-10-CM | POA: Insufficient documentation

## 2016-11-20 DIAGNOSIS — Z87891 Personal history of nicotine dependence: Secondary | ICD-10-CM | POA: Insufficient documentation

## 2016-11-21 ENCOUNTER — Ambulatory Visit
Admission: RE | Admit: 2016-11-21 | Discharge: 2016-11-21 | Disposition: A | Discharge status: Home / Self Care | Payer: PPO | Source: Ambulatory Visit | Attending: Radiation Oncology | Admitting: Radiation Oncology

## 2016-11-21 DIAGNOSIS — Z510 Encounter for antineoplastic radiation therapy: Secondary | ICD-10-CM | POA: Diagnosis not present

## 2016-11-21 DIAGNOSIS — C50411 Malignant neoplasm of upper-outer quadrant of right female breast: Secondary | ICD-10-CM | POA: Diagnosis not present

## 2016-11-22 ENCOUNTER — Ambulatory Visit
Admission: RE | Admit: 2016-11-22 | Discharge: 2016-11-22 | Disposition: A | Discharge status: Home / Self Care | Payer: PPO | Source: Ambulatory Visit | Attending: Radiation Oncology | Admitting: Radiation Oncology

## 2016-11-22 ENCOUNTER — Ambulatory Visit: Payer: PPO

## 2016-11-22 DIAGNOSIS — C50411 Malignant neoplasm of upper-outer quadrant of right female breast: Secondary | ICD-10-CM | POA: Diagnosis not present

## 2016-11-22 DIAGNOSIS — Z510 Encounter for antineoplastic radiation therapy: Secondary | ICD-10-CM | POA: Diagnosis not present

## 2016-11-23 ENCOUNTER — Ambulatory Visit
Admission: RE | Admit: 2016-11-23 | Discharge: 2016-11-23 | Disposition: A | Discharge status: Home / Self Care | Payer: PPO | Source: Ambulatory Visit | Attending: Radiation Oncology | Admitting: Radiation Oncology

## 2016-11-23 ENCOUNTER — Ambulatory Visit: Payer: PPO

## 2016-11-23 DIAGNOSIS — Z510 Encounter for antineoplastic radiation therapy: Secondary | ICD-10-CM | POA: Diagnosis not present

## 2016-11-26 ENCOUNTER — Ambulatory Visit
Admission: RE | Admit: 2016-11-26 | Discharge: 2016-11-26 | Disposition: A | Discharge status: Home / Self Care | Payer: PPO | Source: Ambulatory Visit | Attending: Radiation Oncology | Admitting: Radiation Oncology

## 2016-11-26 DIAGNOSIS — Z510 Encounter for antineoplastic radiation therapy: Secondary | ICD-10-CM | POA: Diagnosis not present

## 2016-11-27 ENCOUNTER — Ambulatory Visit
Admission: RE | Admit: 2016-11-27 | Discharge: 2016-11-27 | Disposition: A | Discharge status: Home / Self Care | Payer: PPO | Source: Ambulatory Visit | Attending: Radiation Oncology | Admitting: Radiation Oncology

## 2016-11-27 ENCOUNTER — Ambulatory Visit: Payer: PPO

## 2016-11-27 DIAGNOSIS — Z510 Encounter for antineoplastic radiation therapy: Secondary | ICD-10-CM | POA: Diagnosis not present

## 2016-11-28 ENCOUNTER — Ambulatory Visit
Admission: RE | Admit: 2016-11-28 | Discharge: 2016-11-28 | Disposition: A | Discharge status: Home / Self Care | Payer: PPO | Source: Ambulatory Visit | Attending: Radiation Oncology | Admitting: Radiation Oncology

## 2016-11-28 DIAGNOSIS — C50411 Malignant neoplasm of upper-outer quadrant of right female breast: Secondary | ICD-10-CM | POA: Diagnosis not present

## 2016-11-29 DIAGNOSIS — Z510 Encounter for antineoplastic radiation therapy: Secondary | ICD-10-CM | POA: Diagnosis not present

## 2016-12-01 DIAGNOSIS — Z23 Encounter for immunization: Secondary | ICD-10-CM | POA: Diagnosis not present

## 2016-12-04 ENCOUNTER — Ambulatory Visit (HOSPITAL_BASED_OUTPATIENT_CLINIC_OR_DEPARTMENT_OTHER): Payer: PPO

## 2016-12-04 VITALS — BP 159/86 | HR 84 | Temp 98.9°F | Resp 18

## 2016-12-04 DIAGNOSIS — Z170 Estrogen receptor positive status [ER+]: Principal | ICD-10-CM

## 2016-12-04 DIAGNOSIS — Z5112 Encounter for antineoplastic immunotherapy: Secondary | ICD-10-CM | POA: Diagnosis not present

## 2016-12-04 DIAGNOSIS — C50411 Malignant neoplasm of upper-outer quadrant of right female breast: Secondary | ICD-10-CM | POA: Diagnosis not present

## 2016-12-04 MED ORDER — TRASTUZUMAB CHEMO 150 MG IV SOLR
6.0000 mg/kg | Freq: Once | INTRAVENOUS | Status: AC
  Administered 2016-12-04: 357 mg via INTRAVENOUS
  Filled 2016-12-04: qty 17

## 2016-12-04 MED ORDER — DIPHENHYDRAMINE HCL 25 MG PO CAPS
50.0000 mg | ORAL_CAPSULE | Freq: Once | ORAL | Status: AC
  Administered 2016-12-04: 25 mg via ORAL

## 2016-12-04 MED ORDER — ACETAMINOPHEN 325 MG PO TABS
ORAL_TABLET | ORAL | Status: AC
  Filled 2016-12-04: qty 2

## 2016-12-04 MED ORDER — SODIUM CHLORIDE 0.9 % IV SOLN
Freq: Once | INTRAVENOUS | Status: AC
  Administered 2016-12-04: 15:00:00 via INTRAVENOUS

## 2016-12-04 MED ORDER — ACETAMINOPHEN 325 MG PO TABS
650.0000 mg | ORAL_TABLET | Freq: Once | ORAL | Status: AC
  Administered 2016-12-04: 650 mg via ORAL

## 2016-12-04 MED ORDER — DIPHENHYDRAMINE HCL 25 MG PO CAPS
ORAL_CAPSULE | ORAL | Status: AC
  Filled 2016-12-04: qty 2

## 2016-12-04 NOTE — Patient Instructions (Signed)
Cancer Center Discharge Instructions for Patients Receiving Chemotherapy  Today you received the following chemotherapy agents Herceptin  To help prevent nausea and vomiting after your treatment, we encourage you to take your nausea medication as directed   If you develop nausea and vomiting that is not controlled by your nausea medication, call the clinic.   BELOW ARE SYMPTOMS THAT SHOULD BE REPORTED IMMEDIATELY:  *FEVER GREATER THAN 100.5 F  *CHILLS WITH OR WITHOUT FEVER  NAUSEA AND VOMITING THAT IS NOT CONTROLLED WITH YOUR NAUSEA MEDICATION  *UNUSUAL SHORTNESS OF BREATH  *UNUSUAL BRUISING OR BLEEDING  TENDERNESS IN MOUTH AND THROAT WITH OR WITHOUT PRESENCE OF ULCERS  *URINARY PROBLEMS  *BOWEL PROBLEMS  UNUSUAL RASH Items with * indicate a potential emergency and should be followed up as soon as possible.  Feel free to call the clinic should you have any questions or concerns. The clinic phone number is [phone].  Please show the CHEMO ALERT CARD at check-in to the Emergency Department and triage nurse.   

## 2016-12-06 ENCOUNTER — Encounter: Payer: Self-pay | Admitting: Radiation Oncology

## 2016-12-06 NOTE — Progress Notes (Signed)
  Radiation Oncology         [phone] ________________________________  Name: Anna York MRN: [id number]  Date: 12/06/2016  DOB: Jun 07, 1940  End of Treatment Note  Diagnosis:   Malignant neoplasm of upper-outer quadrant of right female breast     Indication for treatment: Curative     Radiation treatment dates:  10/31/2016 - 11/28/2016   Site/dose:   Right breast / 42.56 Gy in 16 fractions  Boost: 10 Gy in 4 fractions  Beams/energy:   Photon / 6X  Boost: Electron / 12E  Narrative: The patient tolerated radiation treatment relatively well.     Plan: The patient has completed radiation treatment. The patient will return to radiation oncology clinic for routine followup in one month. I advised them to call or return sooner if they have any questions or concerns related to their recovery or treatment.  ------------------------------------------------  Jodelle Gross, MD, PhD  This document serves as a record of services personally performed by Kyung Rudd, MD. It was created on his behalf by Valeta Harms, a trained medical scribe. The creation of this record is based on the scribe's personal observations and the provider's statements to them. This document has been checked and approved by the attending provider.

## 2016-12-12 ENCOUNTER — Encounter (HOSPITAL_COMMUNITY): Payer: Self-pay | Admitting: Cardiology

## 2016-12-12 ENCOUNTER — Ambulatory Visit (HOSPITAL_COMMUNITY)
Admission: RE | Admit: 2016-12-12 | Discharge: 2016-12-12 | Disposition: A | Discharge status: Home / Self Care | Payer: PPO | Source: Ambulatory Visit | Attending: Cardiology | Admitting: Cardiology

## 2016-12-12 ENCOUNTER — Ambulatory Visit (HOSPITAL_BASED_OUTPATIENT_CLINIC_OR_DEPARTMENT_OTHER)
Admission: RE | Admit: 2016-12-12 | Discharge: 2016-12-12 | Disposition: A | Discharge status: Home / Self Care | Payer: PPO | Source: Ambulatory Visit | Attending: Cardiology | Admitting: Cardiology

## 2016-12-12 VITALS — BP 140/78 | HR 75 | Wt 128.8 lb

## 2016-12-12 DIAGNOSIS — Z7902 Long term (current) use of antithrombotics/antiplatelets: Secondary | ICD-10-CM | POA: Diagnosis not present

## 2016-12-12 DIAGNOSIS — Z8673 Personal history of transient ischemic attack (TIA), and cerebral infarction without residual deficits: Secondary | ICD-10-CM | POA: Insufficient documentation

## 2016-12-12 DIAGNOSIS — C50411 Malignant neoplasm of upper-outer quadrant of right female breast: Secondary | ICD-10-CM | POA: Diagnosis not present

## 2016-12-12 DIAGNOSIS — Z79899 Other long term (current) drug therapy: Secondary | ICD-10-CM | POA: Insufficient documentation

## 2016-12-12 DIAGNOSIS — Z87891 Personal history of nicotine dependence: Secondary | ICD-10-CM | POA: Diagnosis not present

## 2016-12-12 DIAGNOSIS — J449 Chronic obstructive pulmonary disease, unspecified: Secondary | ICD-10-CM | POA: Insufficient documentation

## 2016-12-12 DIAGNOSIS — Z170 Estrogen receptor positive status [ER+]: Secondary | ICD-10-CM

## 2016-12-12 NOTE — Progress Notes (Signed)
  Echocardiogram 2D Echocardiogram has been performed.  Jennette Dubin 12/12/2016, 3:00 PM

## 2016-12-12 NOTE — Progress Notes (Signed)
Advanced Heart Failure Medication Review by a Pharmacist  Does the patient  feel that his/her medications are working for him/her?  yes  Has the patient been experiencing any side effects to the medications prescribed?  no  Does the patient measure his/her own blood pressure or blood glucose at home?  no   Does the patient have any problems obtaining medications due to transportation or finances?   no  Understanding of regimen: good Understanding of indications: good Potential of compliance: good Patient understands to avoid NSAIDs. Patient understands to avoid decongestants.  Issues to address at subsequent visits: None   Pharmacist comments: [name] is a pleasant 76 yo F presenting without a medication list but with good recall of her regimen. She reports good compliance with her regimen and did not have any specific medication-related questions or concerns for me at this time.   Ruta Hinds. Velva Harman, PharmD, BCPS, CPP Clinical Pharmacist Pager: [phone] Phone: [phone] 12/12/2016 2:59 PM      Time with patient: 10 minutes Preparation and documentation time: 2 minutes Total time: 12 minutes

## 2016-12-13 NOTE — Progress Notes (Signed)
Oncology: Dr. Lindi Adie  76 yo with history of COPD, prior TIAs, and breast cancer presents for cardio-oncology evaluation, she was referred by Dr. Lindi Adie.  She had left breast cancer diagnosed in 1991, had lumpectomy/radiation/chemo.  She had recurrence of right breast cancer in 5/18. Biopsy ER+/PR+/HER2+.  Plan for lumpectomy + radiation and 6 months of Herceptin q 3 wks.   She returns for followup of breast cancer/Herceptin use. She is at baseline symptomatically.  She is short of breath walking up a hill or walking fast.  No dyspnea with a steady pace.  This is chronic for her and likely related to COPD. No chest pain.  No orthopnea/PND.  She has 2 more Herceptin infusions to go.   PMH: 1. COPD 2. TIAs: About 8 years ago.  Echo in 2010 showed a positive bubble study suggestive of PFO.  3. Breast cancer: Left breast cancer diagnosed in 1991, had lumpectomy/radiation/chemo.  She had recurrence of right breast cancer in 5/18. Biopsy ER+/PR+/HER2+.  Plan for lumpectomy + radiation and 6 months of Herceptin q 3 wks.  - Echo (7/18): EF 55-60%, mild AI, mild MR, normal RV size and systolic function.  - Echo (10/18): EF 60-65%, GLS -22.1%.   Social History   Social History  . Marital status: Married    Spouse name: N/A  . Number of children: 1  . Years of education: N/A   Occupational History  . realtor     [name]  .  [name]   Social History Main Topics  . Smoking status: Former Smoker    Types: Cigarettes    Quit date: 02/20/2000  . Smokeless tobacco: Never Used  . Alcohol use 8.4 oz/week    14 Glasses of wine per week     Comment: daily  . Drug use: No  . Sexual activity: Yes    Birth control/ protection: Post-menopausal     Comment: 1st intercourse 76 yo-Fewer than 5 partners   Other Topics Concern  . Not on file   Social History Narrative   Married to Dr Ethelle Lyon   Family History  Problem Relation Age of Onset  . Breast cancer Mother        Age 16  .  Hypertension Father    ROS: All systems reviewed and negative except as per HPI.   Current Outpatient Prescriptions  Medication Sig Dispense Refill  . albuterol (PROVENTIL HFA;VENTOLIN HFA) 108 (90 Base) MCG/ACT inhaler Inhale 2 puffs into the lungs every 6 (six) hours as needed. 1 Inhaler 3  . Cholecalciferol (VITAMIN D) 2000 units CAPS Take 2,000 Units by mouth daily.    . clopidogrel (PLAVIX) 75 MG tablet Take 75 mg by mouth daily.      . Cyanocobalamin (B-12 PO) Take 1 tablet by mouth daily.    . metoprolol succinate (TOPROL-XL) 25 MG 24 hr tablet Take 12.5 mg by mouth daily.    . pravastatin (PRAVACHOL) 20 MG tablet Take 20 mg by mouth 2 (two) times a week.     . ranitidine (ZANTAC) 150 MG tablet Take 150 mg by mouth daily.    Marland Kitchen SPIRIVA HANDIHALER 18 MCG inhalation capsule INHALE CONTENTS OF ONE CAPSULE ONCE A DAY 30 capsule 5  . SYMBICORT 160-4.5 MCG/ACT inhaler TWO PUFFS TWICE DAILY 10.2 g 2  . trastuzumab (HERCEPTIN) 150 MG SOLR injection Inject into the vein.     No current facility-administered medications for this encounter.    BP 140/78   Pulse 75  Wt 128 lb 12.8 oz (58.4 kg)   SpO2 94%   BMI 21.43 kg/m  General: NAD Neck: No JVD, no thyromegaly or thyroid nodule.  Lungs: Clear to auscultation bilaterally with prolonged expiratory phase.  CV: Nondisplaced PMI.  Heart regular S1/S2, no S3/S4, no murmur.  No peripheral edema.  No carotid bruit.  Normal pedal pulses.  Abdomen: Soft, nontender, no hepatosplenomegaly, no distention.  Skin: Intact without lesions or rashes.  Neurologic: Alert and oriented x 3.  Psych: Normal affect. Extremities: No clubbing or cyanosis.  HEENT: Normal.   Assessment/Plan: 1. TIAs: TIAs in 2010.  Echo at that time showed positive bubble study, likely a PFO.  She was started on ASA and Plavix and has not had a TIA or CVA since that time.  - Continue ASA 81 and Plavix.  2. Breast cancer: Patient will be getting 6 months of Herceptin therapy,  has 2 more treatments.   I reviewed today's echo, which showed normal EF and strain pattern.  - I will repeat an echo in 3 months and she will return to clinic at that time.  This will be her last echo if it remains normal.  3. COPD: No longer smokes.  Has baseline dyspnea.   Loralie Champagne 12/13/2016

## 2016-12-25 ENCOUNTER — Ambulatory Visit (HOSPITAL_BASED_OUTPATIENT_CLINIC_OR_DEPARTMENT_OTHER): Payer: PPO

## 2016-12-25 VITALS — BP 137/71 | HR 90 | Temp 97.8°F | Resp 18

## 2016-12-25 DIAGNOSIS — Z170 Estrogen receptor positive status [ER+]: Principal | ICD-10-CM

## 2016-12-25 DIAGNOSIS — Z5112 Encounter for antineoplastic immunotherapy: Secondary | ICD-10-CM

## 2016-12-25 DIAGNOSIS — C50411 Malignant neoplasm of upper-outer quadrant of right female breast: Secondary | ICD-10-CM

## 2016-12-25 MED ORDER — TRASTUZUMAB CHEMO 150 MG IV SOLR
6.0000 mg/kg | Freq: Once | INTRAVENOUS | Status: AC
  Administered 2016-12-25: 357 mg via INTRAVENOUS
  Filled 2016-12-25: qty 17

## 2016-12-25 MED ORDER — DIPHENHYDRAMINE HCL 25 MG PO CAPS
ORAL_CAPSULE | ORAL | Status: AC
  Filled 2016-12-25: qty 1

## 2016-12-25 MED ORDER — DIPHENHYDRAMINE HCL 25 MG PO CAPS
50.0000 mg | ORAL_CAPSULE | Freq: Once | ORAL | Status: AC
  Administered 2016-12-25: 25 mg via ORAL

## 2016-12-25 MED ORDER — ACETAMINOPHEN 325 MG PO TABS
650.0000 mg | ORAL_TABLET | Freq: Once | ORAL | Status: AC
  Administered 2016-12-25: 650 mg via ORAL

## 2016-12-25 MED ORDER — ACETAMINOPHEN 325 MG PO TABS
ORAL_TABLET | ORAL | Status: AC
  Filled 2016-12-25: qty 2

## 2016-12-25 MED ORDER — SODIUM CHLORIDE 0.9 % IV SOLN
Freq: Once | INTRAVENOUS | Status: AC
  Administered 2016-12-25: 16:00:00 via INTRAVENOUS

## 2016-12-25 NOTE — Patient Instructions (Signed)
[facility] Discharge Instructions for Patients Receiving Chemotherapy  Today you received the following chemotherapy agent: Herceptin  To help prevent nausea and vomiting after your treatment, we encourage you to take your nausea medication as directed.   If you develop nausea and vomiting that is not controlled by your nausea medication, call the clinic.   BELOW ARE SYMPTOMS THAT SHOULD BE REPORTED IMMEDIATELY:  *FEVER GREATER THAN 100.5 F  *CHILLS WITH OR WITHOUT FEVER  NAUSEA AND VOMITING THAT IS NOT CONTROLLED WITH YOUR NAUSEA MEDICATION  *UNUSUAL SHORTNESS OF BREATH  *UNUSUAL BRUISING OR BLEEDING  TENDERNESS IN MOUTH AND THROAT WITH OR WITHOUT PRESENCE OF ULCERS  *URINARY PROBLEMS  *BOWEL PROBLEMS  UNUSUAL RASH Items with * indicate a potential emergency and should be followed up as soon as possible.  Feel free to call the clinic should you have any questions or concerns. The clinic phone number is [phone].  Please show the CHEMO ALERT CARD at check-in to the Emergency Department and triage nurse.   

## 2017-01-09 ENCOUNTER — Encounter: Payer: Self-pay | Admitting: Radiation Oncology

## 2017-01-09 ENCOUNTER — Ambulatory Visit
Admission: RE | Admit: 2017-01-09 | Discharge: 2017-01-09 | Disposition: A | Discharge status: Home / Self Care | Payer: PPO | Source: Ambulatory Visit | Attending: Radiation Oncology | Admitting: Radiation Oncology

## 2017-01-09 ENCOUNTER — Other Ambulatory Visit: Payer: Self-pay

## 2017-01-09 VITALS — BP 138/74 | HR 85 | Temp 98.4°F | Resp 18 | Ht 65.0 in | Wt 128.2 lb

## 2017-01-09 DIAGNOSIS — C50411 Malignant neoplasm of upper-outer quadrant of right female breast: Secondary | ICD-10-CM

## 2017-01-09 DIAGNOSIS — Z510 Encounter for antineoplastic radiation therapy: Secondary | ICD-10-CM | POA: Diagnosis not present

## 2017-01-09 DIAGNOSIS — Z170 Estrogen receptor positive status [ER+]: Principal | ICD-10-CM

## 2017-01-09 NOTE — Progress Notes (Signed)
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 During treatment she did very well with radiotherapy and did not have significant desquamation.  She will return on Tuesday for Herceptin.                           On review of systems, the patient states she's doing great. She denies any concerns with her skin at this time. No other complaints are noted.  ALLERGIES:  is allergic to erythromycin and macrobid [nitrofurantoin].  Meds: Current Outpatient Medications  Medication Sig Dispense Refill  . albuterol (PROVENTIL HFA;VENTOLIN HFA) 108 (90 Base) MCG/ACT inhaler Inhale 2 puffs into the lungs every 6 (six) hours as needed. 1 Inhaler 3  . Cholecalciferol (VITAMIN D) 2000 units CAPS Take 2,000 Units by mouth daily.    . clopidogrel (PLAVIX) 75 MG tablet Take 75 mg by mouth daily.      . Cyanocobalamin (B-12 PO) Take 1 tablet by mouth daily.    . metoprolol succinate (TOPROL-XL) 25 MG 24 hr tablet Take 12.5 mg by mouth daily.    . pravastatin (PRAVACHOL) 20 MG tablet Take 20 mg by mouth 2 (two) times a week.     . ranitidine (ZANTAC) 150 MG tablet Take 150 mg by mouth daily.    Marland Kitchen SPIRIVA HANDIHALER 18 MCG inhalation capsule INHALE CONTENTS OF ONE CAPSULE ONCE A DAY 30 capsule 5  . SYMBICORT 160-4.5 MCG/ACT inhaler TWO PUFFS TWICE DAILY 10.2 g 2  . trastuzumab (HERCEPTIN) 150 MG SOLR injection Inject into the vein.     No current facility-administered medications for this encounter.     Physical Findings:  height is 5\' 5"  (1.651 m) and weight is  128 lb 3.2 oz (58.2 kg). Her oral temperature is 98.4 F (36.9 C). Her blood pressure is 138/74 and her pulse is 85. Her respiration is 18 and oxygen saturation is 95%.  Pain Assessment Pain Score: 0-No pain/10 In general this is a well appearing caucasian female in no acute distress. She's alert and oriented x4 and appropriate throughout the examination. Cardiopulmonary assessment is negative for acute distress and she exhibits normal effort. The right breast was examined and reveals no evidence of desquamation and she has only mild hyperpigmentation.   Lab Findings: Lab Results  Component Value Date   WBC 4.5 11/13/2016   HGB 13.5 11/13/2016   HCT 40.5 11/13/2016   MCV 96.4 11/13/2016   PLT 223 11/13/2016     Radiographic Findings: No results found.  Impression/Plan: 1. 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 The patient has been doing well since completion of radiotherapy. We discussed that we would be happy to continue to follow her as needed, but she will also continue to follow up with Dr. Lindi York in medical oncology and will continue Herceptin and . She was counseled on skin care as well as measures to avoid sun exposure to this area.  2. Survivorship. The patient will follow up with Anna Ryder  Delice Bison, NP in survivorship clinic once she completes all breast cancer treatment.     Carola Rhine, PAC

## 2017-01-15 ENCOUNTER — Ambulatory Visit (HOSPITAL_BASED_OUTPATIENT_CLINIC_OR_DEPARTMENT_OTHER): Payer: PPO | Admitting: Adult Health

## 2017-01-15 ENCOUNTER — Other Ambulatory Visit: Payer: Self-pay | Admitting: Emergency Medicine

## 2017-01-15 ENCOUNTER — Ambulatory Visit (HOSPITAL_BASED_OUTPATIENT_CLINIC_OR_DEPARTMENT_OTHER): Payer: PPO

## 2017-01-15 ENCOUNTER — Other Ambulatory Visit (HOSPITAL_BASED_OUTPATIENT_CLINIC_OR_DEPARTMENT_OTHER): Payer: PPO

## 2017-01-15 ENCOUNTER — Other Ambulatory Visit: Payer: Self-pay | Admitting: Hematology and Oncology

## 2017-01-15 VITALS — BP 177/85 | HR 75 | Temp 97.9°F | Resp 17 | Ht 65.0 in | Wt 130.2 lb

## 2017-01-15 DIAGNOSIS — Z5112 Encounter for antineoplastic immunotherapy: Secondary | ICD-10-CM | POA: Diagnosis not present

## 2017-01-15 DIAGNOSIS — Z853 Personal history of malignant neoplasm of breast: Secondary | ICD-10-CM

## 2017-01-15 DIAGNOSIS — Z170 Estrogen receptor positive status [ER+]: Principal | ICD-10-CM

## 2017-01-15 DIAGNOSIS — C50411 Malignant neoplasm of upper-outer quadrant of right female breast: Secondary | ICD-10-CM

## 2017-01-15 LAB — CBC WITH DIFFERENTIAL/PLATELET
BASO%: 1 % (ref 0.0–2.0)
Basophils Absolute: 0 10*3/uL (ref 0.0–0.1)
EOS%: 0.6 % (ref 0.0–7.0)
Eosinophils Absolute: 0 10*3/uL (ref 0.0–0.5)
HCT: 40.9 % (ref 34.8–46.6)
HGB: 13.5 g/dL (ref 11.6–15.9)
LYMPH#: 0.5 10*3/uL — AB (ref 0.9–3.3)
LYMPH%: 10.9 % — AB (ref 14.0–49.7)
MCH: 31.4 pg (ref 25.1–34.0)
MCHC: 33 g/dL (ref 31.5–36.0)
MCV: 95.1 fL (ref 79.5–101.0)
MONO#: 0.6 10*3/uL (ref 0.1–0.9)
MONO%: 13 % (ref 0.0–14.0)
NEUT#: 3.3 10*3/uL (ref 1.5–6.5)
NEUT%: 74.5 % (ref 38.4–76.8)
Platelets: 232 10*3/uL (ref 145–400)
RBC: 4.3 10*6/uL (ref 3.70–5.45)
RDW: 13.8 % (ref 11.2–14.5)
WBC: 4.4 10*3/uL (ref 3.9–10.3)

## 2017-01-15 LAB — COMPREHENSIVE METABOLIC PANEL
ALBUMIN: 4 g/dL (ref 3.5–5.0)
ALK PHOS: 64 U/L (ref 40–150)
ALT: 20 U/L (ref 0–55)
AST: 20 U/L (ref 5–34)
Anion Gap: 11 mEq/L (ref 3–11)
BILIRUBIN TOTAL: 0.46 mg/dL (ref 0.20–1.20)
BUN: 12.7 mg/dL (ref 7.0–26.0)
CALCIUM: 9.3 mg/dL (ref 8.4–10.4)
CO2: 20 mEq/L — ABNORMAL LOW (ref 22–29)
CREATININE: 0.8 mg/dL (ref 0.6–1.1)
Chloride: 108 mEq/L (ref 98–109)
GLUCOSE: 93 mg/dL (ref 70–140)
Potassium: 3.9 mEq/L (ref 3.5–5.1)
SODIUM: 140 meq/L (ref 136–145)
TOTAL PROTEIN: 6.7 g/dL (ref 6.4–8.3)

## 2017-01-15 MED ORDER — DIPHENHYDRAMINE HCL 25 MG PO CAPS
ORAL_CAPSULE | ORAL | Status: AC
  Filled 2017-01-15: qty 1

## 2017-01-15 MED ORDER — DIPHENHYDRAMINE HCL 25 MG PO CAPS
50.0000 mg | ORAL_CAPSULE | Freq: Once | ORAL | Status: AC
  Administered 2017-01-15: 25 mg via ORAL

## 2017-01-15 MED ORDER — ACETAMINOPHEN 325 MG PO TABS
ORAL_TABLET | ORAL | Status: AC
  Filled 2017-01-15: qty 2

## 2017-01-15 MED ORDER — ACETAMINOPHEN 325 MG PO TABS
650.0000 mg | ORAL_TABLET | Freq: Once | ORAL | Status: AC
  Administered 2017-01-15: 650 mg via ORAL

## 2017-01-15 MED ORDER — SODIUM CHLORIDE 0.9 % IV SOLN
Freq: Once | INTRAVENOUS | Status: AC
  Administered 2017-01-15: 16:00:00 via INTRAVENOUS

## 2017-01-15 MED ORDER — TRASTUZUMAB CHEMO 150 MG IV SOLR
6.0000 mg/kg | Freq: Once | INTRAVENOUS | Status: AC
  Administered 2017-01-15: 357 mg via INTRAVENOUS
  Filled 2017-01-15: qty 17

## 2017-01-15 NOTE — Patient Instructions (Signed)
[facility] Discharge Instructions for Patients Receiving Chemotherapy  Today you received the following chemotherapy agents Herceptin  To help prevent nausea and vomiting after your treatment, we encourage you to take your nausea medication as needed   If you develop nausea and vomiting that is not controlled by your nausea medication, call the clinic.   BELOW ARE SYMPTOMS THAT SHOULD BE REPORTED IMMEDIATELY:  *FEVER GREATER THAN 100.5 F  *CHILLS WITH OR WITHOUT FEVER  NAUSEA AND VOMITING THAT IS NOT CONTROLLED WITH YOUR NAUSEA MEDICATION  *UNUSUAL SHORTNESS OF BREATH  *UNUSUAL BRUISING OR BLEEDING  TENDERNESS IN MOUTH AND THROAT WITH OR WITHOUT PRESENCE OF ULCERS  *URINARY PROBLEMS  *BOWEL PROBLEMS  UNUSUAL RASH Items with * indicate a potential emergency and should be followed up as soon as possible.  Feel free to call the clinic should you have any questions or concerns. The clinic phone number is [phone].  Please show the [facility] at check-in to the Emergency Department and triage nurse.

## 2017-01-15 NOTE — Assessment & Plan Note (Addendum)
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 6 months of Herceptin alone started 09/11/16 2. adjuvant radiation started 10/31/16 3. antiestrogen therapy with anastrozole ---------------------------------------------------------------------------- Current treatment: Herceptin  She will proceed with Herceptin today.  She will need to start Anastrozole as she has completed radiation.  I will send her billing issues to Alinda Dooms.    Due to her questions and concerns I will discuss her Herceptin with Dr. Lindi Adie and come up with a plan.

## 2017-01-15 NOTE — Progress Notes (Signed)
Anna York Follow up:    Crist Infante, MD [place] [address]   DIAGNOSIS: York Staging Malignant neoplasm of upper-outer quadrant of right breast in female, estrogen receptor positive ([place]) Staging form: Breast, AJCC 8th Edition - Clinical stage from 07/25/2016: Stage 0 (cTis (DCIS), cN0, cM0, ER: Positive, PR: Positive) - Unsigned - Pathologic: Stage Unknown (pT1c, pNX, cM0, G1, ER: Positive, PR: Positive, HER2: Positive) - Signed by Nicholas Lose, MD on 08/23/2016   SUMMARY OF ONCOLOGIC HISTORY:   Malignant neoplasm of upper-outer quadrant of right breast in female, estrogen receptor positive ([place])   1991 Initial Biopsy    Left Breast York treated with breast conserving surgery followed by chemotherapy and radiation, unknown receptor status and unknown chemotherapy regimen      07/18/2016 Initial Diagnosis    Right breast calcifications 4 mm size, biopsy revealed DCIS with calcifications low intermediate grade ER 100%, PR 50%, Tis Nx stage 0      08/09/2016 Surgery    Right lumpectomy: 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 female returns for evaluation prior to receiving Trastuzumab.  She is confused because she thought today or the next treatment would be her last.  She is tolerating the treatment well and has no concerns today.  She does have some questions about her bill she would like for me to answer.     Patient Active Problem List   Diagnosis Date Noted  . Dyspnea on exertion 11/12/2016  . Malignant neoplasm of upper-outer quadrant  of right breast in female, estrogen receptor positive ([place]) 07/25/2016  . Palate abnormality 05/17/2015  . COPD (chronic obstructive pulmonary disease) ([place]) 10/04/2011  . Atrophic vaginitis   . Osteoporosis   . ASCUS (atypical squamous cells of undetermined significance) on Pap smear   . York ([place])   . COLITIS 01/05/2010  . DIARRHEA 11/01/2009  . TIA 10/28/2009  . CONSTIPATION 10/28/2009  . CYSTITIS 10/28/2009  . OSTEOARTHRITIS 10/28/2009  . BACK PAIN 10/28/2009  . OSTEOPENIA 10/28/2009  . HEADACHE 10/28/2009  . PALPITATIONS 10/28/2009  . OTHER DYSPHAGIA 10/28/2009  . NEOPLASM, MALIGNANT, BREAST, HX OF 10/28/2009  . COLONIC POLYPS, HYPERPLASTIC, HX OF 10/28/2009    is allergic to erythromycin and macrobid [nitrofurantoin].  MEDICAL HISTORY: Past Medical History:  Diagnosis Date  . ASCUS (atypical squamous cells of undetermined significance) on Pap smear    Neg high risk HPV  . Asthma   . Atrophic vaginitis   . Backache, unspecified   . Breast York ([place]) 1992   Chemo and Radiation  . Constipation   . COPD (chronic obstructive pulmonary disease) ([place])   . Cystitis, unspecified   . Diverticulosis   . Dysphagia   . GERD (gastroesophageal reflux disease)    indigestion  . Headache(784.0)   . Lymphocytic colitis   . Osteoarthritis   . Osteopenia 2012   T score -2.1  . Osteoporosis   . Palpitations   . Personal history of colonic polyps 07/07/2002  hyperplastic  . Personal history of malignant neoplasm of breast   . Pneumonia   . Stroke [facility]) 2010   2 TIA's   . TIA (transient ischemic attack)   . Unspecified transient cerebral ischemia     SURGICAL HISTORY: Past Surgical History:  Procedure Laterality Date  . BREAST BIOPSY    . BREAST LUMPECTOMY     chemo and radiation  . BREAST LUMPECTOMY WITH RADIOACTIVE SEED LOCALIZATION Right 08/09/2016   Procedure: RIGHT BREAST LUMPECTOMY WITH RADIOACTIVE SEED LOCALIZATION;  Surgeon: [name], MD;  Location: [facility];  Service: General;  Laterality: Right;  . CARDIAC CATHETERIZATION    . CATARACT EXTRACTION    . COLONOSCOPY  2011   diverticulosis  . COLPOSCOPY    . FACIAL COSMETIC SURGERY    . LYMPHADENECTOMY  1993  . TONSILLECTOMY      SOCIAL HISTORY: Social History   Socioeconomic History  . Marital status: Married    Spouse name: Not on file  . Number of children: 1  . Years of education: Not on file  . Highest education level: Not on file  Social Needs  . Financial resource strain: Not on file  . Food insecurity - worry: Not on file  . Food insecurity - inability: Not on file  . Transportation needs - medical: Not on file  . Transportation needs - non-medical: Not on file  Occupational History  . Occupation: realtor    Comment: [name]    Employer: [employer]  Tobacco Use  . Smoking status: Former Smoker    Types: Cigarettes    Last attempt to quit: 02/20/2000    Years since quitting: 16.9  . Smokeless tobacco: Never Used  Substance and Sexual Activity  . Alcohol use: Yes    Alcohol/week: 8.4 oz    Types: 14 Glasses of wine per week    Comment: daily  . Drug use: No  . Sexual activity: Yes    Birth control/protection: Post-menopausal    Comment: 1st intercourse 76 yo-Fewer than 5 partners  Other Topics Concern  . Not on file  Social History Narrative   Married to Dr Ethelle Lyon    FAMILY HISTORY: Family History  Problem Relation Age of Onset  . Breast York Mother        Age 65  . Hypertension Father     Review of Systems  Constitutional: Negative for appetite change, chills, fatigue, fever and unexpected weight change.  HENT:   Negative for hearing loss, lump/mass, sore throat and trouble swallowing.   Eyes: Negative for eye problems and icterus.  Respiratory: Negative for chest tightness, cough and shortness of breath.   Cardiovascular: Negative for chest pain, leg swelling and palpitations.  Gastrointestinal: Negative for abdominal distention,  abdominal pain, constipation, diarrhea, nausea and vomiting.  Endocrine: Negative for hot flashes.  Genitourinary: Negative for difficulty urinating.   Musculoskeletal: Negative for arthralgias.  Skin: Negative for itching and rash.  Neurological: Negative for dizziness, extremity weakness, headaches and numbness.  Hematological: Negative for adenopathy. Does not bruise/bleed easily.  Psychiatric/Behavioral: Negative for depression. The patient is not nervous/anxious.       PHYSICAL EXAMINATION  ECOG PERFORMANCE STATUS: 1 - Symptomatic but completely ambulatory  Vitals:   01/15/17 1447  BP: (!) 177/85  Pulse: 75  Resp: 17  Temp: 97.9 F (36.6 C)  SpO2: 96%    Physical Exam  Constitutional: She is oriented to person, place, and time and well-developed, well-nourished, and in no distress.  HENT:  Head: Normocephalic and atraumatic.  Mouth/Throat: No oropharyngeal exudate.  Eyes: Pupils are equal, round, and reactive to light. No scleral icterus.  Neck: Neck supple.  Cardiovascular: Normal rate, regular rhythm and normal heart sounds.  Pulmonary/Chest: Effort normal and breath sounds normal. No respiratory distress. She has no wheezes. She has no rales.  Abdominal: Soft. Bowel sounds are normal. She exhibits no distension and no mass. There is no tenderness. There is no rebound and no guarding.  Musculoskeletal: She exhibits no edema.  Lymphadenopathy:    She has no cervical adenopathy.  Neurological: She is alert and oriented to person, place, and time.  Skin: Skin is warm and dry. No rash noted.  Psychiatric: Mood and affect normal.    LABORATORY DATA:  CBC    Component Value Date/Time   WBC 4.4 01/15/2017 1427   WBC 6.0 08/09/2016 0602   RBC 4.30 01/15/2017 1427   RBC 4.38 08/09/2016 0602   HGB 13.5 01/15/2017 1427   HCT 40.9 01/15/2017 1427   PLT 232 01/15/2017 1427   MCV 95.1 01/15/2017 1427   MCH 31.4 01/15/2017 1427   MCH 31.1 08/09/2016 0602   MCHC 33.0  01/15/2017 1427   MCHC 31.9 08/09/2016 0602   RDW 13.8 01/15/2017 1427   LYMPHSABS 0.5 (L) 01/15/2017 1427   MONOABS 0.6 01/15/2017 1427   EOSABS 0.0 01/15/2017 1427   BASOSABS 0.0 01/15/2017 1427    CMP     Component Value Date/Time   NA 140 01/15/2017 1427   K 3.9 01/15/2017 1427   CL 105 08/09/2016 0602   CO2 20 (L) 01/15/2017 1427   GLUCOSE 93 01/15/2017 1427   BUN 12.7 01/15/2017 1427   CREATININE 0.8 01/15/2017 1427   CALCIUM 9.3 01/15/2017 1427   PROT 6.7 01/15/2017 1427   ALBUMIN 4.0 01/15/2017 1427   AST 20 01/15/2017 1427   ALT 20 01/15/2017 1427   ALKPHOS 64 01/15/2017 1427   BILITOT 0.46 01/15/2017 1427   GFRNONAA >60 08/09/2016 0602   GFRAA >60 08/09/2016 0602       ASSESSMENT and PLAN:   Malignant neoplasm of upper-outer quadrant of right breast in female, estrogen receptor positive ([place]) 08/09/2016 Right lumpectomy:Grade 1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, with DCIS with calcifications, ER 100%, PR 95%, Ki-67 3%, HER-2 positive ratio 2.3, T1c Nx stage IA (1993 breast York diagnosis treated with breast conserving surgery with radiation)  Treatment plan:  1. 6 months of Herceptin alone started 09/11/16 2. adjuvant radiation started 10/31/16 3. antiestrogen therapy with anastrozole ---------------------------------------------------------------------------- Current treatment: Herceptin  She will proceed with Herceptin today.  She will need to start Anastrozole as she has completed radiation.  I will send her billing issues to Alinda Dooms.    Due to her questions and concerns I will discuss her Herceptin with Dr. Lindi Adie and come up with a plan.     All questions were answered. The patient knows to call the clinic with any problems, questions or concerns. We can certainly see the patient much sooner if necessary.  A total of (30) minutes of face-to-face time was spent with this patient with greater than 50% of that time in counseling and  care-coordination.  This note was electronically signed. Scot Dock, NP 01/17/2017

## 2017-01-16 ENCOUNTER — Telehealth: Payer: Self-pay | Admitting: Adult Health

## 2017-01-16 NOTE — Telephone Encounter (Signed)
Scheduled appt per 11/27 los - left message with husband that appts were scheduled. Sent reminder letter in the mail with appt date and time.

## 2017-01-17 ENCOUNTER — Other Ambulatory Visit: Payer: Self-pay

## 2017-01-17 ENCOUNTER — Encounter: Payer: Self-pay | Admitting: Adult Health

## 2017-01-17 MED ORDER — ANASTROZOLE 1 MG PO TABS: 1.0000 mg | ORAL_TABLET | Freq: Every day | ORAL | 5 refills | Status: DC

## 2017-01-23 ENCOUNTER — Telehealth: Payer: Self-pay | Admitting: Hematology and Oncology

## 2017-01-23 NOTE — Telephone Encounter (Signed)
Scheduled appt per 11/30 sch msg - spoke with patient regarding appts that were added.

## 2017-02-04 NOTE — Assessment & Plan Note (Signed)
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 adjuvant radiation started 10/31/16-11/28/16 3. antiestrogen therapy with anastrozole ---------------------------------------------------------------------------- Current treatment: Herceptin  She will proceed with Herceptin today.  Anastrozole Counseling:  RTC in 6 weeks for follow up

## 2017-02-05 ENCOUNTER — Ambulatory Visit (HOSPITAL_BASED_OUTPATIENT_CLINIC_OR_DEPARTMENT_OTHER): Payer: PPO

## 2017-02-05 ENCOUNTER — Ambulatory Visit (HOSPITAL_BASED_OUTPATIENT_CLINIC_OR_DEPARTMENT_OTHER): Payer: PPO | Admitting: Hematology and Oncology

## 2017-02-05 DIAGNOSIS — C50411 Malignant neoplasm of upper-outer quadrant of right female breast: Secondary | ICD-10-CM | POA: Diagnosis not present

## 2017-02-05 DIAGNOSIS — Z5112 Encounter for antineoplastic immunotherapy: Secondary | ICD-10-CM | POA: Diagnosis not present

## 2017-02-05 DIAGNOSIS — Z170 Estrogen receptor positive status [ER+]: Secondary | ICD-10-CM

## 2017-02-05 DIAGNOSIS — R0602 Shortness of breath: Secondary | ICD-10-CM

## 2017-02-05 DIAGNOSIS — Z79811 Long term (current) use of aromatase inhibitors: Secondary | ICD-10-CM | POA: Diagnosis not present

## 2017-02-05 MED ORDER — SODIUM CHLORIDE 0.9 % IV SOLN
Freq: Once | INTRAVENOUS | Status: AC
  Administered 2017-02-05: 15:00:00 via INTRAVENOUS

## 2017-02-05 MED ORDER — TRASTUZUMAB CHEMO 150 MG IV SOLR
6.0000 mg/kg | Freq: Once | INTRAVENOUS | Status: AC
  Administered 2017-02-05: 357 mg via INTRAVENOUS
  Filled 2017-02-05: qty 17

## 2017-02-05 MED ORDER — DIPHENHYDRAMINE HCL 25 MG PO CAPS
50.0000 mg | ORAL_CAPSULE | Freq: Once | ORAL | Status: AC
  Administered 2017-02-05: 25 mg via ORAL

## 2017-02-05 MED ORDER — ACETAMINOPHEN 325 MG PO TABS
ORAL_TABLET | ORAL | Status: AC
  Filled 2017-02-05: qty 2

## 2017-02-05 MED ORDER — DIPHENHYDRAMINE HCL 25 MG PO CAPS
ORAL_CAPSULE | ORAL | Status: AC
  Filled 2017-02-05: qty 1

## 2017-02-05 MED ORDER — ACETAMINOPHEN 325 MG PO TABS
650.0000 mg | ORAL_TABLET | Freq: Once | ORAL | Status: AC
  Administered 2017-02-05: 650 mg via ORAL

## 2017-02-05 NOTE — Progress Notes (Signed)
Patient Care Team: Anna Infante, MD as PCP - General (Internal Medicine) Anna Overall, MD as Consulting Physician (General Surgery) Anna York, Anna Dad, MD as Consulting Physician (Oncology) Anna Rudd, MD as Consulting Physician (Radiation Oncology)  DIAGNOSIS:  Encounter Diagnosis  Name Primary?  . Malignant neoplasm of upper-outer quadrant of right breast in female, estrogen receptor positive (Anna York)     SUMMARY OF ONCOLOGIC HISTORY:   Malignant neoplasm of upper-outer quadrant of right breast in female, estrogen receptor positive (Anna York)   1991 Initial Biopsy    Left Breast cancer treated with breast conserving surgery followed by chemotherapy and radiation, unknown receptor status and unknown chemotherapy regimen      07/18/2016 Initial Diagnosis    Right breast calcifications 4 mm size, biopsy revealed DCIS with calcifications low intermediate grade ER 100%, PR 50%, Tis Nx stage 0      08/09/2016 Surgery    Right lumpectomy: 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, with DCIS with calcifications, ER 100%, PR 95%, Ki-67 3%, HER-2 positive ratio 2.3, T1c Nx stage IA      09/11/2016 -  Chemotherapy    Herceptin every 3 weeks 6 months       10/31/2016 - 11/28/2016 Radiation Therapy    Adjuvant Radiation [facility]): Right breast / 42.56 Gy in 16 fractions  Boost: 10 Gy in 4 fractions        01/08/2017 -  Anti-estrogen oral therapy    Anastrozole 1 mg daily       CHIEF COMPLIANT: Follow-up on anastrozole and Herceptin  INTERVAL HISTORY: Anna York is a 76 year old with above-mentioned history of right breast cancer treated with lumpectomy and is currently on adjuvant therapy with Herceptin along with anastrozole.  She is tolerating Herceptin extremely well.  She has not started anastrozole therapy.  She tells me that her breathing has gotten worse since she started Herceptin.  She sees Dr. Lamonte York for her lungs.  REVIEW OF SYSTEMS:   Constitutional: Denies fevers, chills or  abnormal weight loss Eyes: Denies blurriness of vision Ears, nose, mouth, throat, and face: Denies mucositis or sore throat Respiratory: Shortness of breath to exertion Cardiovascular: Denies palpitation, chest discomfort Gastrointestinal:  Denies nausea, heartburn or change in bowel habits Skin: Denies abnormal skin rashes Lymphatics: Denies new lymphadenopathy or easy bruising Neurological:Denies numbness, tingling or new weaknesses Behavioral/Psych: Mood is stable, no new changes  Extremities: No lower extremity edema Breast:  denies any pain or lumps or nodules in either breasts All other systems were reviewed with the patient and are negative.  I have reviewed the past medical history, past surgical history, social history and family history with the patient and they are unchanged from previous note.  ALLERGIES:  is allergic to erythromycin and macrobid [nitrofurantoin].  MEDICATIONS:  Current Outpatient Medications  Medication Sig Dispense Refill  . albuterol (PROVENTIL HFA;VENTOLIN HFA) 108 (90 Base) MCG/ACT inhaler Inhale 2 puffs into the lungs every 6 (six) hours as needed. 1 Inhaler 3  . anastrozole (ARIMIDEX) 1 MG tablet Take 1 tablet (1 mg total) by mouth daily. 30 tablet 5  . Cholecalciferol (VITAMIN D) 2000 units CAPS Take 2,000 Units by mouth daily.    . clopidogrel (PLAVIX) 75 MG tablet Take 75 mg by mouth daily.      . Cyanocobalamin (B-12 PO) Take 1 tablet by mouth daily.    . metoprolol succinate (TOPROL-XL) 25 MG 24 hr tablet Take 12.5 mg by mouth daily.    Marland Kitchen nystatin (MYCOSTATIN) 100000 UNIT/ML suspension     .  pravastatin (PRAVACHOL) 20 MG tablet Take 20 mg by mouth 2 (two) times a week.     . ranitidine (ZANTAC) 150 MG tablet Take 150 mg by mouth daily.    Marland Kitchen SPIRIVA HANDIHALER 18 MCG inhalation capsule INHALE CONTENTS OF ONE CAPSULE ONCE A DAY 30 capsule 5  . SYMBICORT 160-4.5 MCG/ACT inhaler TWO PUFFS TWICE DAILY 10.2 g 5  . trastuzumab (HERCEPTIN) 150 MG SOLR  injection Inject into the vein.     No current facility-administered medications for this visit.     PHYSICAL EXAMINATION: ECOG PERFORMANCE STATUS: 1 - Symptomatic but completely ambulatory  Vitals:   02/05/17 1418  BP: (!) 153/76  Pulse: (!) 117  Resp: 18  Temp: (!) 97.3 F (36.3 C)  SpO2: 94%   Filed Weights   02/05/17 1418  Weight: 130 lb 4.8 oz (59.1 kg)    GENERAL:alert, no distress and comfortable SKIN: skin color, texture, turgor are normal, no rashes or significant lesions EYES: normal, Conjunctiva are pink and non-injected, sclera clear OROPHARYNX:no exudate, no erythema and lips, buccal mucosa, and tongue normal  NECK: supple, thyroid normal size, non-tender, without nodularity LYMPH:  no palpable lymphadenopathy in the cervical, axillary or inguinal LUNGS: clear to auscultation and percussion with normal breathing effort HEART: regular rate & rhythm and no murmurs and no lower extremity edema ABDOMEN:abdomen soft, non-tender and normal bowel sounds MUSCULOSKELETAL:no cyanosis of digits and no clubbing  NEURO: alert & oriented x 3 with fluent speech, no focal motor/sensory deficits EXTREMITIES: No lower extremity edema  LABORATORY DATA:  I have reviewed the data as listed   Chemistry      Component Value Date/Time   NA 140 01/15/2017 1427   K 3.9 01/15/2017 1427   CL 105 08/09/2016 0602   CO2 20 (L) 01/15/2017 1427   BUN 12.7 01/15/2017 1427   CREATININE 0.8 01/15/2017 1427      Component Value Date/Time   CALCIUM 9.3 01/15/2017 1427   ALKPHOS 64 01/15/2017 1427   AST 20 01/15/2017 1427   ALT 20 01/15/2017 1427   BILITOT 0.46 01/15/2017 1427       Lab Results  Component Value Date   WBC 4.4 01/15/2017   HGB 13.5 01/15/2017   HCT 40.9 01/15/2017   MCV 95.1 01/15/2017   PLT 232 01/15/2017   NEUTROABS 3.3 01/15/2017    ASSESSMENT & PLAN:  Malignant neoplasm of upper-outer quadrant of right breast in female, estrogen receptor positive  ([place]) 08/09/2016 Right lumpectomy: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 adjuvant radiation started 10/31/16-11/28/16 3. antiestrogen therapy with anastrozole ---------------------------------------------------------------------------- Current treatment: Herceptin  She will proceed with Herceptin today.  I discussed with the patient that I would recommend 1 year of Herceptin treatment which is different than my earlier recommendation of using it only for 6 months.  Earlier recommendation was based upon a European study would seem to suggest that 6 months was equal and to 1 year.  However that European study used chemotherapy that consisted of anthracyclines.  Because we are not using the similar regimen, I would suggest sticking to the standard recommendation of 1 year of anti-HER-2 therapy. However patient is experiencing worsening shortness of breath and she attributes this to Herceptin therapy.  Her last echocardiogram done in October  showed an ejection fraction of 60-65%.  She has an appointment to see Dr. Lamonte York in January.  I sent a message to Dr. Lamonte York requesting for PFTs.  If the PFTs show significant worsening of her lung function then we will discontinue Herceptin.  However if the PFTs remain stable then we can continue and finish 1 year of anti-HER-2 therapy with Herceptin.  Patient has not started anti-estrogen therapy.  We will wait till January 8th to decide on the treatment plan and initiate antiestrogen therapy at that time.  Monitoring closely for toxicities RTC every 3 weeks for Herceptin follow-up with me on January 8.   I spent 25 minutes talking to the patient of which more than half was spent in counseling and coordination  of care.  No orders of the defined types were placed in this encounter.  The patient has a good understanding of the York plan. she agrees with it. she will call with any problems that may develop before the next visit here.   Rulon Eisenmenger, MD 02/05/17

## 2017-02-06 ENCOUNTER — Telehealth: Payer: Self-pay | Admitting: Hematology and Oncology

## 2017-02-06 NOTE — Telephone Encounter (Signed)
Spoke to patient regarding upcoming January 2019 appointments.  °

## 2017-02-25 ENCOUNTER — Ambulatory Visit: Payer: PPO | Admitting: Emergency Medicine

## 2017-02-25 ENCOUNTER — Encounter: Payer: Self-pay | Admitting: Emergency Medicine

## 2017-02-25 VITALS — BP 120/62 | HR 92 | Ht 65.5 in | Wt 129.0 lb

## 2017-02-25 DIAGNOSIS — J449 Chronic obstructive pulmonary disease, unspecified: Secondary | ICD-10-CM

## 2017-02-25 DIAGNOSIS — J984 Other disorders of lung: Secondary | ICD-10-CM

## 2017-02-25 DIAGNOSIS — R0609 Other forms of dyspnea: Secondary | ICD-10-CM | POA: Diagnosis not present

## 2017-02-25 DIAGNOSIS — R0602 Shortness of breath: Secondary | ICD-10-CM

## 2017-02-25 NOTE — Assessment & Plan Note (Signed)
Unclear to me whether this relates to a progression of her COPD which is certainly possible.  She relates that in time to initiation of the Herceptin.  She is also at some risk for radiation pneumonitis although I do not see evidence for this on earlier chest x-rays.  I will perform a CT scan of the chest to evaluate for any interval change in interstitial disease, evidence for pneumonitis.  We will repeat her pulmonary function testing.  Depending on the results I will discuss with Dr Lindi Adie the pros and cons of continuing the Herceptin.

## 2017-02-25 NOTE — Addendum Note (Signed)
Addended by: Desmond Dike C on: 02/25/2017 02:18 PM   Modules accepted: Orders

## 2017-02-25 NOTE — Assessment & Plan Note (Signed)
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 adjuvant radiation started 10/31/16-11/28/16 3. antiestrogen therapy with anastrozole started 01/17/17 ---------------------------------------------------------------------------- Current treatment: Herceptin  She will proceed with Herceptin today.  Anastrozole Toxicities:  RTC in 6 weeks for follow up

## 2017-02-25 NOTE — Patient Instructions (Addendum)
We will repeat your pulmonary function testing We will perform a CT scan of your chest without contrast to evaluate interstitial scarring Continue spiriva and symbicort as you are taking them  Use albuterol 2 puffs if needed for shortness of breath.  Follow with Dr Lamonte Sakai next available after the testing is done to review the results.

## 2017-02-25 NOTE — Assessment & Plan Note (Signed)
Continue current Spiriva, Symbicort Repeat pulmonary function testing as above Likely repeat her walking oximetry on room air at her next visit.

## 2017-02-25 NOTE — Progress Notes (Signed)
Subjective:    Patient ID: Anna York, female    DOB: Aug 30, 1940, 77 y.o.   MRN: [id number] HPI 77 yo woman, former smoker (20 pk-yrs), hx breast CA, cerebrovascular disease, ? ASD (hasn't had TEE, TTE on 09/04/11 did not see it, PASP estimated 30-96mHg), HTN. She has been told that she had asthma in the past - seemed to be related to pet hair exposure, paint fumes. On these (rare) occasions, she responded well to albuterol. More recently she has been under eval by Dr Joylene Draft for exertional SOB. She recalls that her exertional tolerance has always been less than her peers, but now she has noticed that she has trouble with stairs, a brisk walk, especially if she is also talking. She reports occasional wheezing. Her CT scan chest 09/11/11 report is available - shows no PE, emphysematous changes. For this reason, a1-AT testing was done, not available yet.  She has stopped exercising about 2 yrs ago, has gained about 7 lbs.   ROV 10/18/11 -- f/u for chronic and episodic dyspnea, ? COPD with emphysematous change seen on imaging. Returns today after PFT >> moderately severe AFL, no BD response, hyperinflated RV, decreased DLCO that corrects for Va. She tried the albuterol and didn't notice very much. Her a-AT testing was normal.    PULMONARY FUNCTON TEST 10/18/2011  FVC 2.53  FEV1 1.03  FEV1/FVC 40.7  FVC  % Predicted 86  FEV % Predicted 49  FeF 25-75 .34  FeF 25-75 % Predicted 2.35    ROV 11/16/15 -- Is a follow-up visit for obstructive lung disease as indicated by pulmonary function testing and emphysematous change on chest CT. Also notes that she has a history of a palatine lesion on exam. She states that her breathing is stable, about the same as before. Has been managed on symbicort and spiriva. Uses albuterol occasionally.  No exacerbations since last time. She notes that the cost of Spiriva and Symbicort has been difficult  ROV 05/16/16 -- Anna York follows up today for her history of  COPD, emphysema. At our last visit in September we tried changing her bronchodilators to [facility]. She did not feel that the Stiolto was as beneficial and so she is now back on Symbicort plus Spiriva. She had a URI since our last visit, was treated with pred + abx in January by Dr Joylene Draft. In aftermath she had some persistent cough. Now improved. She is close to her baseline currently - she has had some need for her albuterol over thelast 2 weeks.   ROV 11/12/16 -- patient has a history of COPD, breast cancer. She is getting XRT thru mid October, is on herceptin. She feels dyspnea and fatigue after she takes the chemo. She remains on spiriva and symbicort. No chest tightness, wheeze. No cough, although she did have a URI a few weeks ago, now improved.   ROV 02/25/17 --this is a follow-up visit for patient with a history of emphysematous COPD.  She also has breast cancer, received radiation therapy and Herceptin. She is completing a year of herceptin. Currently managed on Spiriva and Symbicort. She had a CXR at last visit > stable scarring present. She feels that her breathing has been worse on herceptin. Worse today than last visit. No wheeze. Uses albuterol occasionally,       Objective:   Physical Exam Vitals:   02/25/17 1353  BP: 120/62  Pulse: 92  SpO2: 95%  Weight: 129 lb (58.5 kg)  Height: 5' 5.5" (1.664 m)  Gen: Pleasant, well-nourished, in no distress,  normal affect  ENT: No lesions,    Neck: No JVD, no TMG, no carotid bruits  Lungs: No use of accessory muscles, no wheeze   Cardiovascular: RRR, heart sounds normal, no murmur or gallops, no peripheral edema  Musculoskeletal: No deformities, no cyanosis or clubbing  Neuro: alert, non focal  Skin: Warm, no lesions or rashes      Assessment & Plan:  Dyspnea on exertion Unclear to me whether this relates to a progression of her COPD which is certainly possible.  She relates that in time to initiation of the Herceptin.  She is  also at some risk for radiation pneumonitis although I do not see evidence for this on earlier chest x-rays.  I will perform a CT scan of the chest to evaluate for any interval change in interstitial disease, evidence for pneumonitis.  We will repeat her pulmonary function testing.  Depending on the results I will discuss with Dr Lindi Adie the pros and cons of continuing the Herceptin.  COPD (chronic obstructive pulmonary disease) Continue current Spiriva, Symbicort Repeat pulmonary function testing as above Likely repeat her walking oximetry on room air at her next visit.  Baltazar Apo, MD, PhD 02/25/2017, 2:16 PM [facility] [phone] or if no answer [phone]

## 2017-02-26 ENCOUNTER — Inpatient Hospital Stay: Payer: PPO

## 2017-02-26 ENCOUNTER — Telehealth: Payer: Self-pay | Admitting: Hematology and Oncology

## 2017-02-26 ENCOUNTER — Ambulatory Visit: Payer: PPO | Admitting: Hematology and Oncology

## 2017-02-26 ENCOUNTER — Inpatient Hospital Stay: Payer: PPO | Attending: Hematology and Oncology | Admitting: Hematology and Oncology

## 2017-02-26 ENCOUNTER — Other Ambulatory Visit: Payer: Self-pay | Admitting: Hematology and Oncology

## 2017-02-26 ENCOUNTER — Ambulatory Visit: Payer: PPO

## 2017-02-26 DIAGNOSIS — Z170 Estrogen receptor positive status [ER+]: Secondary | ICD-10-CM | POA: Insufficient documentation

## 2017-02-26 DIAGNOSIS — R0602 Shortness of breath: Secondary | ICD-10-CM | POA: Insufficient documentation

## 2017-02-26 DIAGNOSIS — C50411 Malignant neoplasm of upper-outer quadrant of right female breast: Secondary | ICD-10-CM

## 2017-02-26 DIAGNOSIS — Z5112 Encounter for antineoplastic immunotherapy: Secondary | ICD-10-CM | POA: Insufficient documentation

## 2017-02-26 MED ORDER — DIPHENHYDRAMINE HCL 25 MG PO CAPS
ORAL_CAPSULE | ORAL | Status: AC
  Filled 2017-02-26: qty 2

## 2017-02-26 MED ORDER — ANASTROZOLE 1 MG PO TABS: 1.0000 mg | ORAL_TABLET | Freq: Every day | ORAL | 3 refills | Status: DC

## 2017-02-26 MED ORDER — TRASTUZUMAB CHEMO 150 MG IV SOLR
6.0000 mg/kg | Freq: Once | INTRAVENOUS | Status: AC
  Administered 2017-02-26: 357 mg via INTRAVENOUS
  Filled 2017-02-26: qty 17

## 2017-02-26 MED ORDER — DIPHENHYDRAMINE HCL 25 MG PO CAPS
50.0000 mg | ORAL_CAPSULE | Freq: Once | ORAL | Status: AC
  Administered 2017-02-26: 25 mg via ORAL

## 2017-02-26 MED ORDER — SODIUM CHLORIDE 0.9 % IV SOLN
Freq: Once | INTRAVENOUS | Status: AC
  Administered 2017-02-26: 15:00:00 via INTRAVENOUS

## 2017-02-26 MED ORDER — ACETAMINOPHEN 325 MG PO TABS
ORAL_TABLET | ORAL | Status: AC
  Filled 2017-02-26: qty 2

## 2017-02-26 MED ORDER — ACETAMINOPHEN 325 MG PO TABS
650.0000 mg | ORAL_TABLET | Freq: Once | ORAL | Status: AC
  Administered 2017-02-26: 650 mg via ORAL

## 2017-02-26 NOTE — Telephone Encounter (Signed)
Gave patient AVS and calendar of upcoming January through April appointments.  °

## 2017-02-26 NOTE — Patient Instructions (Signed)
[facility] Discharge Instructions for Patients Receiving Chemotherapy  Today you received the following chemotherapy agents Herceptin  To help prevent nausea and vomiting after your treatment, we encourage you to take your nausea medication as directed   If you develop nausea and vomiting that is not controlled by your nausea medication, call the clinic.   BELOW ARE SYMPTOMS THAT SHOULD BE REPORTED IMMEDIATELY:  *FEVER GREATER THAN 100.5 F  *CHILLS WITH OR WITHOUT FEVER  NAUSEA AND VOMITING THAT IS NOT CONTROLLED WITH YOUR NAUSEA MEDICATION  *UNUSUAL SHORTNESS OF BREATH  *UNUSUAL BRUISING OR BLEEDING  TENDERNESS IN MOUTH AND THROAT WITH OR WITHOUT PRESENCE OF ULCERS  *URINARY PROBLEMS  *BOWEL PROBLEMS  UNUSUAL RASH Items with * indicate a potential emergency and should be followed up as soon as possible.  Feel free to call the clinic should you have any questions or concerns. The clinic phone number is [phone].  Please show the CHEMO ALERT CARD at check-in to the Emergency Department and triage nurse.   

## 2017-02-26 NOTE — Progress Notes (Signed)
Patient Care Team: Crist Infante, MD as PCP - General (Internal Medicine) [name], MD as Consulting Physician (General Surgery) Magrinat, [name], MD as Consulting Physician (Oncology) Kyung Rudd, MD as Consulting Physician (Radiation Oncology)  DIAGNOSIS:  Encounter Diagnosis  Name Primary?  . Malignant neoplasm of upper-outer quadrant of right breast in female, estrogen receptor positive ([place])     SUMMARY OF ONCOLOGIC HISTORY:   Malignant neoplasm of upper-outer quadrant of right breast in female, estrogen receptor positive ([place])   1991 Initial Biopsy    Left Breast cancer treated with breast conserving surgery followed by chemotherapy and radiation, unknown receptor status and unknown chemotherapy regimen      07/18/2016 Initial Diagnosis    Right breast calcifications 4 mm size, biopsy revealed DCIS with calcifications low intermediate grade ER 100%, PR 50%, Tis Nx stage 0      08/09/2016 Surgery    Right lumpectomy: 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, with DCIS with calcifications, ER 100%, PR 95%, Ki-67 3%, HER-2 positive ratio 2.3, T1c Nx stage IA      09/11/2016 -  Chemotherapy    Herceptin every 3 weeks  1 year       10/31/2016 - 11/28/2016 Radiation Therapy    Adjuvant Radiation [facility]): Right breast / 42.56 Gy in 16 fractions  Boost: 10 Gy in 4 fractions        02/26/2017 -  Anti-estrogen oral therapy    Anastrozole 1 mg daily       CHIEF COMPLIANT: Follow-up on Herceptin and anastrozole  INTERVAL HISTORY: Anna York is a 77 year old with above-mentioned history of right breast cancer underwent lumpectomy and is currently on Herceptin every 3 weeks.  Patient is convinced that Herceptin is causing her shortness of breath.  She is getting pulmonary function test done next month along with CT of her chest.  REVIEW OF SYSTEMS:   Constitutional: Denies fevers, chills or abnormal weight loss Eyes: Denies blurriness of vision Ears, nose, mouth,  throat, and face: Denies mucositis or sore throat Respiratory: Shortness of breath to minimal exertion Cardiovascular: Denies palpitation, chest discomfort Gastrointestinal:  Denies nausea, heartburn or change in bowel habits Skin: Denies abnormal skin rashes Lymphatics: Denies new lymphadenopathy or easy bruising Neurological:Denies numbness, tingling or new weaknesses Behavioral/Psych: Mood is stable, no new changes  Extremities: No lower extremity edema All other systems were reviewed with the patient and are negative.  I have reviewed the past medical history, past surgical history, social history and family history with the patient and they are unchanged from previous note.  ALLERGIES:  is allergic to erythromycin and macrobid [nitrofurantoin].  MEDICATIONS:  Current Outpatient Medications  Medication Sig Dispense Refill  . albuterol (PROVENTIL HFA;VENTOLIN HFA) 108 (90 Base) MCG/ACT inhaler Inhale 2 puffs into the lungs every 6 (six) hours as needed. 1 Inhaler 3  . anastrozole (ARIMIDEX) 1 MG tablet Take 1 tablet (1 mg total) by mouth daily. 90 tablet 3  . Cholecalciferol (VITAMIN D) 2000 units CAPS Take 2,000 Units by mouth daily.    . clopidogrel (PLAVIX) 75 MG tablet Take 75 mg by mouth daily.      . Cyanocobalamin (B-12 PO) Take 1 tablet by mouth daily.    . metoprolol succinate (TOPROL-XL) 25 MG 24 hr tablet Take 12.5 mg by mouth daily.    Marland Kitchen nystatin (MYCOSTATIN) 100000 UNIT/ML suspension     . pravastatin (PRAVACHOL) 20 MG tablet Take 20 mg by mouth 2 (two) times a week.     Marland Kitchen  ranitidine (ZANTAC) 150 MG tablet Take 150 mg by mouth daily.    Marland Kitchen SPIRIVA HANDIHALER 18 MCG inhalation capsule INHALE CONTENTS OF ONE CAPSULE ONCE A DAY 30 capsule 5  . SYMBICORT 160-4.5 MCG/ACT inhaler TWO PUFFS TWICE DAILY 10.2 g 5  . trastuzumab (HERCEPTIN) 150 MG SOLR injection Inject into the vein.     No current facility-administered medications for this visit.     PHYSICAL  EXAMINATION: ECOG PERFORMANCE STATUS: 1 - Symptomatic but completely ambulatory  Vitals:   02/26/17 1416  BP: (!) 147/81  Pulse: 83  Resp: 16  Temp: 98.6 F (37 C)  SpO2: 94%   Filed Weights   02/26/17 1416  Weight: 129 lb (58.5 kg)    GENERAL:alert, no distress and comfortable SKIN: skin color, texture, turgor are normal, no rashes or significant lesions EYES: normal, Conjunctiva are pink and non-injected, sclera clear OROPHARYNX:no exudate, no erythema and lips, buccal mucosa, and tongue normal  NECK: supple, thyroid normal size, non-tender, without nodularity LYMPH:  no palpable lymphadenopathy in the cervical, axillary or inguinal LUNGS: clear to auscultation and percussion with normal breathing effort HEART: regular rate & rhythm and no murmurs and no lower extremity edema ABDOMEN:abdomen soft, non-tender and normal bowel sounds MUSCULOSKELETAL:no cyanosis of digits and no clubbing  NEURO: alert & oriented x 3 with fluent speech, no focal motor/sensory deficits EXTREMITIES: No lower extremity edema  LABORATORY DATA:  I have reviewed the data as listed CMP Latest Ref Rng & Units 01/15/2017 11/13/2016 09/11/2016  Glucose 70 - 140 mg/dl 93 89 85  BUN 7.0 - 26.0 mg/dL 12.7 14.3 14.9  Creatinine 0.6 - 1.1 mg/dL 0.8 0.8 0.8  Sodium 136 - 145 mEq/L 140 138 140  Potassium 3.5 - 5.1 mEq/L 3.9 4.3 4.3  Chloride 101 - 111 mmol/L - - -  CO2 22 - 29 mEq/L 20(L) 23 22  Calcium 8.4 - 10.4 mg/dL 9.3 9.6 9.6  Total Protein 6.4 - 8.3 g/dL 6.7 6.7 6.8  Total Bilirubin 0.20 - 1.20 mg/dL 0.46 0.48 0.38  Alkaline Phos 40 - 150 U/L 64 65 59  AST 5 - 34 U/L '20 18 15  ' ALT 0 - 55 U/L '20 14 14    ' Lab Results  Component Value Date   WBC 4.4 01/15/2017   HGB 13.5 01/15/2017   HCT 40.9 01/15/2017   MCV 95.1 01/15/2017   PLT 232 01/15/2017   NEUTROABS 3.3 01/15/2017    ASSESSMENT & PLAN:  Malignant neoplasm of upper-outer quadrant of right breast in female, estrogen receptor positive  ([place]) 08/09/2016 Right lumpectomy: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 adjuvant radiation started 10/31/16-11/28/16 3. antiestrogen therapy with anastrozole started 02/26/2017 ---------------------------------------------------------------------------- Current treatment: Herceptin  She will proceed with Herceptin today.  Herceptin toxicities: Patient reports that shortness of breath has been her major problem.  I suspect that this is related to her COPD.  She is seeing pulmonary and is planning to get PFTs and CT chest done next month. Echocardiogram done recently showed an EF of 60-65%.  Patient will start taking anastrozole starting today.  The mediastinum is social 26 RTC in 6 weeks for follow up    I spent 25 minutes talking to the patient of which more than half was spent in  counseling and coordination of care.  No orders of the defined types were placed in this encounter.  The patient has a good understanding of the overall plan. she agrees with it. she will call with any problems that may develop before the next visit here.   Harriette Ohara, MD 02/26/17

## 2017-03-01 ENCOUNTER — Ambulatory Visit (INDEPENDENT_AMBULATORY_CARE_PROVIDER_SITE_OTHER)
Admission: RE | Admit: 2017-03-01 | Discharge: 2017-03-01 | Disposition: A | Discharge status: Home / Self Care | Payer: PPO | Source: Ambulatory Visit | Attending: Emergency Medicine | Admitting: Emergency Medicine

## 2017-03-01 DIAGNOSIS — J984 Other disorders of lung: Secondary | ICD-10-CM

## 2017-03-01 DIAGNOSIS — J439 Emphysema, unspecified: Secondary | ICD-10-CM | POA: Diagnosis not present

## 2017-03-15 DIAGNOSIS — Z7901 Long term (current) use of anticoagulants: Secondary | ICD-10-CM | POA: Diagnosis not present

## 2017-03-15 DIAGNOSIS — Z853 Personal history of malignant neoplasm of breast: Secondary | ICD-10-CM | POA: Diagnosis not present

## 2017-03-15 DIAGNOSIS — Z170 Estrogen receptor positive status [ER+]: Secondary | ICD-10-CM | POA: Diagnosis not present

## 2017-03-15 DIAGNOSIS — C50911 Malignant neoplasm of unspecified site of right female breast: Secondary | ICD-10-CM | POA: Diagnosis not present

## 2017-03-19 ENCOUNTER — Other Ambulatory Visit: Payer: Self-pay

## 2017-03-19 ENCOUNTER — Inpatient Hospital Stay: Payer: PPO

## 2017-03-19 ENCOUNTER — Ambulatory Visit: Payer: PPO

## 2017-03-19 ENCOUNTER — Ambulatory Visit: Payer: PPO | Admitting: Hematology and Oncology

## 2017-03-19 ENCOUNTER — Other Ambulatory Visit: Payer: PPO

## 2017-03-19 VITALS — BP 151/91 | HR 103 | Temp 98.1°F | Resp 18

## 2017-03-19 DIAGNOSIS — Z5181 Encounter for therapeutic drug level monitoring: Secondary | ICD-10-CM

## 2017-03-19 DIAGNOSIS — Z5112 Encounter for antineoplastic immunotherapy: Secondary | ICD-10-CM | POA: Diagnosis not present

## 2017-03-19 DIAGNOSIS — Z79899 Other long term (current) drug therapy: Secondary | ICD-10-CM

## 2017-03-19 DIAGNOSIS — C50411 Malignant neoplasm of upper-outer quadrant of right female breast: Secondary | ICD-10-CM

## 2017-03-19 DIAGNOSIS — Z170 Estrogen receptor positive status [ER+]: Principal | ICD-10-CM

## 2017-03-19 MED ORDER — DIPHENHYDRAMINE HCL 25 MG PO CAPS
25.0000 mg | ORAL_CAPSULE | Freq: Once | ORAL | Status: AC
  Administered 2017-03-19: 25 mg via ORAL

## 2017-03-19 MED ORDER — ACETAMINOPHEN 325 MG PO TABS
ORAL_TABLET | ORAL | Status: AC
  Filled 2017-03-19: qty 2

## 2017-03-19 MED ORDER — ACETAMINOPHEN 325 MG PO TABS
650.0000 mg | ORAL_TABLET | Freq: Once | ORAL | Status: AC
  Administered 2017-03-19: 650 mg via ORAL

## 2017-03-19 MED ORDER — TRASTUZUMAB CHEMO 150 MG IV SOLR
6.0000 mg/kg | Freq: Once | INTRAVENOUS | Status: AC
  Administered 2017-03-19: 357 mg via INTRAVENOUS
  Filled 2017-03-19: qty 17

## 2017-03-19 MED ORDER — DIPHENHYDRAMINE HCL 25 MG PO CAPS
ORAL_CAPSULE | ORAL | Status: AC
  Filled 2017-03-19: qty 1

## 2017-03-19 MED ORDER — SODIUM CHLORIDE 0.9 % IV SOLN
Freq: Once | INTRAVENOUS | Status: AC
  Administered 2017-03-19: 15:00:00 via INTRAVENOUS

## 2017-03-19 NOTE — Patient Instructions (Signed)
[facility] Discharge Instructions for Patients Receiving Chemotherapy  Today you received the following chemotherapy agents Herceptin  To help prevent nausea and vomiting after your treatment, we encourage you to take your nausea medication as directed   If you develop nausea and vomiting that is not controlled by your nausea medication, call the clinic.   BELOW ARE SYMPTOMS THAT SHOULD BE REPORTED IMMEDIATELY:  *FEVER GREATER THAN 100.5 F  *CHILLS WITH OR WITHOUT FEVER  NAUSEA AND VOMITING THAT IS NOT CONTROLLED WITH YOUR NAUSEA MEDICATION  *UNUSUAL SHORTNESS OF BREATH  *UNUSUAL BRUISING OR BLEEDING  TENDERNESS IN MOUTH AND THROAT WITH OR WITHOUT PRESENCE OF ULCERS  *URINARY PROBLEMS  *BOWEL PROBLEMS  UNUSUAL RASH Items with * indicate a potential emergency and should be followed up as soon as possible.  Feel free to call the clinic should you have any questions or concerns. The clinic phone number is [phone].  Please show the CHEMO ALERT CARD at check-in to the Emergency Department and triage nurse.   

## 2017-03-20 ENCOUNTER — Telehealth: Payer: Self-pay | Admitting: Hematology and Oncology

## 2017-03-20 NOTE — Telephone Encounter (Signed)
Scheduled appt per 1/29 sch messag e- Pt is aware of ECHO scheduled by Butch Penny in [facility].

## 2017-03-25 ENCOUNTER — Ambulatory Visit (HOSPITAL_COMMUNITY)
Admission: RE | Admit: 2017-03-25 | Discharge: 2017-03-25 | Disposition: A | Discharge status: Home / Self Care | Payer: PPO | Source: Ambulatory Visit | Attending: Hematology and Oncology | Admitting: Hematology and Oncology

## 2017-03-25 DIAGNOSIS — Z79899 Other long term (current) drug therapy: Secondary | ICD-10-CM | POA: Diagnosis not present

## 2017-03-25 DIAGNOSIS — Z170 Estrogen receptor positive status [ER+]: Secondary | ICD-10-CM

## 2017-03-25 DIAGNOSIS — I080 Rheumatic disorders of both mitral and aortic valves: Secondary | ICD-10-CM | POA: Insufficient documentation

## 2017-03-25 DIAGNOSIS — Z5181 Encounter for therapeutic drug level monitoring: Secondary | ICD-10-CM | POA: Insufficient documentation

## 2017-03-25 DIAGNOSIS — C50411 Malignant neoplasm of upper-outer quadrant of right female breast: Secondary | ICD-10-CM | POA: Insufficient documentation

## 2017-03-25 DIAGNOSIS — G459 Transient cerebral ischemic attack, unspecified: Secondary | ICD-10-CM | POA: Insufficient documentation

## 2017-03-25 DIAGNOSIS — J449 Chronic obstructive pulmonary disease, unspecified: Secondary | ICD-10-CM | POA: Diagnosis not present

## 2017-03-25 DIAGNOSIS — Z853 Personal history of malignant neoplasm of breast: Secondary | ICD-10-CM | POA: Insufficient documentation

## 2017-03-25 DIAGNOSIS — R0600 Dyspnea, unspecified: Secondary | ICD-10-CM | POA: Insufficient documentation

## 2017-03-25 NOTE — Progress Notes (Signed)
  Echocardiogram 2D Echocardiogram has been performed.  Anna York Anna York 03/25/2017, 1:37 PM

## 2017-03-26 ENCOUNTER — Ambulatory Visit: Payer: PPO | Admitting: Emergency Medicine

## 2017-03-26 ENCOUNTER — Encounter: Payer: Self-pay | Admitting: Emergency Medicine

## 2017-03-26 ENCOUNTER — Ambulatory Visit (INDEPENDENT_AMBULATORY_CARE_PROVIDER_SITE_OTHER): Payer: PPO | Admitting: Emergency Medicine

## 2017-03-26 DIAGNOSIS — R0609 Other forms of dyspnea: Secondary | ICD-10-CM

## 2017-03-26 DIAGNOSIS — J449 Chronic obstructive pulmonary disease, unspecified: Secondary | ICD-10-CM

## 2017-03-26 LAB — PULMONARY FUNCTION TEST
DL/VA % pred: 50 %
DL/VA: 2.45 ml/min/mmHg/L
DLCO unc % pred: 41 %
DLCO unc: 10.13 ml/min/mmHg
FEF 25-75 Post: 0.63 L/sec
FEF 25-75 Pre: 0.63 L/sec
FEF2575-%Change-Post: 0 %
FEF2575-%Pred-Post: 38 %
FEF2575-%Pred-Pre: 38 %
FEV1-%Change-Post: 0 %
FEV1-%Pred-Post: 67 %
FEV1-%Pred-Pre: 67 %
FEV1-Post: 1.41 L
FEV1-Pre: 1.42 L
FEV1FVC-%Change-Post: 0 %
FEV1FVC-%Pred-Pre: 71 %
FEV6-%Change-Post: 0 %
FEV6-%Pred-Post: 94 %
FEV6-%Pred-Pre: 94 %
FEV6-Post: 2.54 L
FEV6-Pre: 2.54 L
FEV6FVC-%Change-Post: 0 %
FEV6FVC-%Pred-Post: 101 %
FEV6FVC-%Pred-Pre: 101 %
FVC-%Change-Post: 0 %
FVC-%Pred-Post: 93 %
FVC-%Pred-Pre: 93 %
FVC-Post: 2.63 L
FVC-Pre: 2.64 L
Post FEV1/FVC ratio: 54 %
Post FEV6/FVC ratio: 97 %
Pre FEV1/FVC ratio: 54 %
Pre FEV6/FVC Ratio: 96 %
RV % pred: 146 %
RV: 3.39 L
TLC % pred: 120 %
TLC: 6.11 L

## 2017-03-26 NOTE — Assessment & Plan Note (Signed)
Fairly significant hyperinflation and emphysema by CT scan.  I suspect that this is progressively worsening, possibly contributing to some hypoxemia.  She does not flare frequently, we could consider a re-try of Stiolto.   Please continue Spiriva and Symbicort as you have been taking them

## 2017-03-26 NOTE — Patient Instructions (Signed)
Your CT scan of the chest does not show any injuries that would be consistent with inflammation from Herceptin.  It does show areas of emphysema and also some mild right lung scarring from your previous radiation therapy. Your pulmonary function testing shows stable airflows and lung volumes compared with 2013.  There may be a decrease in your gas diffusion capacity that would suggest progressive emphysema. Please continue Spiriva and Symbicort as you have been taking them We will perform a walking oximetry on room air today Follow with Dr Lamonte Sakai in 6 months or sooner if you have any problems

## 2017-03-26 NOTE — Progress Notes (Signed)
Subjective:    Patient ID: Anna York, female    DOB: 13-Apr-1940, 77 y.o.   MRN: [id number] HPI 77 yo woman, former smoker (20 pk-yrs), hx breast CA, cerebrovascular disease, ? ASD (hasn't had TEE, TTE on 09/04/11 did not see it, PASP estimated 30-66mHg), HTN. She has been told that she had asthma in the past - seemed to be related to pet hair exposure, paint fumes. On these (rare) occasions, she responded well to albuterol. More recently she has been under eval by Anna York for exertional SOB. She recalls that her exertional tolerance has always been less than her peers, but now she has noticed that she has trouble with stairs, a brisk walk, especially if she is also talking. She reports occasional wheezing. Her CT scan chest 09/11/11 report is available - shows no PE, emphysematous changes. For this reason, a1-AT testing was done, not available yet.  She has stopped exercising about 2 yrs ago, has gained about 7 lbs.   ROV 10/18/11 -- f/u for chronic and episodic dyspnea, ? COPD with emphysematous change seen on imaging. Returns today after PFT >> moderately severe AFL, no BD response, hyperinflated RV, decreased DLCO that corrects for Va. She tried the albuterol and didn't notice very much. Her a-AT testing was normal.    PULMONARY FUNCTON TEST 10/18/2011  FVC 2.53  FEV1 1.03  FEV1/FVC 40.7  FVC  % Predicted 86  FEV % Predicted 49  FeF 25-75 .34  FeF 25-75 % Predicted 2.35    ROV 11/16/15 -- Is a follow-up visit for obstructive lung disease as indicated by pulmonary function testing and emphysematous change on chest CT. Also notes that she has a history of a palatine lesion on exam. She states that her breathing is stable, about the same as before. Has been managed on symbicort and spiriva. Uses albuterol occasionally.  No exacerbations since last time. She notes that the cost of Spiriva and Symbicort has been difficult  ROV 05/16/16 -- Anna York follows up today for her history of  COPD, emphysema. At our last visit in September we tried changing her bronchodilators to [facility]. She did not feel that the Stiolto was as beneficial and so she is now back on Symbicort plus Spiriva. She had a URI since our last visit, was treated with pred + abx in January by Anna York. In aftermath she had some persistent cough. Now improved. She is close to her baseline currently - she has had some need for her albuterol over thelast 2 weeks.   ROV 11/12/16 -- patient has a history of COPD, breast cancer. She is getting XRT thru mid October, is on herceptin. She feels dyspnea and fatigue after she takes the chemo. She remains on spiriva and symbicort. No chest tightness, wheeze. No cough, although she did have a URI a few weeks ago, now improved.   ROV 02/25/17 --this is a follow-up visit for patient with a history of emphysematous COPD.  She also has breast cancer, received radiation therapy and Herceptin. She is completing a year of herceptin. Currently managed on Spiriva and Symbicort. She had a CXR at last visit > stable scarring present. She feels that her breathing has been worse on herceptin. Worse today than last visit. No wheeze. Uses albuterol occasionally,   ROV 03/26/17 --patient follows up today for dyspnea and a history of emphysematous COPD.  She has a history of breast cancer that was treated with radiation therapy and Herceptin.  Based on her increased  dyspnea we repeated her pulmonary function testing and performed a CT scan of the chest given the potential risks for pneumonitis in the setting of Herceptin.  The CT scan was done on 03/02/17 and I have reviewed.  There are some emphysematous changes, some postradiation changes in the anterior right lung but there is no evidence of a diffuse pneumonitis or any effects from Herceptin.  Coronary function testing done today was reviewed.  This shows early severe obstruction with an FEV1 of 67% predicted.  No bronchodilator response, hyperinflated  lung volumes and a decreased diffusion capacity that does not correct when adjusted for alveolar volume.  She is on Spiriva and Symbicort. Unsure whether she is benefiting.       Objective:   Physical Exam Vitals:   03/26/17 1406  BP: 128/70  Pulse: (!) 110  SpO2: 94%  Weight: 130 lb (59 kg)  Height: 5' 4.25" (1.632 m)   Gen: Pleasant, well-nourished, in no distress,  normal affect  ENT: No lesions,    Neck: No JVD, no stridor  Lungs: No use of accessory muscles, no wheeze   Cardiovascular: RRR, heart sounds normal, no murmur or gallops, no peripheral edema  Musculoskeletal: No deformities, no cyanosis or clubbing  Neuro: alert, non focal  Skin: Warm, no lesions or rashes      Assessment & Plan:  Dyspnea on exertion Your CT scan of the chest does not show any injuries that would be consistent with inflammation from Herceptin.  It does show areas of emphysema and also some mild right lung scarring from your previous radiation therapy. Your pulmonary function testing shows stable airflows and lung volumes compared with 2013.  There may be a decrease in your gas diffusion capacity that would suggest progressive emphysema. We will perform a walking oximetry on room air today Follow with Anna Anna York in 6 months or sooner if you have any problems  COPD (chronic obstructive pulmonary disease) Fairly significant hyperinflation and emphysema by CT scan.  I suspect that this is progressively worsening, possibly contributing to some hypoxemia.  She does not flare frequently, we could consider a re-try of Stiolto.   Please continue Spiriva and Symbicort as you have been taking them  Anna Apo, MD, PhD 03/26/2017, 2:43 PM [facility] [phone] or if no answer [phone]

## 2017-03-26 NOTE — Assessment & Plan Note (Signed)
Your CT scan of the chest does not show any injuries that would be consistent with inflammation from Herceptin.  It does show areas of emphysema and also some mild right lung scarring from your previous radiation therapy. Your pulmonary function testing shows stable airflows and lung volumes compared with 2013.  There may be a decrease in your gas diffusion capacity that would suggest progressive emphysema. We will perform a walking oximetry on room air today Follow with Dr Lamonte Sakai in 6 months or sooner if you have any problems

## 2017-03-26 NOTE — Progress Notes (Signed)
PFT done today. 

## 2017-04-05 ENCOUNTER — Other Ambulatory Visit: Payer: Self-pay | Admitting: Emergency Medicine

## 2017-04-08 ENCOUNTER — Encounter: Payer: Self-pay | Admitting: *Deleted

## 2017-04-08 ENCOUNTER — Other Ambulatory Visit: Payer: Self-pay | Admitting: *Deleted

## 2017-04-08 DIAGNOSIS — Z853 Personal history of malignant neoplasm of breast: Secondary | ICD-10-CM

## 2017-04-09 ENCOUNTER — Telehealth: Payer: Self-pay | Admitting: Hematology and Oncology

## 2017-04-09 ENCOUNTER — Inpatient Hospital Stay (HOSPITAL_BASED_OUTPATIENT_CLINIC_OR_DEPARTMENT_OTHER): Payer: PPO | Admitting: Hematology and Oncology

## 2017-04-09 ENCOUNTER — Inpatient Hospital Stay: Payer: PPO

## 2017-04-09 ENCOUNTER — Inpatient Hospital Stay: Payer: PPO | Attending: Hematology and Oncology

## 2017-04-09 DIAGNOSIS — J449 Chronic obstructive pulmonary disease, unspecified: Secondary | ICD-10-CM

## 2017-04-09 DIAGNOSIS — C50411 Malignant neoplasm of upper-outer quadrant of right female breast: Secondary | ICD-10-CM | POA: Diagnosis not present

## 2017-04-09 DIAGNOSIS — Z79811 Long term (current) use of aromatase inhibitors: Secondary | ICD-10-CM | POA: Diagnosis not present

## 2017-04-09 DIAGNOSIS — Z923 Personal history of irradiation: Secondary | ICD-10-CM | POA: Diagnosis not present

## 2017-04-09 DIAGNOSIS — Z170 Estrogen receptor positive status [ER+]: Secondary | ICD-10-CM | POA: Diagnosis not present

## 2017-04-09 DIAGNOSIS — Z5112 Encounter for antineoplastic immunotherapy: Secondary | ICD-10-CM | POA: Insufficient documentation

## 2017-04-09 DIAGNOSIS — Z853 Personal history of malignant neoplasm of breast: Secondary | ICD-10-CM

## 2017-04-09 LAB — CBC WITH DIFFERENTIAL (CANCER CENTER ONLY)
BASOS PCT: 1 %
Basophils Absolute: 0 10*3/uL (ref 0.0–0.1)
Eosinophils Absolute: 0 10*3/uL (ref 0.0–0.5)
Eosinophils Relative: 1 %
HCT: 42.2 % (ref 34.8–46.6)
Hemoglobin: 13.9 g/dL (ref 11.6–15.9)
LYMPHS ABS: 0.7 10*3/uL — AB (ref 0.9–3.3)
LYMPHS PCT: 12 %
MCH: 32.1 pg (ref 25.1–34.0)
MCHC: 32.9 g/dL (ref 31.5–36.0)
MCV: 97.5 fL (ref 79.5–101.0)
MONOS PCT: 10 %
Monocytes Absolute: 0.7 10*3/uL (ref 0.1–0.9)
NEUTROS ABS: 4.8 10*3/uL (ref 1.5–6.5)
NEUTROS PCT: 76 %
Platelet Count: 242 10*3/uL (ref 145–400)
RBC: 4.33 MIL/uL (ref 3.70–5.45)
RDW: 13.6 % (ref 11.2–14.5)
WBC Count: 6.3 10*3/uL (ref 3.9–10.3)

## 2017-04-09 LAB — CMP (CANCER CENTER ONLY)
ALBUMIN: 4.3 g/dL (ref 3.5–5.0)
ALT: 14 U/L (ref 0–55)
AST: 18 U/L (ref 5–34)
Alkaline Phosphatase: 62 U/L (ref 40–150)
Anion gap: 11 (ref 3–11)
BUN: 14 mg/dL (ref 7–26)
CALCIUM: 9.6 mg/dL (ref 8.4–10.4)
CHLORIDE: 105 mmol/L (ref 98–109)
CO2: 22 mmol/L (ref 22–29)
Creatinine: 0.89 mg/dL (ref 0.60–1.10)
GFR, Estimated: >60 mL/min (ref >60)
Glucose, Bld: 97 mg/dL (ref 70–140)
POTASSIUM: 4.2 mmol/L (ref 3.5–5.1)
SODIUM: 138 mmol/L (ref 136–145)
Total Bilirubin: 0.5 mg/dL (ref 0.2–1.2)
Total Protein: 6.9 g/dL (ref 6.4–8.3)

## 2017-04-09 MED ORDER — SODIUM CHLORIDE 0.9 % IV SOLN
Freq: Once | INTRAVENOUS | Status: AC
  Administered 2017-04-09: 16:00:00 via INTRAVENOUS

## 2017-04-09 MED ORDER — DIPHENHYDRAMINE HCL 25 MG PO CAPS
ORAL_CAPSULE | ORAL | Status: AC
  Filled 2017-04-09: qty 1

## 2017-04-09 MED ORDER — DIPHENHYDRAMINE HCL 25 MG PO CAPS
25.0000 mg | ORAL_CAPSULE | Freq: Once | ORAL | Status: AC
  Administered 2017-04-09: 25 mg via ORAL

## 2017-04-09 MED ORDER — TRASTUZUMAB CHEMO 150 MG IV SOLR
6.0000 mg/kg | Freq: Once | INTRAVENOUS | Status: AC
  Administered 2017-04-09: 357 mg via INTRAVENOUS
  Filled 2017-04-09: qty 17

## 2017-04-09 MED ORDER — ACETAMINOPHEN 325 MG PO TABS
ORAL_TABLET | ORAL | Status: AC
  Filled 2017-04-09: qty 2

## 2017-04-09 MED ORDER — ACETAMINOPHEN 325 MG PO TABS
650.0000 mg | ORAL_TABLET | Freq: Once | ORAL | Status: AC
  Administered 2017-04-09: 650 mg via ORAL

## 2017-04-09 NOTE — Assessment & Plan Note (Signed)
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 adjuvant radiation started 10/31/16-11/28/16 3. antiestrogen therapy with anastrozole started 01/17/17 ---------------------------------------------------------------------------- Current treatment: Herceptin She will proceed with Herceptin today.   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  I discussed with her that her PFTs showed a decline in FEF 25/75 as well as DLCO.  She was informed that these changes are not related to Herceptin and are related to emphysema by Dr. Lamonte Sakai.  Anastrozole Toxicities: Denies any hot flashes or myalgias.  She is tolerating it extremely well.  RTC in 6 weeks for follow up

## 2017-04-09 NOTE — Patient Instructions (Signed)
[facility] Discharge Instructions for Patients Receiving Chemotherapy  Today you received the following chemotherapy agents Herceptin  To help prevent nausea and vomiting after your treatment, we encourage you to take your nausea medication as directed   If you develop nausea and vomiting that is not controlled by your nausea medication, call the clinic.   BELOW ARE SYMPTOMS THAT SHOULD BE REPORTED IMMEDIATELY:  *FEVER GREATER THAN 100.5 F  *CHILLS WITH OR WITHOUT FEVER  NAUSEA AND VOMITING THAT IS NOT CONTROLLED WITH YOUR NAUSEA MEDICATION  *UNUSUAL SHORTNESS OF BREATH  *UNUSUAL BRUISING OR BLEEDING  TENDERNESS IN MOUTH AND THROAT WITH OR WITHOUT PRESENCE OF ULCERS  *URINARY PROBLEMS  *BOWEL PROBLEMS  UNUSUAL RASH Items with * indicate a potential emergency and should be followed up as soon as possible.  Feel free to call the clinic should you have any questions or concerns. The clinic phone number is [phone].  Please show the CHEMO ALERT CARD at check-in to the Emergency Department and triage nurse.   

## 2017-04-09 NOTE — Progress Notes (Signed)
Patient Care Team: Crist Infante, MD as PCP - General (Internal Medicine) [name], MD as Consulting Physician (General Surgery) Magrinat, [name], MD as Consulting Physician (Oncology) Kyung Rudd, MD as Consulting Physician (Radiation Oncology)  DIAGNOSIS:  Encounter Diagnosis  Name Primary?  . Malignant neoplasm of upper-outer quadrant of right breast in female, estrogen receptor positive ([place])     SUMMARY OF ONCOLOGIC HISTORY:   Malignant neoplasm of upper-outer quadrant of right breast in female, estrogen receptor positive ([place])   1991 Initial Biopsy    Left Breast cancer treated with breast conserving surgery followed by chemotherapy and radiation, unknown receptor status and unknown chemotherapy regimen      07/18/2016 Initial Diagnosis    Right breast calcifications 4 mm size, biopsy revealed DCIS with calcifications low intermediate grade ER 100%, PR 50%, Tis Nx stage 0      08/09/2016 Surgery    Right lumpectomy: 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, with DCIS with calcifications, ER 100%, PR 95%, Ki-67 3%, HER-2 positive ratio 2.3, T1c Nx stage IA      09/11/2016 -  Chemotherapy    Herceptin every 3 weeks  1 year       10/31/2016 - 11/28/2016 Radiation Therapy    Adjuvant Radiation [facility]): Right breast / 42.56 Gy in 16 fractions  Boost: 10 Gy in 4 fractions        02/26/2017 -  Anti-estrogen oral therapy    Anastrozole 1 mg daily       CHIEF COMPLIANT: Follow-up on anastrozole and Herceptin  INTERVAL HISTORY: Anna York is a 77 year old with above-mentioned history of right breast cancer treated with lumpectomy and is completed radiation therapy.  She is currently on Herceptin with anastrozole.  She appears to be tolerating the treatment extremely well.  She had shortness of breath and she attributed it to Herceptin however after extensive workup and testing it was felt that it is not related to Herceptin.  Shortness of breath is related to  COPD.  REVIEW OF SYSTEMS:   Constitutional: Denies fevers, chills or abnormal weight loss Eyes: Denies blurriness of vision Ears, nose, mouth, throat, and face: Denies mucositis or sore throat Respiratory: Denies cough, dyspnea or wheezes Cardiovascular: Denies palpitation, chest discomfort Gastrointestinal:  Denies nausea, heartburn or change in bowel habits Skin: Denies abnormal skin rashes Lymphatics: Denies new lymphadenopathy or easy bruising Neurological:Denies numbness, tingling or new weaknesses Behavioral/Psych: Mood is stable, no new changes  Extremities: No lower extremity edema  All other systems were reviewed with the patient and are negative.  I have reviewed the past medical history, past surgical history, social history and family history with the patient and they are unchanged from previous note.  ALLERGIES:  is allergic to erythromycin and macrobid [nitrofurantoin].  MEDICATIONS:  Current Outpatient Medications  Medication Sig Dispense Refill  . albuterol (PROVENTIL HFA;VENTOLIN HFA) 108 (90 Base) MCG/ACT inhaler Inhale 2 puffs into the lungs every 6 (six) hours as needed. 1 Inhaler 3  . anastrozole (ARIMIDEX) 1 MG tablet Take 1 tablet (1 mg total) by mouth daily. 90 tablet 3  . Cholecalciferol (VITAMIN D) 2000 units CAPS Take 2,000 Units by mouth daily.    . clopidogrel (PLAVIX) 75 MG tablet Take 75 mg by mouth daily.      . Cyanocobalamin (B-12 PO) Take 1 tablet by mouth daily.    . metoprolol succinate (TOPROL-XL) 25 MG 24 hr tablet Take 12.5 mg by mouth daily.    Marland Kitchen nystatin (MYCOSTATIN) 100000 UNIT/ML  suspension     . omeprazole (PRILOSEC) 20 MG capsule Take 20 mg by mouth daily.    . pravastatin (PRAVACHOL) 20 MG tablet Take 20 mg by mouth 2 (two) times a week.     . SPIRIVA HANDIHALER 18 MCG inhalation capsule INHALE CONTENTS OF ONE CAPSULE ONCE A DAY 30 capsule 5  . SYMBICORT 160-4.5 MCG/ACT inhaler TWO PUFFS TWICE DAILY 10.2 g 5  . trastuzumab (HERCEPTIN)  150 MG SOLR injection Inject into the vein.     No current facility-administered medications for this visit.     PHYSICAL EXAMINATION: ECOG PERFORMANCE STATUS: 1 - Symptomatic but completely ambulatory  Vitals:   04/09/17 1444  BP: (!) 156/82  Pulse: 80  Resp: 18  Temp: 98.2 F (36.8 C)  SpO2: 94%   Filed Weights   04/09/17 1444  Weight: 131 lb 9.6 oz (59.7 kg)    GENERAL:alert, no distress and comfortable SKIN: skin color, texture, turgor are normal, no rashes or significant lesions EYES: normal, Conjunctiva are pink and non-injected, sclera clear OROPHARYNX:no exudate, no erythema and lips, buccal mucosa, and tongue normal  NECK: supple, thyroid normal size, non-tender, without nodularity LYMPH:  no palpable lymphadenopathy in the cervical, axillary or inguinal LUNGS: clear to auscultation and percussion with normal breathing effort HEART: regular rate & rhythm and no murmurs and no lower extremity edema ABDOMEN:abdomen soft, non-tender and normal bowel sounds MUSCULOSKELETAL:no cyanosis of digits and no clubbing  NEURO: alert & oriented x 3 with fluent speech, no focal motor/sensory deficits EXTREMITIES: No lower extremity edema  LABORATORY DATA:  I have reviewed the data as listed CMP Latest Ref Rng & Units 04/09/2017 01/15/2017 11/13/2016  Glucose 70 - 140 mg/dL 97 93 89  BUN 7 - 26 mg/dL 14 12.7 14.3  Creatinine 0.60 - 1.10 mg/dL 0.89 0.8 0.8  Sodium 136 - 145 mmol/L 138 140 138  Potassium 3.5 - 5.1 mmol/L 4.2 3.9 4.3  Chloride 98 - 109 mmol/L 105 - -  CO2 22 - 29 mmol/L 22 20(L) 23  Calcium 8.4 - 10.4 mg/dL 9.6 9.3 9.6  Total Protein 6.4 - 8.3 g/dL 6.9 6.7 6.7  Total Bilirubin 0.2 - 1.2 mg/dL 0.5 0.46 0.48  Alkaline Phos 40 - 150 U/L 62 64 65  AST 5 - 34 U/L _0 ALT 0 - 55 U/L _1 Lab Results  Component Value Date   WBC 6.3 04/09/2017   HGB 13.5 01/15/2017   HCT 42.2 04/09/2017   MCV 97.5 04/09/2017   PLT 242 04/09/2017   NEUTROABS 4.8  04/09/2017    ASSESSMENT & PLAN:  Malignant neoplasm of upper-outer quadrant of right breast in female, estrogen receptor positive ([place]) 08/09/2016 Right lumpectomy: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 adjuvant radiation started 10/31/16-11/28/16 3. antiestrogen therapy with anastrozole started 01/17/17 ---------------------------------------------------------------------------- Current treatment: Herceptin She will proceed with Herceptin today.   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  I discussed with her that her PFTs showed a decline in FEF 25/75 as well as DLCO.  She was informed that these changes are not related to Herceptin and are related to emphysema by Dr. Lamonte Sakai.  Anastrozole Toxicities: Denies any hot flashes or myalgias.  She is tolerating it extremely well.  RTC in 6 weeks for follow up     I spent 25 minutes talking to the patient of which more than half was spent in counseling and coordination of care.  No orders of the defined types were placed in this encounter.  The patient has a good understanding of the overall plan. she agrees with it. she will call with any problems that may develop before the next visit here.   Harriette Ohara, MD 04/09/17

## 2017-04-09 NOTE — Telephone Encounter (Signed)
Gave avs and calendar ° °

## 2017-04-12 DIAGNOSIS — H26492 Other secondary cataract, left eye: Secondary | ICD-10-CM | POA: Diagnosis not present

## 2017-04-12 DIAGNOSIS — H353131 Nonexudative age-related macular degeneration, bilateral, early dry stage: Secondary | ICD-10-CM | POA: Diagnosis not present

## 2017-04-30 ENCOUNTER — Other Ambulatory Visit: Payer: PPO

## 2017-04-30 ENCOUNTER — Inpatient Hospital Stay: Payer: PPO | Attending: Hematology and Oncology

## 2017-04-30 VITALS — BP 109/59 | HR 92 | Temp 97.7°F | Resp 16

## 2017-04-30 DIAGNOSIS — Z5112 Encounter for antineoplastic immunotherapy: Secondary | ICD-10-CM | POA: Insufficient documentation

## 2017-04-30 DIAGNOSIS — C50411 Malignant neoplasm of upper-outer quadrant of right female breast: Secondary | ICD-10-CM | POA: Diagnosis not present

## 2017-04-30 DIAGNOSIS — Z170 Estrogen receptor positive status [ER+]: Secondary | ICD-10-CM

## 2017-04-30 MED ORDER — ACETAMINOPHEN 325 MG PO TABS
ORAL_TABLET | ORAL | Status: AC
  Filled 2017-04-30: qty 2

## 2017-04-30 MED ORDER — ACETAMINOPHEN 325 MG PO TABS
650.0000 mg | ORAL_TABLET | Freq: Once | ORAL | Status: AC
  Administered 2017-04-30: 650 mg via ORAL

## 2017-04-30 MED ORDER — DIPHENHYDRAMINE HCL 25 MG PO CAPS
ORAL_CAPSULE | ORAL | Status: AC
  Filled 2017-04-30: qty 2

## 2017-04-30 MED ORDER — DIPHENHYDRAMINE HCL 25 MG PO CAPS
25.0000 mg | ORAL_CAPSULE | Freq: Once | ORAL | Status: AC
  Administered 2017-04-30: 25 mg via ORAL

## 2017-04-30 MED ORDER — SODIUM CHLORIDE 0.9 % IV SOLN
Freq: Once | INTRAVENOUS | Status: AC
  Administered 2017-04-30: 16:00:00 via INTRAVENOUS

## 2017-04-30 MED ORDER — TRASTUZUMAB CHEMO 150 MG IV SOLR
6.0000 mg/kg | Freq: Once | INTRAVENOUS | Status: AC
  Administered 2017-04-30: 357 mg via INTRAVENOUS
  Filled 2017-04-30: qty 17

## 2017-04-30 NOTE — Patient Instructions (Signed)
[facility] Discharge Instructions for Patients Receiving Chemotherapy  Today you received the following chemotherapy agents Herceptin  To help prevent nausea and vomiting after your treatment, we encourage you to take your nausea medication as directed   If you develop nausea and vomiting that is not controlled by your nausea medication, call the clinic.   BELOW ARE SYMPTOMS THAT SHOULD BE REPORTED IMMEDIATELY:  *FEVER GREATER THAN 100.5 F  *CHILLS WITH OR WITHOUT FEVER  NAUSEA AND VOMITING THAT IS NOT CONTROLLED WITH YOUR NAUSEA MEDICATION  *UNUSUAL SHORTNESS OF BREATH  *UNUSUAL BRUISING OR BLEEDING  TENDERNESS IN MOUTH AND THROAT WITH OR WITHOUT PRESENCE OF ULCERS  *URINARY PROBLEMS  *BOWEL PROBLEMS  UNUSUAL RASH Items with * indicate a potential emergency and should be followed up as soon as possible.  Feel free to call the clinic should you have any questions or concerns. The clinic phone number is [phone].  Please show the CHEMO ALERT CARD at check-in to the Emergency Department and triage nurse.   

## 2017-05-03 DIAGNOSIS — D692 Other nonthrombocytopenic purpura: Secondary | ICD-10-CM | POA: Diagnosis not present

## 2017-05-06 DIAGNOSIS — M25532 Pain in left wrist: Secondary | ICD-10-CM | POA: Diagnosis not present

## 2017-05-06 DIAGNOSIS — M25531 Pain in right wrist: Secondary | ICD-10-CM | POA: Diagnosis not present

## 2017-05-06 DIAGNOSIS — R52 Pain, unspecified: Secondary | ICD-10-CM | POA: Diagnosis not present

## 2017-05-20 ENCOUNTER — Other Ambulatory Visit: Payer: Self-pay

## 2017-05-20 DIAGNOSIS — Z853 Personal history of malignant neoplasm of breast: Secondary | ICD-10-CM

## 2017-05-21 ENCOUNTER — Inpatient Hospital Stay (HOSPITAL_BASED_OUTPATIENT_CLINIC_OR_DEPARTMENT_OTHER): Payer: PPO | Admitting: Hematology and Oncology

## 2017-05-21 ENCOUNTER — Inpatient Hospital Stay: Payer: PPO | Attending: Hematology and Oncology

## 2017-05-21 ENCOUNTER — Telehealth: Payer: Self-pay

## 2017-05-21 ENCOUNTER — Inpatient Hospital Stay: Payer: PPO

## 2017-05-21 ENCOUNTER — Telehealth: Payer: Self-pay | Admitting: Hematology and Oncology

## 2017-05-21 DIAGNOSIS — Z853 Personal history of malignant neoplasm of breast: Secondary | ICD-10-CM

## 2017-05-21 DIAGNOSIS — Z170 Estrogen receptor positive status [ER+]: Secondary | ICD-10-CM | POA: Diagnosis not present

## 2017-05-21 DIAGNOSIS — Z5112 Encounter for antineoplastic immunotherapy: Secondary | ICD-10-CM | POA: Diagnosis not present

## 2017-05-21 DIAGNOSIS — Z923 Personal history of irradiation: Secondary | ICD-10-CM | POA: Diagnosis not present

## 2017-05-21 DIAGNOSIS — Z79899 Other long term (current) drug therapy: Secondary | ICD-10-CM | POA: Diagnosis not present

## 2017-05-21 DIAGNOSIS — C50411 Malignant neoplasm of upper-outer quadrant of right female breast: Secondary | ICD-10-CM

## 2017-05-21 DIAGNOSIS — Z79811 Long term (current) use of aromatase inhibitors: Secondary | ICD-10-CM

## 2017-05-21 DIAGNOSIS — J439 Emphysema, unspecified: Secondary | ICD-10-CM | POA: Insufficient documentation

## 2017-05-21 LAB — CBC WITH DIFFERENTIAL (CANCER CENTER ONLY)
BASOS ABS: 0.1 10*3/uL (ref 0.0–0.1)
Basophils Relative: 1 %
Eosinophils Absolute: 0 10*3/uL (ref 0.0–0.5)
Eosinophils Relative: 1 %
HEMATOCRIT: 40.9 % (ref 34.8–46.6)
Hemoglobin: 13.4 g/dL (ref 11.6–15.9)
LYMPHS PCT: 12 %
Lymphs Abs: 0.6 10*3/uL — ABNORMAL LOW (ref 0.9–3.3)
MCH: 31.6 pg (ref 25.1–34.0)
MCHC: 32.8 g/dL (ref 31.5–36.0)
MCV: 96.4 fL (ref 79.5–101.0)
MONO ABS: 0.5 10*3/uL (ref 0.1–0.9)
Monocytes Relative: 10 %
NEUTROS ABS: 3.5 10*3/uL (ref 1.5–6.5)
Neutrophils Relative %: 76 %
Platelet Count: 244 10*3/uL (ref 145–400)
RBC: 4.24 MIL/uL (ref 3.70–5.45)
RDW: 13.8 % (ref 11.2–14.5)
WBC Count: 4.7 10*3/uL (ref 3.9–10.3)

## 2017-05-21 LAB — CMP (CANCER CENTER ONLY)
ALBUMIN: 4.1 g/dL (ref 3.5–5.0)
ALT: 18 U/L (ref 0–55)
ANION GAP: 10 (ref 3–11)
AST: 20 U/L (ref 5–34)
Alkaline Phosphatase: 72 U/L (ref 40–150)
BILIRUBIN TOTAL: 0.5 mg/dL (ref 0.2–1.2)
BUN: 15 mg/dL (ref 7–26)
CO2: 22 mmol/L (ref 22–29)
Calcium: 9.6 mg/dL (ref 8.4–10.4)
Chloride: 106 mmol/L (ref 98–109)
Creatinine: 0.83 mg/dL (ref 0.60–1.10)
GFR, Est AFR Am: >60 mL/min (ref >60)
GFR, Estimated: >60 mL/min (ref >60)
Glucose, Bld: 100 mg/dL (ref 70–140)
POTASSIUM: 4 mmol/L (ref 3.5–5.1)
SODIUM: 138 mmol/L (ref 136–145)
TOTAL PROTEIN: 6.7 g/dL (ref 6.4–8.3)

## 2017-05-21 MED ORDER — ACETAMINOPHEN 325 MG PO TABS
ORAL_TABLET | ORAL | Status: AC
  Filled 2017-05-21: qty 2

## 2017-05-21 MED ORDER — ACETAMINOPHEN 325 MG PO TABS
650.0000 mg | ORAL_TABLET | Freq: Once | ORAL | Status: AC
  Administered 2017-05-21: 650 mg via ORAL

## 2017-05-21 MED ORDER — TRASTUZUMAB CHEMO 150 MG IV SOLR
6.0000 mg/kg | Freq: Once | INTRAVENOUS | Status: AC
  Administered 2017-05-21: 357 mg via INTRAVENOUS
  Filled 2017-05-21: qty 17

## 2017-05-21 MED ORDER — DIPHENHYDRAMINE HCL 25 MG PO CAPS
25.0000 mg | ORAL_CAPSULE | Freq: Once | ORAL | Status: AC
  Administered 2017-05-21: 25 mg via ORAL

## 2017-05-21 MED ORDER — SODIUM CHLORIDE 0.9 % IV SOLN
Freq: Once | INTRAVENOUS | Status: AC
Start: 2017-05-21 — End: 2017-05-21
  Administered 2017-05-21: 16:00:00 via INTRAVENOUS

## 2017-05-21 MED ORDER — DIPHENHYDRAMINE HCL 25 MG PO CAPS
ORAL_CAPSULE | ORAL | Status: AC
  Filled 2017-05-21: qty 1

## 2017-05-21 MED ORDER — SODIUM CHLORIDE 0.9% FLUSH
10.0000 mL | INTRAVENOUS | Status: DC | PRN
  Filled 2017-05-21: qty 10

## 2017-05-21 MED ORDER — HEPARIN SOD (PORK) LOCK FLUSH 100 UNIT/ML IV SOLN
500.0000 [IU] | Freq: Once | INTRAVENOUS | Status: DC | PRN
  Filled 2017-05-21: qty 5

## 2017-05-21 NOTE — Telephone Encounter (Signed)
Gave avs and calendar ° °

## 2017-05-21 NOTE — Assessment & Plan Note (Signed)
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 adjuvant radiation started 10/31/16-11/28/16 3. 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  Follows Dr. Lamonte Sakai.  Anastrozole Toxicities: Denies any hot flashes or myalgias.  She is tolerating it extremely well.  RTC in 6 weeks for follow up

## 2017-05-21 NOTE — Progress Notes (Signed)
Patient Care Team: Crist Infante, MD as PCP - General (Internal Medicine) [name], MD as Consulting Physician (General Surgery) Magrinat, [name], MD as Consulting Physician (Oncology) Kyung Rudd, MD as Consulting Physician (Radiation Oncology)  DIAGNOSIS:  Encounter Diagnosis  Name Primary?  . Malignant neoplasm of upper-outer quadrant of right breast in female, estrogen receptor positive ([place])     SUMMARY OF ONCOLOGIC HISTORY:   Malignant neoplasm of upper-outer quadrant of right breast in female, estrogen receptor positive ([place])   1991 Initial Biopsy    Left Breast cancer treated with breast conserving surgery followed by chemotherapy and radiation, unknown receptor status and unknown chemotherapy regimen      07/18/2016 Initial Diagnosis    Right breast calcifications 4 mm size, biopsy revealed DCIS with calcifications low intermediate grade ER 100%, PR 50%, Tis Nx stage 0      08/09/2016 Surgery    Right lumpectomy: 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, with DCIS with calcifications, ER 100%, PR 95%, Ki-67 3%, HER-2 positive ratio 2.3, T1c Nx stage IA      09/11/2016 -  Chemotherapy    Herceptin every 3 weeks  1 year       10/31/2016 - 11/28/2016 Radiation Therapy    Adjuvant Radiation [facility]): Right breast / 42.56 Gy in 16 fractions  Boost: 10 Gy in 4 fractions        02/26/2017 -  Anti-estrogen oral therapy    Anastrozole 1 mg daily       CHIEF COMPLIANT: Follow-up on Herceptin with anastrozole  INTERVAL HISTORY: Anna York is a 77 year old with above-mentioned history of right breast cancer treated with lumpectomy and is currently in adjuvant therapy with Herceptin.  She will finish Herceptin by July 2019.  She also underwent radiation therapy as well as currently taking anastrozole therapy.  She appears to be tolerating anastrozole fairly well.  She denies any lumps or nodules in the breast.  REVIEW OF SYSTEMS:   Constitutional: Denies fevers,  chills or abnormal weight loss Eyes: Denies blurriness of vision Ears, nose, mouth, throat, and face: Denies mucositis or sore throat Respiratory: Denies cough, dyspnea or wheezes Cardiovascular: Denies palpitation, chest discomfort Gastrointestinal:  Denies nausea, heartburn or change in bowel habits Skin: Denies abnormal skin rashes Lymphatics: Denies new lymphadenopathy or easy bruising Neurological:Denies numbness, tingling or new weaknesses Behavioral/Psych: Mood is stable, no new changes  Extremities: No lower extremity edema Breast:  denies any pain or lumps or nodules in either breasts All other systems were reviewed with the patient and are negative.  I have reviewed the past medical history, past surgical history, social history and family history with the patient and they are unchanged from previous note.  ALLERGIES:  is allergic to erythromycin and macrobid [nitrofurantoin].  MEDICATIONS:  Current Outpatient Medications  Medication Sig Dispense Refill  . albuterol (PROVENTIL HFA;VENTOLIN HFA) 108 (90 Base) MCG/ACT inhaler Inhale 2 puffs into the lungs every 6 (six) hours as needed. 1 Inhaler 3  . anastrozole (ARIMIDEX) 1 MG tablet Take 1 tablet (1 mg total) by mouth daily. 90 tablet 3  . Cholecalciferol (VITAMIN D) 2000 units CAPS Take 2,000 Units by mouth daily.    . clopidogrel (PLAVIX) 75 MG tablet Take 75 mg by mouth daily.      . Cyanocobalamin (B-12 PO) Take 1 tablet by mouth daily.    . metoprolol succinate (TOPROL-XL) 25 MG 24 hr tablet Take 12.5 mg by mouth daily.    Marland Kitchen nystatin (MYCOSTATIN) 100000 UNIT/ML  suspension     . omeprazole (PRILOSEC) 20 MG capsule Take 20 mg by mouth daily.    . pravastatin (PRAVACHOL) 20 MG tablet Take 20 mg by mouth 2 (two) times a week.     . SPIRIVA HANDIHALER 18 MCG inhalation capsule INHALE CONTENTS OF ONE CAPSULE ONCE A DAY 30 capsule 5  . SYMBICORT 160-4.5 MCG/ACT inhaler TWO PUFFS TWICE DAILY 10.2 g 5  . trastuzumab (HERCEPTIN)  150 MG SOLR injection Inject into the vein.     No current facility-administered medications for this visit.     PHYSICAL EXAMINATION: ECOG PERFORMANCE STATUS: 1 - Symptomatic but completely ambulatory  Vitals:   05/21/17 1434  BP: (!) 169/82  Pulse: 82  Resp: 16  Temp: 98.4 F (36.9 C)  SpO2: 96%   Filed Weights   05/21/17 1434  Weight: 130 lb 1.6 oz (59 kg)    GENERAL:alert, no distress and comfortable SKIN: skin color, texture, turgor are normal, no rashes or significant lesions EYES: normal, Conjunctiva are pink and non-injected, sclera clear OROPHARYNX:no exudate, no erythema and lips, buccal mucosa, and tongue normal  NECK: supple, thyroid normal size, non-tender, without nodularity LYMPH:  no palpable lymphadenopathy in the cervical, axillary or inguinal LUNGS: clear to auscultation and percussion with normal breathing effort HEART: regular rate & rhythm and no murmurs and no lower extremity edema ABDOMEN:abdomen soft, non-tender and normal bowel sounds MUSCULOSKELETAL:no cyanosis of digits and no clubbing  NEURO: alert & oriented x 3 with fluent speech, no focal motor/sensory deficits EXTREMITIES: No lower extremity edema  LABORATORY DATA:  I have reviewed the data as listed CMP Latest Ref Rng & Units 04/09/2017 01/15/2017 11/13/2016  Glucose 70 - 140 mg/dL 97 93 89  BUN 7 - 26 mg/dL 14 12.7 14.3  Creatinine 0.60 - 1.10 mg/dL 0.89 0.8 0.8  Sodium 136 - 145 mmol/L 138 140 138  Potassium 3.5 - 5.1 mmol/L 4.2 3.9 4.3  Chloride 98 - 109 mmol/L 105 - -  CO2 22 - 29 mmol/L 22 20(L) 23  Calcium 8.4 - 10.4 mg/dL 9.6 9.3 9.6  Total Protein 6.4 - 8.3 g/dL 6.9 6.7 6.7  Total Bilirubin 0.2 - 1.2 mg/dL 0.5 0.46 0.48  Alkaline Phos 40 - 150 U/L 62 64 65  AST 5 - 34 U/L '18 20 18  ' ALT 0 - 55 U/L '14 20 14    ' Lab Results  Component Value Date   WBC 4.7 05/21/2017   HGB 13.5 01/15/2017   HCT 40.9 05/21/2017   MCV 96.4 05/21/2017   PLT 244 05/21/2017   NEUTROABS 3.5  05/21/2017    ASSESSMENT & PLAN:  Malignant neoplasm of upper-outer quadrant of right breast in female, estrogen receptor positive ([place]) 08/09/2016 Right lumpectomy: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 adjuvant radiation started 10/31/16-11/28/16 3. 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  Follows Dr. Lamonte Sakai.  Anastrozole Toxicities: Denies any hot flashes or myalgias.  She is tolerating it extremely well.  RTC in 6 weeks for follow up.  Patient will finish adjuvant Herceptin treatment 09/10/2017.  No orders of the defined types were placed in this encounter.  The patient has a good understanding of the overall plan. she agrees  with it. she will call with any problems that may develop before the next visit here.   Harriette Ohara, MD 05/21/17

## 2017-05-21 NOTE — Telephone Encounter (Signed)
LVM reminding patient of SCP visit with [facility] on 05/28/17 at 2 pm.  Left number to center for call back with questions.

## 2017-05-23 ENCOUNTER — Encounter: Payer: Self-pay | Admitting: Gynecology

## 2017-05-23 ENCOUNTER — Ambulatory Visit: Payer: PPO | Admitting: Gynecology

## 2017-05-23 VITALS — BP 118/74 | Ht 65.0 in | Wt 130.0 lb

## 2017-05-23 DIAGNOSIS — N952 Postmenopausal atrophic vaginitis: Secondary | ICD-10-CM

## 2017-05-23 DIAGNOSIS — Z01411 Encounter for gynecological examination (general) (routine) with abnormal findings: Secondary | ICD-10-CM

## 2017-05-23 DIAGNOSIS — M8580 Other specified disorders of bone density and structure, unspecified site: Secondary | ICD-10-CM

## 2017-05-23 DIAGNOSIS — Z853 Personal history of malignant neoplasm of breast: Secondary | ICD-10-CM

## 2017-05-23 DIAGNOSIS — Z9189 Other specified personal risk factors, not elsewhere classified: Secondary | ICD-10-CM

## 2017-05-23 NOTE — Patient Instructions (Signed)
Follow-up in 1 year for annual exam 

## 2017-05-23 NOTE — Progress Notes (Signed)
    Anna York 12-08-1940 [id number]        76 y.o.  G1P1001 for breast and pelvic exam.  Doing well without gynecologic complaints.  Was diagnosed with a secondary breast primary in her right breast and undergoing treatment now to include Herceptin and Arimidex  Past medical history,surgical history, problem list, medications, allergies, family history and social history were all reviewed and documented as reviewed in the EPIC chart.  ROS:  Performed with pertinent positives and negatives included in the history, assessment and plan.   Additional significant findings : None   Exam: [name] assistant Vitals:   05/23/17 1521  BP: 118/74  Weight: 130 lb (59 kg)  Height: 5\' 5"  (1.651 m)   Body mass index is 21.63 kg/m.  General appearance:  Normal affect, orientation and appearance. Skin: Grossly normal HEENT: Without gross lesions.  No cervical or supraclavicular adenopathy. Thyroid normal.  Lungs:  Clear without wheezing, rales or rhonchi Cardiac: RR, without RMG Abdominal:  Soft, nontender, without masses, guarding, rebound, organomegaly or hernia Breasts:  Examined lying and sitting without masses, retractions, discharge or axillary adenopathy.  Well-healed bilateral lumpectomy scars Pelvic:  Ext, BUS, Vagina: With atrophic changes  Cervix: With atrophic changes  Uterus: Anteverted, normal size, shape and contour, midline and mobile nontender   Adnexa: Without masses or tenderness    Anus and perineum: Normal   Rectovaginal: Normal sphincter tone without palpated masses or tenderness.    Assessment/Plan:  77 y.o. G63P1001 female for breast and pelvic exam.   1. Postmenopausal/atrophic genital changes.  Without significant symptoms or any vaginal bleeding. 2. History of bilateral breast cancer.  Actively being followed by oncology.  Exam today NED.  Follow-up mammogram at their recommendation. 3. History of osteopenia being followed by Dr. Joylene Draft.  I discussed  with her her increased risk of bone loss with Arimidex and make sure she discusses with Dr. Joylene Draft when to repeat her bone density. 4. Pap smear 2018.  No Pap smear done today.  History of LGSIL 2007.  ASCUS with negative high risk HPV 2000 08/2009.  Negative Pap smears since. 5. Colonoscopy 2011.  Repeat at their recommended interval. 6. Health maintenance.  No routine lab work done as patient does this elsewhere.  Follow-up 1 year, sooner as needed.    [name] MD, 3:48 PM 05/23/2017

## 2017-05-28 ENCOUNTER — Encounter: Payer: PPO | Admitting: Adult Health

## 2017-05-30 DIAGNOSIS — H26492 Other secondary cataract, left eye: Secondary | ICD-10-CM | POA: Diagnosis not present

## 2017-06-05 DIAGNOSIS — R52 Pain, unspecified: Secondary | ICD-10-CM | POA: Diagnosis not present

## 2017-06-05 DIAGNOSIS — R229 Localized swelling, mass and lump, unspecified: Secondary | ICD-10-CM | POA: Diagnosis not present

## 2017-06-05 DIAGNOSIS — M25531 Pain in right wrist: Secondary | ICD-10-CM | POA: Diagnosis not present

## 2017-06-05 DIAGNOSIS — M25532 Pain in left wrist: Secondary | ICD-10-CM | POA: Diagnosis not present

## 2017-06-06 ENCOUNTER — Other Ambulatory Visit: Payer: Self-pay | Admitting: Gynecology

## 2017-06-06 ENCOUNTER — Telehealth: Payer: Self-pay | Admitting: *Deleted

## 2017-06-06 MED ORDER — LEVOFLOXACIN 500 MG PO TABS: 500.0000 mg | ORAL_TABLET | Freq: Every day | ORAL | 1 refills | Status: DC

## 2017-06-06 NOTE — Telephone Encounter (Signed)
Patient called stating her Levaquin 500 mg tablet 1po daily  was denied # 3 tablet 1 refill, pt said she takes for cystitis, patient said you prescribed before rarely uses. Please advise

## 2017-06-06 NOTE — Telephone Encounter (Signed)
Actually Dr. Cherylann Banas has prescribed this.  Okay to prescribe with 1 refill

## 2017-06-06 NOTE — Telephone Encounter (Signed)
Pt aware, Rx sent. 

## 2017-06-07 ENCOUNTER — Other Ambulatory Visit: Payer: Self-pay | Admitting: Orthopedic Surgery

## 2017-06-07 DIAGNOSIS — R2231 Localized swelling, mass and lump, right upper limb: Secondary | ICD-10-CM

## 2017-06-11 ENCOUNTER — Inpatient Hospital Stay: Payer: PPO

## 2017-06-11 VITALS — BP 135/66 | HR 87 | Temp 97.9°F | Resp 17

## 2017-06-11 DIAGNOSIS — Z5112 Encounter for antineoplastic immunotherapy: Secondary | ICD-10-CM | POA: Diagnosis not present

## 2017-06-11 DIAGNOSIS — Z170 Estrogen receptor positive status [ER+]: Principal | ICD-10-CM

## 2017-06-11 DIAGNOSIS — C50411 Malignant neoplasm of upper-outer quadrant of right female breast: Secondary | ICD-10-CM

## 2017-06-11 MED ORDER — DIPHENHYDRAMINE HCL 25 MG PO CAPS
ORAL_CAPSULE | ORAL | Status: AC
  Filled 2017-06-11: qty 1

## 2017-06-11 MED ORDER — ACETAMINOPHEN 325 MG PO TABS
650.0000 mg | ORAL_TABLET | Freq: Once | ORAL | Status: AC
  Administered 2017-06-11: 650 mg via ORAL

## 2017-06-11 MED ORDER — SODIUM CHLORIDE 0.9 % IV SOLN
Freq: Once | INTRAVENOUS | Status: AC
  Administered 2017-06-11: 15:00:00 via INTRAVENOUS

## 2017-06-11 MED ORDER — TRASTUZUMAB CHEMO 150 MG IV SOLR
6.0000 mg/kg | Freq: Once | INTRAVENOUS | Status: AC
  Administered 2017-06-11: 357 mg via INTRAVENOUS
  Filled 2017-06-11: qty 17

## 2017-06-11 MED ORDER — ACETAMINOPHEN 325 MG PO TABS
ORAL_TABLET | ORAL | Status: AC
  Filled 2017-06-11: qty 2

## 2017-06-11 MED ORDER — DIPHENHYDRAMINE HCL 25 MG PO CAPS
25.0000 mg | ORAL_CAPSULE | Freq: Once | ORAL | Status: AC
  Administered 2017-06-11: 25 mg via ORAL

## 2017-06-11 NOTE — Patient Instructions (Signed)
[facility] Discharge Instructions for Patients Receiving Chemotherapy  Today you received the following chemotherapy agents Herceptin  To help prevent nausea and vomiting after your treatment, we encourage you to take your nausea medication as directed   If you develop nausea and vomiting that is not controlled by your nausea medication, call the clinic.   BELOW ARE SYMPTOMS THAT SHOULD BE REPORTED IMMEDIATELY:  *FEVER GREATER THAN 100.5 F  *CHILLS WITH OR WITHOUT FEVER  NAUSEA AND VOMITING THAT IS NOT CONTROLLED WITH YOUR NAUSEA MEDICATION  *UNUSUAL SHORTNESS OF BREATH  *UNUSUAL BRUISING OR BLEEDING  TENDERNESS IN MOUTH AND THROAT WITH OR WITHOUT PRESENCE OF ULCERS  *URINARY PROBLEMS  *BOWEL PROBLEMS  UNUSUAL RASH Items with * indicate a potential emergency and should be followed up as soon as possible.  Feel free to call the clinic should you have any questions or concerns. The clinic phone number is [phone].  Please show the CHEMO ALERT CARD at check-in to the Emergency Department and triage nurse.   

## 2017-06-12 DIAGNOSIS — M19011 Primary osteoarthritis, right shoulder: Secondary | ICD-10-CM | POA: Diagnosis not present

## 2017-06-12 DIAGNOSIS — M25511 Pain in right shoulder: Secondary | ICD-10-CM | POA: Diagnosis not present

## 2017-06-17 ENCOUNTER — Other Ambulatory Visit: Payer: PPO

## 2017-06-19 ENCOUNTER — Other Ambulatory Visit: Payer: Self-pay | Admitting: Orthopedic Surgery

## 2017-06-19 ENCOUNTER — Ambulatory Visit
Admission: RE | Admit: 2017-06-19 | Discharge: 2017-06-19 | Disposition: A | Discharge status: Home / Self Care | Payer: PPO | Source: Ambulatory Visit | Attending: Orthopedic Surgery | Admitting: Orthopedic Surgery

## 2017-06-19 DIAGNOSIS — R2231 Localized swelling, mass and lump, right upper limb: Secondary | ICD-10-CM | POA: Diagnosis not present

## 2017-06-24 ENCOUNTER — Encounter (HOSPITAL_COMMUNITY): Payer: Self-pay | Admitting: Vascular Surgery

## 2017-07-01 ENCOUNTER — Other Ambulatory Visit: Payer: Self-pay

## 2017-07-01 DIAGNOSIS — Z853 Personal history of malignant neoplasm of breast: Secondary | ICD-10-CM

## 2017-07-02 ENCOUNTER — Inpatient Hospital Stay (HOSPITAL_BASED_OUTPATIENT_CLINIC_OR_DEPARTMENT_OTHER): Payer: PPO | Admitting: Hematology and Oncology

## 2017-07-02 ENCOUNTER — Inpatient Hospital Stay: Payer: PPO

## 2017-07-02 ENCOUNTER — Inpatient Hospital Stay: Payer: PPO | Attending: Hematology and Oncology

## 2017-07-02 DIAGNOSIS — Z5112 Encounter for antineoplastic immunotherapy: Secondary | ICD-10-CM | POA: Diagnosis not present

## 2017-07-02 DIAGNOSIS — Z79811 Long term (current) use of aromatase inhibitors: Secondary | ICD-10-CM | POA: Insufficient documentation

## 2017-07-02 DIAGNOSIS — Z79899 Other long term (current) drug therapy: Secondary | ICD-10-CM | POA: Diagnosis not present

## 2017-07-02 DIAGNOSIS — Z853 Personal history of malignant neoplasm of breast: Secondary | ICD-10-CM

## 2017-07-02 DIAGNOSIS — C50411 Malignant neoplasm of upper-outer quadrant of right female breast: Secondary | ICD-10-CM

## 2017-07-02 DIAGNOSIS — Z170 Estrogen receptor positive status [ER+]: Secondary | ICD-10-CM

## 2017-07-02 MED ORDER — DIPHENHYDRAMINE HCL 25 MG PO CAPS
25.0000 mg | ORAL_CAPSULE | Freq: Once | ORAL | Status: AC
  Administered 2017-07-02: 25 mg via ORAL

## 2017-07-02 MED ORDER — TRASTUZUMAB CHEMO 150 MG IV SOLR
6.0000 mg/kg | Freq: Once | INTRAVENOUS | Status: AC
  Administered 2017-07-02: 357 mg via INTRAVENOUS
  Filled 2017-07-02: qty 17

## 2017-07-02 MED ORDER — ACETAMINOPHEN 325 MG PO TABS
ORAL_TABLET | ORAL | Status: AC
  Filled 2017-07-02: qty 2

## 2017-07-02 MED ORDER — SODIUM CHLORIDE 0.9 % IV SOLN
Freq: Once | INTRAVENOUS | Status: AC
  Administered 2017-07-02: 15:00:00 via INTRAVENOUS

## 2017-07-02 MED ORDER — DIPHENHYDRAMINE HCL 25 MG PO CAPS
ORAL_CAPSULE | ORAL | Status: AC
  Filled 2017-07-02: qty 1

## 2017-07-02 MED ORDER — ACETAMINOPHEN 325 MG PO TABS
650.0000 mg | ORAL_TABLET | Freq: Once | ORAL | Status: AC
  Administered 2017-07-02: 650 mg via ORAL

## 2017-07-02 NOTE — Progress Notes (Signed)
Patient Care Team: Crist Infante, MD as PCP - General (Internal Medicine) [name], MD as Consulting Physician (General Surgery) Magrinat, [name], MD as Consulting Physician (Oncology) Kyung Rudd, MD as Consulting Physician (Radiation Oncology) Delice Bison Charlestine Massed, NP as Nurse Practitioner (Hematology and Oncology)  DIAGNOSIS:  Encounter Diagnosis  Name Primary?  . Malignant neoplasm of upper-outer quadrant of right breast in female, estrogen receptor positive ([place])     SUMMARY OF ONCOLOGIC HISTORY:   Malignant neoplasm of upper-outer quadrant of right breast in female, estrogen receptor positive ([place])   1991 Initial Biopsy    Left Breast cancer treated with breast conserving surgery followed by chemotherapy and radiation, unknown receptor status and unknown chemotherapy regimen      07/18/2016 Initial Diagnosis    Right breast calcifications 4 mm size, biopsy revealed DCIS with calcifications low intermediate grade ER 100%, PR 50%, Tis Nx stage 0      08/09/2016 Surgery    Right lumpectomy: 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, with DCIS with calcifications, ER 100%, PR 95%, Ki-67 3%, HER-2 positive ratio 2.3, T1c Nx stage IA      09/11/2016 -  Chemotherapy    Herceptin every 3 weeks  1 year       10/31/2016 - 11/28/2016 Radiation Therapy    Adjuvant Radiation [facility]): Right breast / 42.56 Gy in 16 fractions  Boost: 10 Gy in 4 fractions        02/26/2017 -  Anti-estrogen oral therapy    Anastrozole 1 mg daily       CHIEF COMPLIANT: Follow-up on Herceptin and anastrozole  INTERVAL HISTORY: Anna York is a 77 year old with above-mentioned history of right breast cancer treated with lumpectomy followed by adjuvant Herceptin.  She is receiving it every 3 weeks for 1 year.  She will complete her 1 year of treatment by 09/10/2017.  She appears to be tolerating it fairly well.  Does not have any side effects.  She is also tolerating anastrozole extremely well.   Denies any hot flashes or myalgias.  REVIEW OF SYSTEMS:   Constitutional: Denies fevers, chills or abnormal weight loss Eyes: Denies blurriness of vision Ears, nose, mouth, throat, and face: Denies mucositis or sore throat Respiratory: Denies cough, dyspnea or wheezes Cardiovascular: Denies palpitation, chest discomfort Gastrointestinal:  Denies nausea, heartburn or change in bowel habits Skin: Denies abnormal skin rashes Lymphatics: Denies new lymphadenopathy or easy bruising Neurological:Denies numbness, tingling or new weaknesses Behavioral/Psych: Mood is stable, no new changes  Extremities: No lower extremity edema Breast:  denies any pain or lumps or nodules in either breasts All other systems were reviewed with the patient and are negative.  I have reviewed the past medical history, past surgical history, social history and family history with the patient and they are unchanged from previous note.  ALLERGIES:  is allergic to erythromycin and macrobid [nitrofurantoin].  MEDICATIONS:  Current Outpatient Medications  Medication Sig Dispense Refill  . albuterol (PROVENTIL HFA;VENTOLIN HFA) 108 (90 Base) MCG/ACT inhaler Inhale 2 puffs into the lungs every 6 (six) hours as needed. 1 Inhaler 3  . anastrozole (ARIMIDEX) 1 MG tablet Take 1 tablet (1 mg total) by mouth daily. 90 tablet 3  . Cholecalciferol (VITAMIN D) 2000 units CAPS Take 2,000 Units by mouth daily.    . clopidogrel (PLAVIX) 75 MG tablet Take 75 mg by mouth daily.      . Cyanocobalamin (B-12 PO) Take 1 tablet by mouth daily.    Marland Kitchen levofloxacin (LEVAQUIN) 500  MG tablet Take 1 tablet (500 mg total) by mouth daily. 3 tablet 1  . metoprolol succinate (TOPROL-XL) 25 MG 24 hr tablet Take 12.5 mg by mouth daily.    Marland Kitchen nystatin (MYCOSTATIN) 100000 UNIT/ML suspension     . omeprazole (PRILOSEC) 20 MG capsule Take 20 mg by mouth daily.    . pravastatin (PRAVACHOL) 20 MG tablet Take 20 mg by mouth 2 (two) times a week.     . SPIRIVA  HANDIHALER 18 MCG inhalation capsule INHALE CONTENTS OF ONE CAPSULE ONCE A DAY 30 capsule 5  . SYMBICORT 160-4.5 MCG/ACT inhaler TWO PUFFS TWICE DAILY 10.2 g 5  . trastuzumab (HERCEPTIN) 150 MG SOLR injection Inject into the vein.     No current facility-administered medications for this visit.    Facility-Administered Medications Ordered in Other Visits  Medication Dose Route Frequency Provider Last Rate Last Dose  . trastuzumab (HERCEPTIN) 357 mg in sodium chloride 0.9 % 250 mL chemo infusion  6 mg/kg (Treatment Plan Recorded) Intravenous Once Nicholas Lose, MD 534 mL/hr at 07/02/17 1620 357 mg at 07/02/17 1620    PHYSICAL EXAMINATION: ECOG PERFORMANCE STATUS: 1 - Symptomatic but completely ambulatory  Vitals:   07/02/17 1451 07/02/17 1456  BP: (!) 187/85 (!) 179/92  Pulse: 95   Resp: 18   Temp: 98.4 F (36.9 C)   SpO2: 93%    Filed Weights   07/02/17 1451  Weight: 128 lb 13.9 oz (58.5 kg)    GENERAL:alert, no distress and comfortable SKIN: skin color, texture, turgor are normal, no rashes or significant lesions EYES: normal, Conjunctiva are pink and non-injected, sclera clear OROPHARYNX:no exudate, no erythema and lips, buccal mucosa, and tongue normal  NECK: supple, thyroid normal size, non-tender, without nodularity LYMPH:  no palpable lymphadenopathy in the cervical, axillary or inguinal LUNGS: clear to auscultation and percussion with normal breathing effort HEART: regular rate & rhythm and no murmurs and no lower extremity edema ABDOMEN:abdomen soft, non-tender and normal bowel sounds MUSCULOSKELETAL:no cyanosis of digits and no clubbing  NEURO: alert & oriented x 3 with fluent speech, no focal motor/sensory deficits EXTREMITIES: No lower extremity edema  LABORATORY DATA:  I have reviewed the data as listed CMP Latest Ref Rng & Units 05/21/2017 04/09/2017 01/15/2017  Glucose 70 - 140 mg/dL 100 97 93  BUN 7 - 26 mg/dL 15 14 12.7  Creatinine 0.60 - 1.10 mg/dL 0.83  0.89 0.8  Sodium 136 - 145 mmol/L 138 138 140  Potassium 3.5 - 5.1 mmol/L 4.0 4.2 3.9  Chloride 98 - 109 mmol/L 106 105 -  CO2 22 - 29 mmol/L 22 22 20(L)  Calcium 8.4 - 10.4 mg/dL 9.6 9.6 9.3  Total Protein 6.4 - 8.3 g/dL 6.7 6.9 6.7  Total Bilirubin 0.2 - 1.2 mg/dL 0.5 0.5 0.46  Alkaline Phos 40 - 150 U/L 72 62 64  AST 5 - 34 U/L '20 18 20  ' ALT 0 - 55 U/L '18 14 20    ' Lab Results  Component Value Date   WBC 4.7 05/21/2017   HGB 13.4 05/21/2017   HCT 40.9 05/21/2017   MCV 96.4 05/21/2017   PLT 244 05/21/2017   NEUTROABS 3.5 05/21/2017    ASSESSMENT & PLAN:  Malignant neoplasm of upper-outer quadrant of right breast in female, estrogen receptor positive ([place]) 08/09/2016 Right lumpectomy:Grade 1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, with DCIS with calcifications, ER 100%, PR 95%, Ki-67 3%, HER-2 positive ratio 2.3, T1c Nx stage IA (1993 breast cancer diagnosis treated with  breast conserving surgery with radiation)  Treatment plan:  1.1 year of Herceptin alone started 09/11/16 2. adjuvant radiation 10/31/16-11/28/16 3. antiestrogen therapy with anastrozole started 01/17/17 ---------------------------------------------------------------------------- Current treatment: Herceptin every 3 weeks  Shortness of breath: Related to emphysema. Follows Dr. Lamonte Sakai.  Anastrozole Toxicities:Denies any hot flashes or myalgias. She is tolerating it extremely well.  We had lengthy discussion about her echocardiogram scheduled. We will allow her to receive Herceptin without another echocardiogram because her previous echocardiograms were normal. I sent a message to Dr. Algernon Huxley to get a bubble study with her final echocardiogram that will be done in July.  They could not access her veins for blood work today. She does not need any blood work to be drawn any further today.  RTC in 6 weeks for follow up.  Patient will finish adjuvant Herceptin treatment 09/10/2017.  Orders Placed This Encounter   Procedures  . MM DIAG BREAST TOMO BILATERAL    Standing Status:   Future    Standing Expiration Date:   07/03/2018    Order Specific Question:   Reason for Exam (SYMPTOM  OR DIAGNOSIS REQUIRED)    Answer:   Annual mammograms for breast cancer    Order Specific Question:   Preferred imaging location?    Answer:   External    Comments:   [name]   The patient has a good understanding of the overall plan. she agrees with it. she will call with any problems that may develop before the next visit here.   Harriette Ohara, MD 07/02/17

## 2017-07-02 NOTE — Assessment & Plan Note (Signed)
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 adjuvant radiation 10/31/16-11/28/16 3. 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 Byrum.  Anastrozole Toxicities:Denies any hot flashes or myalgias. She is tolerating it extremely well.  RTC in 6 weeks for follow up.Patient will finish adjuvant Herceptin treatment 09/10/2017.   

## 2017-07-02 NOTE — Patient Instructions (Signed)
[facility] Discharge Instructions for Patients Receiving Chemotherapy  Today you received the following chemotherapy agents Herceptin  To help prevent nausea and vomiting after your treatment, we encourage you to take your nausea medication as directed   If you develop nausea and vomiting that is not controlled by your nausea medication, call the clinic.   BELOW ARE SYMPTOMS THAT SHOULD BE REPORTED IMMEDIATELY:  *FEVER GREATER THAN 100.5 F  *CHILLS WITH OR WITHOUT FEVER  NAUSEA AND VOMITING THAT IS NOT CONTROLLED WITH YOUR NAUSEA MEDICATION  *UNUSUAL SHORTNESS OF BREATH  *UNUSUAL BRUISING OR BLEEDING  TENDERNESS IN MOUTH AND THROAT WITH OR WITHOUT PRESENCE OF ULCERS  *URINARY PROBLEMS  *BOWEL PROBLEMS  UNUSUAL RASH Items with * indicate a potential emergency and should be followed up as soon as possible.  Feel free to call the clinic should you have any questions or concerns. The clinic phone number is [phone].  Please show the CHEMO ALERT CARD at check-in to the Emergency Department and triage nurse.   

## 2017-07-05 ENCOUNTER — Other Ambulatory Visit (HOSPITAL_COMMUNITY): Payer: Self-pay

## 2017-07-05 ENCOUNTER — Telehealth: Payer: Self-pay | Admitting: Hematology and Oncology

## 2017-07-05 DIAGNOSIS — Z170 Estrogen receptor positive status [ER+]: Principal | ICD-10-CM

## 2017-07-05 DIAGNOSIS — C50411 Malignant neoplasm of upper-outer quadrant of right female breast: Secondary | ICD-10-CM

## 2017-07-05 NOTE — Telephone Encounter (Signed)
Patient is not due for mammogram until late June. [name] will schedule appointment as soon as they post the orders to their system.

## 2017-07-08 DIAGNOSIS — M25532 Pain in left wrist: Secondary | ICD-10-CM | POA: Diagnosis not present

## 2017-07-08 DIAGNOSIS — R229 Localized swelling, mass and lump, unspecified: Secondary | ICD-10-CM | POA: Diagnosis not present

## 2017-07-08 DIAGNOSIS — M25531 Pain in right wrist: Secondary | ICD-10-CM | POA: Diagnosis not present

## 2017-07-11 ENCOUNTER — Other Ambulatory Visit: Payer: Self-pay | Admitting: Orthopedic Surgery

## 2017-07-16 ENCOUNTER — Encounter: Payer: Self-pay | Admitting: Hematology and Oncology

## 2017-07-16 DIAGNOSIS — Z853 Personal history of malignant neoplasm of breast: Secondary | ICD-10-CM | POA: Diagnosis not present

## 2017-07-17 ENCOUNTER — Other Ambulatory Visit: Payer: Self-pay | Admitting: Orthopedic Surgery

## 2017-07-17 DIAGNOSIS — IMO0002 Reserved for concepts with insufficient information to code with codable children: Secondary | ICD-10-CM

## 2017-07-17 DIAGNOSIS — M25532 Pain in left wrist: Secondary | ICD-10-CM

## 2017-07-17 DIAGNOSIS — R229 Localized swelling, mass and lump, unspecified: Principal | ICD-10-CM

## 2017-07-17 DIAGNOSIS — M25531 Pain in right wrist: Secondary | ICD-10-CM

## 2017-07-22 ENCOUNTER — Other Ambulatory Visit: Payer: Self-pay

## 2017-07-22 DIAGNOSIS — Z853 Personal history of malignant neoplasm of breast: Secondary | ICD-10-CM

## 2017-07-23 ENCOUNTER — Inpatient Hospital Stay: Payer: PPO

## 2017-07-23 ENCOUNTER — Inpatient Hospital Stay: Payer: PPO | Attending: Hematology and Oncology

## 2017-07-23 ENCOUNTER — Inpatient Hospital Stay (HOSPITAL_BASED_OUTPATIENT_CLINIC_OR_DEPARTMENT_OTHER): Payer: PPO | Admitting: Hematology and Oncology

## 2017-07-23 DIAGNOSIS — Z5112 Encounter for antineoplastic immunotherapy: Secondary | ICD-10-CM | POA: Insufficient documentation

## 2017-07-23 DIAGNOSIS — Z79899 Other long term (current) drug therapy: Secondary | ICD-10-CM | POA: Diagnosis not present

## 2017-07-23 DIAGNOSIS — Z923 Personal history of irradiation: Secondary | ICD-10-CM | POA: Diagnosis not present

## 2017-07-23 DIAGNOSIS — Z170 Estrogen receptor positive status [ER+]: Secondary | ICD-10-CM

## 2017-07-23 DIAGNOSIS — C50411 Malignant neoplasm of upper-outer quadrant of right female breast: Secondary | ICD-10-CM | POA: Diagnosis not present

## 2017-07-23 DIAGNOSIS — Z79811 Long term (current) use of aromatase inhibitors: Secondary | ICD-10-CM | POA: Insufficient documentation

## 2017-07-23 DIAGNOSIS — Z853 Personal history of malignant neoplasm of breast: Secondary | ICD-10-CM | POA: Insufficient documentation

## 2017-07-23 DIAGNOSIS — J439 Emphysema, unspecified: Secondary | ICD-10-CM | POA: Insufficient documentation

## 2017-07-23 LAB — CBC WITH DIFFERENTIAL (CANCER CENTER ONLY)
BASOS ABS: 0.1 10*3/uL (ref 0.0–0.1)
BASOS PCT: 1 %
Eosinophils Absolute: 0 10*3/uL (ref 0.0–0.5)
Eosinophils Relative: 1 %
HCT: 39.9 % (ref 34.8–46.6)
HEMOGLOBIN: 13.4 g/dL (ref 11.6–15.9)
LYMPHS ABS: 0.6 10*3/uL — AB (ref 0.9–3.3)
Lymphocytes Relative: 14 %
MCH: 32.4 pg (ref 25.1–34.0)
MCHC: 33.6 g/dL (ref 31.5–36.0)
MCV: 96.5 fL (ref 79.5–101.0)
Monocytes Absolute: 0.5 10*3/uL (ref 0.1–0.9)
Monocytes Relative: 11 %
NEUTROS PCT: 73 %
Neutro Abs: 3.4 10*3/uL (ref 1.5–6.5)
PLATELETS: 244 10*3/uL (ref 145–400)
RBC: 4.14 MIL/uL (ref 3.70–5.45)
RDW: 13.9 % (ref 11.2–14.5)
WBC: 4.6 10*3/uL (ref 3.9–10.3)

## 2017-07-23 LAB — CMP (CANCER CENTER ONLY)
ALT: 19 U/L (ref 0–55)
AST: 19 U/L (ref 5–34)
Albumin: 4.4 g/dL (ref 3.5–5.0)
Alkaline Phosphatase: 81 U/L (ref 40–150)
Anion gap: 11 (ref 3–11)
BILIRUBIN TOTAL: 0.6 mg/dL (ref 0.2–1.2)
BUN: 14 mg/dL (ref 7–26)
CALCIUM: 9.7 mg/dL (ref 8.4–10.4)
CHLORIDE: 104 mmol/L (ref 98–109)
CO2: 23 mmol/L (ref 22–29)
CREATININE: 0.81 mg/dL (ref 0.60–1.10)
Glucose, Bld: 99 mg/dL (ref 70–140)
Potassium: 4 mmol/L (ref 3.5–5.1)
Sodium: 138 mmol/L (ref 136–145)
TOTAL PROTEIN: 6.9 g/dL (ref 6.4–8.3)

## 2017-07-23 MED ORDER — SODIUM CHLORIDE 0.9 % IV SOLN
6.0000 mg/kg | Freq: Once | INTRAVENOUS | Status: AC
  Administered 2017-07-23: 357 mg via INTRAVENOUS
  Filled 2017-07-23: qty 17

## 2017-07-23 MED ORDER — DIPHENHYDRAMINE HCL 25 MG PO CAPS
ORAL_CAPSULE | ORAL | Status: AC
  Filled 2017-07-23: qty 1

## 2017-07-23 MED ORDER — ACETAMINOPHEN 325 MG PO TABS
ORAL_TABLET | ORAL | Status: AC
  Filled 2017-07-23: qty 2

## 2017-07-23 MED ORDER — SODIUM CHLORIDE 0.9 % IV SOLN
Freq: Once | INTRAVENOUS | Status: AC
  Administered 2017-07-23: 15:00:00 via INTRAVENOUS

## 2017-07-23 MED ORDER — ACETAMINOPHEN 325 MG PO TABS
650.0000 mg | ORAL_TABLET | Freq: Once | ORAL | Status: AC
  Administered 2017-07-23: 650 mg via ORAL

## 2017-07-23 MED ORDER — DIPHENHYDRAMINE HCL 25 MG PO CAPS
25.0000 mg | ORAL_CAPSULE | Freq: Once | ORAL | Status: AC
  Administered 2017-07-23: 25 mg via ORAL

## 2017-07-23 NOTE — Progress Notes (Signed)
Patient Care Team: Crist Infante, MD as PCP - General (Internal Medicine) [name], MD as Consulting Physician (General Surgery) Magrinat, [name], MD as Consulting Physician (Oncology) Kyung Rudd, MD as Consulting Physician (Radiation Oncology) Delice Bison Charlestine Massed, NP as Nurse Practitioner (Hematology and Oncology)  DIAGNOSIS:  Encounter Diagnosis  Name Primary?  . Malignant neoplasm of upper-outer quadrant of right breast in female, estrogen receptor positive ([place])     SUMMARY OF ONCOLOGIC HISTORY:   Malignant neoplasm of upper-outer quadrant of right breast in female, estrogen receptor positive ([place])   1991 Initial Biopsy    Left Breast cancer treated with breast conserving surgery followed by chemotherapy and radiation, unknown receptor status and unknown chemotherapy regimen      07/18/2016 Initial Diagnosis    Right breast calcifications 4 mm size, biopsy revealed DCIS with calcifications low intermediate grade ER 100%, PR 50%, Tis Nx stage 0      08/09/2016 Surgery    Right lumpectomy: 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, with DCIS with calcifications, ER 100%, PR 95%, Ki-67 3%, HER-2 positive ratio 2.3, T1c Nx stage IA      09/11/2016 -  Chemotherapy    Herceptin every 3 weeks  1 year       10/31/2016 - 11/28/2016 Radiation Therapy    Adjuvant Radiation [facility]): Right breast / 42.56 Gy in 16 fractions  Boost: 10 Gy in 4 fractions        02/26/2017 -  Anti-estrogen oral therapy    Anastrozole 1 mg daily       CHIEF COMPLIANT: Follow-up on anastrozole therapy along with Herceptin  INTERVAL HISTORY: Anna York is a 77 year old with above-mentioned history of right breast cancer treated with lumpectomy and radiation.  She is currently on Herceptin and she appears to be tolerating it very well.  She will finish Herceptin in July.  She is also on anastrozole therapy and appears to be tolerating that fairly well without any hot flashes or  myalgias.  REVIEW OF SYSTEMS:   Constitutional: Denies fevers, chills or abnormal weight loss Eyes: Denies blurriness of vision Ears, nose, mouth, throat, and face: Denies mucositis or sore throat Respiratory: Denies cough, dyspnea or wheezes Cardiovascular: Denies palpitation, chest discomfort Gastrointestinal:  Denies nausea, heartburn or change in bowel habits Skin: Denies abnormal skin rashes Lymphatics: Denies new lymphadenopathy or easy bruising Neurological:Denies numbness, tingling or new weaknesses Behavioral/Psych: Mood is stable, no new changes  Extremities: No lower extremity edema Breast:  denies any pain or lumps or nodules in either breasts All other systems were reviewed with the patient and are negative.  I have reviewed the past medical history, past surgical history, social history and family history with the patient and they are unchanged from previous note.  ALLERGIES:  is allergic to erythromycin and macrobid [nitrofurantoin].  MEDICATIONS:  Current Outpatient Medications  Medication Sig Dispense Refill  . albuterol (PROVENTIL HFA;VENTOLIN HFA) 108 (90 Base) MCG/ACT inhaler Inhale 2 puffs into the lungs every 6 (six) hours as needed. 1 Inhaler 3  . anastrozole (ARIMIDEX) 1 MG tablet Take 1 tablet (1 mg total) by mouth daily. 90 tablet 3  . Cholecalciferol (VITAMIN D) 2000 units CAPS Take 2,000 Units by mouth daily.    . clopidogrel (PLAVIX) 75 MG tablet Take 75 mg by mouth daily.      . Cyanocobalamin (B-12 PO) Take 1 tablet by mouth daily.    Marland Kitchen levofloxacin (LEVAQUIN) 500 MG tablet Take 1 tablet (500 mg total) by mouth  daily. 3 tablet 1  . metoprolol succinate (TOPROL-XL) 25 MG 24 hr tablet Take 12.5 mg by mouth daily.    Marland Kitchen nystatin (MYCOSTATIN) 100000 UNIT/ML suspension     . omeprazole (PRILOSEC) 20 MG capsule Take 20 mg by mouth daily.    . pravastatin (PRAVACHOL) 20 MG tablet Take 20 mg by mouth 2 (two) times a week.     . SPIRIVA HANDIHALER 18 MCG  inhalation capsule INHALE CONTENTS OF ONE CAPSULE ONCE A DAY 30 capsule 5  . SYMBICORT 160-4.5 MCG/ACT inhaler TWO PUFFS TWICE DAILY 10.2 g 5  . trastuzumab (HERCEPTIN) 150 MG SOLR injection Inject into the vein.     No current facility-administered medications for this visit.     PHYSICAL EXAMINATION: ECOG PERFORMANCE STATUS: 1 - Symptomatic but completely ambulatory  Vitals:   07/23/17 1433  BP: (!) 160/79  Pulse: 81  Resp: 19  Temp: 98.6 F (37 C)  SpO2: 95%   Filed Weights   07/23/17 1433  Weight: 127 lb 12.8 oz (58 kg)    GENERAL:alert, no distress and comfortable SKIN: skin color, texture, turgor are normal, no rashes or significant lesions EYES: normal, Conjunctiva are pink and non-injected, sclera clear OROPHARYNX:no exudate, no erythema and lips, buccal mucosa, and tongue normal  NECK: supple, thyroid normal size, non-tender, without nodularity LYMPH:  no palpable lymphadenopathy in the cervical, axillary or inguinal LUNGS: clear to auscultation and percussion with normal breathing effort HEART: regular rate & rhythm and no murmurs and no lower extremity edema ABDOMEN:abdomen soft, non-tender and normal bowel sounds MUSCULOSKELETAL:no cyanosis of digits and no clubbing  NEURO: alert & oriented x 3 with fluent speech, no focal motor/sensory deficits EXTREMITIES: No lower extremity edema  LABORATORY DATA:  I have reviewed the data as listed CMP Latest Ref Rng & Units 05/21/2017 04/09/2017 01/15/2017  Glucose 70 - 140 mg/dL 100 97 93  BUN 7 - 26 mg/dL 15 14 12.7  Creatinine 0.60 - 1.10 mg/dL 0.83 0.89 0.8  Sodium 136 - 145 mmol/L 138 138 140  Potassium 3.5 - 5.1 mmol/L 4.0 4.2 3.9  Chloride 98 - 109 mmol/L 106 105 -  CO2 22 - 29 mmol/L 22 22 20(L)  Calcium 8.4 - 10.4 mg/dL 9.6 9.6 9.3  Total Protein 6.4 - 8.3 g/dL 6.7 6.9 6.7  Total Bilirubin 0.2 - 1.2 mg/dL 0.5 0.5 0.46  Alkaline Phos 40 - 150 U/L 72 62 64  AST 5 - 34 U/L '20 18 20  ' ALT 0 - 55 U/L '18 14 20     ' Lab Results  Component Value Date   WBC 4.6 07/23/2017   HGB 13.4 07/23/2017   HCT 39.9 07/23/2017   MCV 96.5 07/23/2017   PLT 244 07/23/2017   NEUTROABS 3.4 07/23/2017    ASSESSMENT & PLAN:  Malignant neoplasm of upper-outer quadrant of right breast in female, estrogen receptor positive ([place]) 08/09/2016 Right lumpectomy: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 adjuvant radiation 10/31/16-11/28/16 3. 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 Lamonte Sakai.  Anastrozole Toxicities:Denies any hot flashes or myalgias. She is tolerating it extremely well.  RTC in 6 weeks for follow up.Patient will finish adjuvant Herceptin treatment 09/10/2017.  I  will see the patient in 6 months for follow-up on anastrozole therapy.   No orders of the defined types were placed in this encounter.  The patient has a good understanding of the overall plan. she agrees with it. she will call with any problems that may develop before the next visit here.   Harriette Ohara, MD 07/23/17

## 2017-07-23 NOTE — Patient Instructions (Signed)
[facility] Discharge Instructions for Patients Receiving Chemotherapy  Today you received the following chemotherapy agents Herceptin  To help prevent nausea and vomiting after your treatment, we encourage you to take your nausea medication as directed   If you develop nausea and vomiting that is not controlled by your nausea medication, call the clinic.   BELOW ARE SYMPTOMS THAT SHOULD BE REPORTED IMMEDIATELY:  *FEVER GREATER THAN 100.5 F  *CHILLS WITH OR WITHOUT FEVER  NAUSEA AND VOMITING THAT IS NOT CONTROLLED WITH YOUR NAUSEA MEDICATION  *UNUSUAL SHORTNESS OF BREATH  *UNUSUAL BRUISING OR BLEEDING  TENDERNESS IN MOUTH AND THROAT WITH OR WITHOUT PRESENCE OF ULCERS  *URINARY PROBLEMS  *BOWEL PROBLEMS  UNUSUAL RASH Items with * indicate a potential emergency and should be followed up as soon as possible.  Feel free to call the clinic should you have any questions or concerns. The clinic phone number is [phone].  Please show the CHEMO ALERT CARD at check-in to the Emergency Department and triage nurse.   

## 2017-07-23 NOTE — Assessment & Plan Note (Signed)
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 adjuvant radiation 10/31/16-11/28/16 3. 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 Lamonte Sakai.  Anastrozole Toxicities:Denies any hot flashes or myalgias. She is tolerating it extremely well.  RTC in 6 weeks for follow up.Patient will finish adjuvant Herceptin treatment 09/10/2017.

## 2017-07-24 ENCOUNTER — Telehealth: Payer: Self-pay | Admitting: Adult Health

## 2017-07-24 ENCOUNTER — Telehealth: Payer: Self-pay | Admitting: Hematology and Oncology

## 2017-07-24 NOTE — Telephone Encounter (Signed)
Called pt and left message.  ([facility]) Rescheduled 7/23 lab/LC/inf appt to 7/24 beginning at 11 am.  Mailed calendars for June and July.

## 2017-07-24 NOTE — Telephone Encounter (Signed)
Mailed patient calendar of upcoming December appointments.  °

## 2017-08-01 ENCOUNTER — Other Ambulatory Visit: Payer: Self-pay | Admitting: Emergency Medicine

## 2017-08-05 ENCOUNTER — Ambulatory Visit
Admission: RE | Admit: 2017-08-05 | Discharge: 2017-08-05 | Disposition: A | Discharge status: Home / Self Care | Payer: PPO | Source: Ambulatory Visit | Attending: Orthopedic Surgery | Admitting: Orthopedic Surgery

## 2017-08-05 DIAGNOSIS — IMO0002 Reserved for concepts with insufficient information to code with codable children: Secondary | ICD-10-CM

## 2017-08-05 DIAGNOSIS — R229 Localized swelling, mass and lump, unspecified: Principal | ICD-10-CM

## 2017-08-05 DIAGNOSIS — M25532 Pain in left wrist: Secondary | ICD-10-CM

## 2017-08-05 DIAGNOSIS — M25531 Pain in right wrist: Secondary | ICD-10-CM

## 2017-08-05 DIAGNOSIS — S63591A Other specified sprain of right wrist, initial encounter: Secondary | ICD-10-CM | POA: Diagnosis not present

## 2017-08-05 MED ORDER — IOPAMIDOL (ISOVUE-M 200) INJECTION 41%
2.0000 mL | Freq: Once | INTRAMUSCULAR | Status: AC
  Administered 2017-08-05: 2 mL via INTRA_ARTICULAR

## 2017-08-07 DIAGNOSIS — M92211 Osteochondrosis (juvenile) of carpal lunate [Kienbock], right hand: Secondary | ICD-10-CM | POA: Diagnosis not present

## 2017-08-09 ENCOUNTER — Other Ambulatory Visit (HOSPITAL_COMMUNITY): Payer: Self-pay | Admitting: Orthopedic Surgery

## 2017-08-09 ENCOUNTER — Telehealth: Payer: Self-pay | Admitting: *Deleted

## 2017-08-09 DIAGNOSIS — M92211 Osteochondrosis (juvenile) of carpal lunate [Kienbock], right hand: Secondary | ICD-10-CM

## 2017-08-09 NOTE — Telephone Encounter (Signed)
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  She states " I usually do not have labs but when I rescheduled my appointments labs were added "  This RN informed pt above information will be given to MD for review - if labs are needed they may be obtained with IV start.  Note pt has been advised for no needle sticks to affected area in her Left wrist. She states she cannot have needle sticks in her Right arm due to prior surgeries for breast cancer.  This note will be given to MD for review and if approved - lab appointments may be canceled.  Pt would like a call to verify review by MD per above issue,

## 2017-08-09 NOTE — Telephone Encounter (Signed)
See other entry same date 

## 2017-08-13 ENCOUNTER — Telehealth: Payer: Self-pay

## 2017-08-13 NOTE — Telephone Encounter (Signed)
Per telephone note from 6-21 with Val RN, pt's next lab appt for July 3rd prior to her chemo has been cancelled per Dr Lindi Adie after he reviewed the telephone note 6-21.  Outgoing call to patient at this time, no answer, I left a VM and left her know that the lab appt for July 3rd has been cancelled per Dr Lindi Adie.  Left our contact info if any further questions.

## 2017-08-19 ENCOUNTER — Ambulatory Visit (HOSPITAL_COMMUNITY)
Admission: RE | Admit: 2017-08-19 | Discharge: 2017-08-19 | Disposition: A | Discharge status: Home / Self Care | Payer: PPO | Source: Ambulatory Visit | Attending: Orthopedic Surgery | Admitting: Orthopedic Surgery

## 2017-08-19 DIAGNOSIS — M25531 Pain in right wrist: Secondary | ICD-10-CM | POA: Diagnosis not present

## 2017-08-19 DIAGNOSIS — M92211 Osteochondrosis (juvenile) of carpal lunate [Kienbock], right hand: Secondary | ICD-10-CM | POA: Insufficient documentation

## 2017-08-19 MED ORDER — TECHNETIUM TC 99M MEDRONATE IV KIT
20.0000 | PACK | Freq: Once | INTRAVENOUS | Status: AC | PRN
  Administered 2017-08-19: 20 via INTRAVENOUS

## 2017-08-20 ENCOUNTER — Inpatient Hospital Stay: Payer: PPO | Attending: Hematology and Oncology

## 2017-08-20 ENCOUNTER — Inpatient Hospital Stay: Payer: PPO

## 2017-08-20 VITALS — BP 153/74 | HR 88 | Temp 97.4°F | Resp 16

## 2017-08-20 DIAGNOSIS — Z87891 Personal history of nicotine dependence: Secondary | ICD-10-CM | POA: Insufficient documentation

## 2017-08-20 DIAGNOSIS — Z5112 Encounter for antineoplastic immunotherapy: Secondary | ICD-10-CM | POA: Diagnosis not present

## 2017-08-20 DIAGNOSIS — Z79811 Long term (current) use of aromatase inhibitors: Secondary | ICD-10-CM | POA: Diagnosis not present

## 2017-08-20 DIAGNOSIS — J449 Chronic obstructive pulmonary disease, unspecified: Secondary | ICD-10-CM | POA: Diagnosis not present

## 2017-08-20 DIAGNOSIS — C50411 Malignant neoplasm of upper-outer quadrant of right female breast: Secondary | ICD-10-CM | POA: Insufficient documentation

## 2017-08-20 DIAGNOSIS — Z8673 Personal history of transient ischemic attack (TIA), and cerebral infarction without residual deficits: Secondary | ICD-10-CM | POA: Insufficient documentation

## 2017-08-20 DIAGNOSIS — Z79899 Other long term (current) drug therapy: Secondary | ICD-10-CM | POA: Insufficient documentation

## 2017-08-20 DIAGNOSIS — Z170 Estrogen receptor positive status [ER+]: Secondary | ICD-10-CM | POA: Insufficient documentation

## 2017-08-20 DIAGNOSIS — M818 Other osteoporosis without current pathological fracture: Secondary | ICD-10-CM | POA: Diagnosis not present

## 2017-08-20 MED ORDER — ACETAMINOPHEN 325 MG PO TABS
650.0000 mg | ORAL_TABLET | Freq: Once | ORAL | Status: AC
  Administered 2017-08-20: 650 mg via ORAL

## 2017-08-20 MED ORDER — TRASTUZUMAB CHEMO 150 MG IV SOLR
6.0000 mg/kg | Freq: Once | INTRAVENOUS | Status: AC
  Administered 2017-08-20: 357 mg via INTRAVENOUS
  Filled 2017-08-20: qty 17

## 2017-08-20 MED ORDER — SODIUM CHLORIDE 0.9 % IV SOLN
Freq: Once | INTRAVENOUS | Status: AC
  Administered 2017-08-20: 16:00:00 via INTRAVENOUS

## 2017-08-20 MED ORDER — ACETAMINOPHEN 325 MG PO TABS
ORAL_TABLET | ORAL | Status: AC
  Filled 2017-08-20: qty 2

## 2017-08-20 MED ORDER — DIPHENHYDRAMINE HCL 25 MG PO CAPS
25.0000 mg | ORAL_CAPSULE | Freq: Once | ORAL | Status: AC
  Administered 2017-08-20: 25 mg via ORAL

## 2017-08-20 MED ORDER — DIPHENHYDRAMINE HCL 25 MG PO CAPS
ORAL_CAPSULE | ORAL | Status: AC
  Filled 2017-08-20: qty 1

## 2017-08-20 NOTE — Patient Instructions (Signed)
[facility] Discharge Instructions for Patients Receiving Chemotherapy  Today you received the following chemotherapy agents Herceptin  To help prevent nausea and vomiting after your treatment, we encourage you to take your nausea medication as directed   If you develop nausea and vomiting that is not controlled by your nausea medication, call the clinic.   BELOW ARE SYMPTOMS THAT SHOULD BE REPORTED IMMEDIATELY:  *FEVER GREATER THAN 100.5 F  *CHILLS WITH OR WITHOUT FEVER  NAUSEA AND VOMITING THAT IS NOT CONTROLLED WITH YOUR NAUSEA MEDICATION  *UNUSUAL SHORTNESS OF BREATH  *UNUSUAL BRUISING OR BLEEDING  TENDERNESS IN MOUTH AND THROAT WITH OR WITHOUT PRESENCE OF ULCERS  *URINARY PROBLEMS  *BOWEL PROBLEMS  UNUSUAL RASH Items with * indicate a potential emergency and should be followed up as soon as possible.  Feel free to call the clinic should you have any questions or concerns. The clinic phone number is [phone].  Please show the CHEMO ALERT CARD at check-in to the Emergency Department and triage nurse.   

## 2017-08-21 DIAGNOSIS — M25532 Pain in left wrist: Secondary | ICD-10-CM | POA: Diagnosis not present

## 2017-08-21 DIAGNOSIS — M25531 Pain in right wrist: Secondary | ICD-10-CM | POA: Diagnosis not present

## 2017-08-21 DIAGNOSIS — R229 Localized swelling, mass and lump, unspecified: Secondary | ICD-10-CM | POA: Diagnosis not present

## 2017-08-23 DIAGNOSIS — E559 Vitamin D deficiency, unspecified: Secondary | ICD-10-CM | POA: Diagnosis not present

## 2017-08-23 DIAGNOSIS — I10 Essential (primary) hypertension: Secondary | ICD-10-CM | POA: Diagnosis not present

## 2017-08-23 DIAGNOSIS — R82998 Other abnormal findings in urine: Secondary | ICD-10-CM | POA: Diagnosis not present

## 2017-08-23 DIAGNOSIS — D539 Nutritional anemia, unspecified: Secondary | ICD-10-CM | POA: Diagnosis not present

## 2017-08-23 DIAGNOSIS — E7849 Other hyperlipidemia: Secondary | ICD-10-CM | POA: Diagnosis not present

## 2017-08-23 DIAGNOSIS — D538 Other specified nutritional anemias: Secondary | ICD-10-CM | POA: Diagnosis not present

## 2017-09-03 DIAGNOSIS — Z6821 Body mass index (BMI) 21.0-21.9, adult: Secondary | ICD-10-CM | POA: Diagnosis not present

## 2017-09-03 DIAGNOSIS — K529 Noninfective gastroenteritis and colitis, unspecified: Secondary | ICD-10-CM | POA: Diagnosis not present

## 2017-09-03 DIAGNOSIS — G458 Other transient cerebral ischemic attacks and related syndromes: Secondary | ICD-10-CM | POA: Diagnosis not present

## 2017-09-03 DIAGNOSIS — R05 Cough: Secondary | ICD-10-CM | POA: Diagnosis not present

## 2017-09-03 DIAGNOSIS — M859 Disorder of bone density and structure, unspecified: Secondary | ICD-10-CM | POA: Diagnosis not present

## 2017-09-03 DIAGNOSIS — C50919 Malignant neoplasm of unspecified site of unspecified female breast: Secondary | ICD-10-CM | POA: Diagnosis not present

## 2017-09-03 DIAGNOSIS — M25511 Pain in right shoulder: Secondary | ICD-10-CM | POA: Diagnosis not present

## 2017-09-03 DIAGNOSIS — E7849 Other hyperlipidemia: Secondary | ICD-10-CM | POA: Diagnosis not present

## 2017-09-03 DIAGNOSIS — R011 Cardiac murmur, unspecified: Secondary | ICD-10-CM | POA: Diagnosis not present

## 2017-09-03 DIAGNOSIS — D538 Other specified nutritional anemias: Secondary | ICD-10-CM | POA: Diagnosis not present

## 2017-09-03 DIAGNOSIS — Z1389 Encounter for screening for other disorder: Secondary | ICD-10-CM | POA: Diagnosis not present

## 2017-09-03 DIAGNOSIS — Z0000 Encounter for general adult medical examination without abnormal findings: Secondary | ICD-10-CM | POA: Diagnosis not present

## 2017-09-03 DIAGNOSIS — J449 Chronic obstructive pulmonary disease, unspecified: Secondary | ICD-10-CM | POA: Diagnosis not present

## 2017-09-04 ENCOUNTER — Other Ambulatory Visit (HOSPITAL_COMMUNITY): Payer: Self-pay | Admitting: *Deleted

## 2017-09-04 ENCOUNTER — Ambulatory Visit (HOSPITAL_COMMUNITY)
Admission: RE | Admit: 2017-09-04 | Discharge: 2017-09-04 | Disposition: A | Discharge status: Home / Self Care | Payer: PPO | Source: Ambulatory Visit | Attending: Internal Medicine | Admitting: Internal Medicine

## 2017-09-04 ENCOUNTER — Other Ambulatory Visit (HOSPITAL_COMMUNITY): Payer: Self-pay | Admitting: Cardiology

## 2017-09-04 ENCOUNTER — Ambulatory Visit (HOSPITAL_BASED_OUTPATIENT_CLINIC_OR_DEPARTMENT_OTHER)
Admission: RE | Admit: 2017-09-04 | Discharge: 2017-09-04 | Disposition: A | Discharge status: Home / Self Care | Payer: PPO | Source: Ambulatory Visit | Attending: Cardiology | Admitting: Cardiology

## 2017-09-04 ENCOUNTER — Encounter (HOSPITAL_COMMUNITY): Payer: Self-pay | Admitting: Cardiology

## 2017-09-04 VITALS — BP 142/84 | HR 68 | Wt 128.8 lb

## 2017-09-04 DIAGNOSIS — G459 Transient cerebral ischemic attack, unspecified: Secondary | ICD-10-CM | POA: Diagnosis not present

## 2017-09-04 DIAGNOSIS — Z79899 Other long term (current) drug therapy: Secondary | ICD-10-CM | POA: Diagnosis not present

## 2017-09-04 DIAGNOSIS — Z87891 Personal history of nicotine dependence: Secondary | ICD-10-CM | POA: Insufficient documentation

## 2017-09-04 DIAGNOSIS — Z170 Estrogen receptor positive status [ER+]: Secondary | ICD-10-CM

## 2017-09-04 DIAGNOSIS — C50411 Malignant neoplasm of upper-outer quadrant of right female breast: Secondary | ICD-10-CM

## 2017-09-04 DIAGNOSIS — Z08 Encounter for follow-up examination after completed treatment for malignant neoplasm: Secondary | ICD-10-CM | POA: Diagnosis not present

## 2017-09-04 DIAGNOSIS — Z79811 Long term (current) use of aromatase inhibitors: Secondary | ICD-10-CM | POA: Insufficient documentation

## 2017-09-04 DIAGNOSIS — J449 Chronic obstructive pulmonary disease, unspecified: Secondary | ICD-10-CM | POA: Diagnosis not present

## 2017-09-04 DIAGNOSIS — Z803 Family history of malignant neoplasm of breast: Secondary | ICD-10-CM | POA: Diagnosis not present

## 2017-09-04 DIAGNOSIS — Z7902 Long term (current) use of antithrombotics/antiplatelets: Secondary | ICD-10-CM | POA: Diagnosis not present

## 2017-09-04 DIAGNOSIS — Z8249 Family history of ischemic heart disease and other diseases of the circulatory system: Secondary | ICD-10-CM | POA: Diagnosis not present

## 2017-09-04 DIAGNOSIS — I083 Combined rheumatic disorders of mitral, aortic and tricuspid valves: Secondary | ICD-10-CM | POA: Insufficient documentation

## 2017-09-04 DIAGNOSIS — Z8774 Personal history of (corrected) congenital malformations of heart and circulatory system: Secondary | ICD-10-CM | POA: Diagnosis not present

## 2017-09-04 DIAGNOSIS — Z7951 Long term (current) use of inhaled steroids: Secondary | ICD-10-CM | POA: Insufficient documentation

## 2017-09-04 NOTE — Patient Instructions (Signed)
Follow up as needed

## 2017-09-04 NOTE — Progress Notes (Signed)
Oncology: Dr. Lindi York  77 yo with history of COPD, prior TIAs, and breast cancer presents for cardio-oncology evaluation, she was referred by Dr. Lindi York.  She had left breast cancer diagnosed in 1991, had lumpectomy/radiation/chemo.  She had recurrence of right breast cancer in 5/18. Biopsy ER+/PR+/HER2+.  Plan for lumpectomy + radiation and 1 year of Herceptin.  She will finish Herceptin this month.   She returns for followup of breast cancer/Herceptin use. She is at baseline symptomatically.  She is short of breath walking up a hill or walking fast.  No dyspnea with a steady pace.  This is chronic for her and likely related to COPD.    PMH: 1. COPD: Chest CT and PFTs both suggestive of significant COPD.  2. TIAs: About 8 years ago.  Echo in 2010 showed a positive bubble study suggestive of PFO.  3. Breast cancer: Left breast cancer diagnosed in 1991, had lumpectomy/radiation/chemo.  She had recurrence of right breast cancer in 5/18. Biopsy ER+/PR+/HER2+.  Plan for lumpectomy + radiation and 6 months of Herceptin q 3 wks.  - Echo (7/18): EF 55-60%, mild AI, mild MR, normal RV size and systolic function.  - Echo (10/18): EF 60-65%, GLS -22.1%.  - Echo (2/19): EF 55-60%, mild MR, mild AI, GLS -17.5% - Echo (7/19): EF 55-60%, GLS -20.5%, normal RV size and systolic function, negative bubble study.   Social History   Socioeconomic History  . Marital status: Married    Spouse name: Not on file  . Number of children: 1  . Years of education: Not on file  . Highest education level: Not on file  Occupational History  . Occupation: realtor    Comment: [name]    Employer: [employer]  . Financial resource strain: Not on file  . Food insecurity:    Worry: Not on file    Inability: Not on file  . Transportation needs:    Medical: Not on file    Non-medical: Not on file  Tobacco Use  . Smoking status: Former Smoker    Types: Cigarettes    Last attempt to quit: 02/20/2000   Years since quitting: 17.5  . Smokeless tobacco: Never Used  Substance and Sexual Activity  . Alcohol use: Yes    Alcohol/week: 8.4 oz    Types: 14 Glasses of wine per week    Comment: daily  . Drug use: No  . Sexual activity: Not Currently    Birth control/protection: Post-menopausal    Comment: 1st intercourse 77 yo-Fewer than 5 partners  Lifestyle  . Physical activity:    Days per week: Not on file    Minutes per session: Not on file  . Stress: Not on file  Relationships  . Social connections:    Talks on phone: Not on file    Gets together: Not on file    Attends religious service: Not on file    Active member of club or organization: Not on file    Attends meetings of clubs or organizations: Not on file    Relationship status: Not on file  . Intimate partner violence:    Fear of current or ex partner: Not on file    Emotionally abused: Not on file    Physically abused: Not on file    Forced sexual activity: Not on file  Other Topics Concern  . Not on file  Social History Narrative   Married to Dr Anna York   Family History  Problem  Relation Age of Onset  . Breast cancer Mother        Age 50  . Hypertension Father    ROS: All systems reviewed and negative except as per HPI.   Current Outpatient Medications  Medication Sig Dispense Refill  . albuterol (PROVENTIL HFA;VENTOLIN HFA) 108 (90 Base) MCG/ACT inhaler Inhale 2 puffs into the lungs every 6 (six) hours as needed. 1 Inhaler 3  . anastrozole (ARIMIDEX) 1 MG tablet Take 1 tablet (1 mg total) by mouth daily. 90 tablet 3  . Cholecalciferol (VITAMIN D) 2000 units CAPS Take 2,000 Units by mouth daily.    . clopidogrel (PLAVIX) 75 MG tablet Take 75 mg by mouth daily.      . Cyanocobalamin (B-12 PO) Take 1 tablet by mouth daily.    . metoprolol succinate (TOPROL-XL) 25 MG 24 hr tablet Take 12.5 mg by mouth daily.    Marland Kitchen nystatin (MYCOSTATIN) 100000 UNIT/ML suspension     . omeprazole (PRILOSEC) 20 MG capsule  Take 20 mg by mouth daily.    . pravastatin (PRAVACHOL) 20 MG tablet Take 20 mg by mouth 2 (two) times a week.     . SPIRIVA HANDIHALER 18 MCG inhalation capsule INHALE CONTENTS OF ONE CAPSULE ONCE A DAY 30 capsule 5  . SYMBICORT 160-4.5 MCG/ACT inhaler TWO PUFFS TWICE DAILY 10.2 g 5  . trastuzumab (HERCEPTIN) 150 MG SOLR injection Inject into the vein.     No current facility-administered medications for this encounter.    BP (!) 142/84   Pulse 68   Wt 128 lb 12.8 oz (58.4 kg)   SpO2 97%   BMI 21.43 kg/m  General: NAD Neck: No JVD, no thyromegaly or thyroid nodule.  Lungs: mildly decreased breath sounds.  CV: Nondisplaced PMI.  Heart regular S1/S2, no S3/S4, no murmur.  No peripheral edema.  No carotid bruit.  Normal pedal pulses.  Abdomen: Soft, nontender, no hepatosplenomegaly, no distention.  Skin: Intact without lesions or rashes.  Neurologic: Alert and oriented x 3.  Psych: Normal affect. Extremities: No clubbing or cyanosis.  HEENT: Normal.   Assessment/Plan: 1. TIAs: TIAs in 2010.  Echo at that time showed positive bubble study, likely a PFO.  She was started on Plavix and has not had a TIA or CVA since that time.  - Continue Plavix.  - I reviewed today's echo, bubble study was negative so no definite sign of PFO was seen.  2. Breast cancer: Patient completes Herceptin this month.  Echo was done today and reviewed => EF and strain stable.  She will not need further screening echoes.  3. COPD: No longer smokes.  Has baseline dyspnea.   Followup as needed.   Anna York 09/04/2017

## 2017-09-04 NOTE — Progress Notes (Signed)
  Echocardiogram 2D Echocardiogram has been performed.  Anna York 09/04/2017, 12:14 PM

## 2017-09-10 ENCOUNTER — Ambulatory Visit: Payer: PPO | Admitting: Adult Health

## 2017-09-10 ENCOUNTER — Other Ambulatory Visit: Payer: Self-pay

## 2017-09-10 ENCOUNTER — Ambulatory Visit: Payer: PPO

## 2017-09-10 ENCOUNTER — Other Ambulatory Visit: Payer: PPO

## 2017-09-10 DIAGNOSIS — Z170 Estrogen receptor positive status [ER+]: Principal | ICD-10-CM

## 2017-09-10 DIAGNOSIS — C50411 Malignant neoplasm of upper-outer quadrant of right female breast: Secondary | ICD-10-CM

## 2017-09-11 ENCOUNTER — Encounter: Payer: Self-pay | Admitting: Adult Health

## 2017-09-11 ENCOUNTER — Inpatient Hospital Stay: Payer: PPO

## 2017-09-11 ENCOUNTER — Inpatient Hospital Stay (HOSPITAL_BASED_OUTPATIENT_CLINIC_OR_DEPARTMENT_OTHER): Payer: PPO | Admitting: Adult Health

## 2017-09-11 VITALS — BP 170/68 | HR 82 | Temp 98.0°F | Resp 16 | Ht 65.0 in | Wt 129.3 lb

## 2017-09-11 DIAGNOSIS — J449 Chronic obstructive pulmonary disease, unspecified: Secondary | ICD-10-CM | POA: Diagnosis not present

## 2017-09-11 DIAGNOSIS — Z79811 Long term (current) use of aromatase inhibitors: Secondary | ICD-10-CM

## 2017-09-11 DIAGNOSIS — Z170 Estrogen receptor positive status [ER+]: Secondary | ICD-10-CM | POA: Diagnosis not present

## 2017-09-11 DIAGNOSIS — Z5112 Encounter for antineoplastic immunotherapy: Secondary | ICD-10-CM | POA: Diagnosis not present

## 2017-09-11 DIAGNOSIS — C50411 Malignant neoplasm of upper-outer quadrant of right female breast: Secondary | ICD-10-CM

## 2017-09-11 DIAGNOSIS — Z87891 Personal history of nicotine dependence: Secondary | ICD-10-CM | POA: Diagnosis not present

## 2017-09-11 DIAGNOSIS — M818 Other osteoporosis without current pathological fracture: Secondary | ICD-10-CM

## 2017-09-11 LAB — CMP (CANCER CENTER ONLY)
ALBUMIN: 4.4 g/dL (ref 3.5–5.0)
ALK PHOS: 89 U/L (ref 38–126)
ALT: 17 U/L (ref 0–44)
AST: 19 U/L (ref 15–41)
Anion gap: 10 (ref 5–15)
BILIRUBIN TOTAL: 0.5 mg/dL (ref 0.3–1.2)
BUN: 15 mg/dL (ref 8–23)
CALCIUM: 9.8 mg/dL (ref 8.9–10.3)
CO2: 25 mmol/L (ref 22–32)
Chloride: 104 mmol/L (ref 98–111)
Creatinine: 0.82 mg/dL (ref 0.44–1.00)
GFR, Est AFR Am: >60 mL/min (ref >60)
GLUCOSE: 90 mg/dL (ref 70–99)
POTASSIUM: 4.3 mmol/L (ref 3.5–5.1)
Sodium: 139 mmol/L (ref 135–145)
TOTAL PROTEIN: 7 g/dL (ref 6.5–8.1)

## 2017-09-11 LAB — CBC WITH DIFFERENTIAL (CANCER CENTER ONLY)
BASOS PCT: 1 %
Basophils Absolute: 0.1 10*3/uL (ref 0.0–0.1)
EOS PCT: 1 %
Eosinophils Absolute: 0.1 10*3/uL (ref 0.0–0.5)
HEMATOCRIT: 42.9 % (ref 34.8–46.6)
HEMOGLOBIN: 14.1 g/dL (ref 11.6–15.9)
LYMPHS PCT: 11 %
Lymphs Abs: 0.7 10*3/uL — ABNORMAL LOW (ref 0.9–3.3)
MCH: 32.5 pg (ref 25.1–34.0)
MCHC: 32.9 g/dL (ref 31.5–36.0)
MCV: 98.8 fL (ref 79.5–101.0)
MONO ABS: 0.9 10*3/uL (ref 0.1–0.9)
Monocytes Relative: 13 %
Neutro Abs: 5 10*3/uL (ref 1.5–6.5)
Neutrophils Relative %: 74 %
Platelet Count: 241 10*3/uL (ref 145–400)
RBC: 4.34 MIL/uL (ref 3.70–5.45)
RDW: 13.8 % (ref 11.2–14.5)
WBC: 6.7 10*3/uL (ref 3.9–10.3)

## 2017-09-11 MED ORDER — ACETAMINOPHEN 325 MG PO TABS
650.0000 mg | ORAL_TABLET | Freq: Once | ORAL | Status: AC
  Administered 2017-09-11: 650 mg via ORAL

## 2017-09-11 MED ORDER — DIPHENHYDRAMINE HCL 25 MG PO CAPS
ORAL_CAPSULE | ORAL | Status: AC
  Filled 2017-09-11: qty 1

## 2017-09-11 MED ORDER — SODIUM CHLORIDE 0.9 % IV SOLN
Freq: Once | INTRAVENOUS | Status: AC
  Administered 2017-09-11: 14:00:00 via INTRAVENOUS
  Filled 2017-09-11: qty 250

## 2017-09-11 MED ORDER — DIPHENHYDRAMINE HCL 25 MG PO CAPS
25.0000 mg | ORAL_CAPSULE | Freq: Once | ORAL | Status: AC
  Administered 2017-09-11: 25 mg via ORAL

## 2017-09-11 MED ORDER — ACETAMINOPHEN 325 MG PO TABS
ORAL_TABLET | ORAL | Status: AC
  Filled 2017-09-11: qty 2

## 2017-09-11 MED ORDER — TRASTUZUMAB CHEMO 150 MG IV SOLR
6.0000 mg/kg | Freq: Once | INTRAVENOUS | Status: AC
  Administered 2017-09-11: 357 mg via INTRAVENOUS
  Filled 2017-09-11: qty 17

## 2017-09-11 NOTE — Assessment & Plan Note (Addendum)
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 adjuvant radiation 10/31/16-11/28/16 3. antiestrogen therapy with anastrozole started 01/17/17 ---------------------------------------------------------------------------- Current treatment: Herceptin every 3 weeks/Anastrozole Echo 09/04/17: EF 55-60%, follows with Dr. 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 Lamonte Sakai.  Anastrozole Toxicities:mild hot flashes  Anna York is doing well today. I reviewed her CBC with her in detail (CMET pending).  She will proceed with Herceptin today.  This is her final dose.  Her echocardiogram was done on 7/17 and was normal.  She did f/u with Dr. Aundra Dubin as well.  She will continue on Anastrozole daily.  She had her last breast exam with Dr. Joylene Draft last week.  We did send in a request for a copy of her most recent bone density report.   Arlis will return in December, 2019 for labs and f/u with Dr. Lindi Adie.

## 2017-09-11 NOTE — Progress Notes (Signed)
[facility] Follow up:    Anna Infante, MD [place] [address]   DIAGNOSIS: Cancer Staging Malignant neoplasm of upper-outer quadrant of right breast in female, estrogen receptor positive ([place]) Staging form: Breast, AJCC 8th Edition - Clinical stage from 07/25/2016: Stage 0 (cTis (DCIS), cN0, cM0, ER: Positive, PR: Positive) - Unsigned - Pathologic: Stage Unknown (pT1c, pNX, cM0, G1, ER: Positive, PR: Positive, HER2: Positive) - Signed by Nicholas Lose, MD on 08/23/2016   SUMMARY OF ONCOLOGIC HISTORY:   Malignant neoplasm of upper-outer quadrant of right breast in female, estrogen receptor positive ([place])   1991 Initial Biopsy    Left Breast cancer treated with breast conserving surgery followed by chemotherapy and radiation, unknown receptor status and unknown chemotherapy regimen      07/18/2016 Initial Diagnosis    Right breast calcifications 4 mm size, biopsy revealed DCIS with calcifications low intermediate grade ER 100%, PR 50%, Tis Nx stage 0      08/09/2016 Surgery    Right lumpectomy: 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 female returns for evaluation prior to receiving her final dose of herceptin.  She is doing well today.  She continues on Anastrozole daily and is tolerating it well. She notes mild hot flashes, but no other issues with it.  She tells me that she underwent bone density testing last week with Dr. Joylene Draft.  We do not have those results.      Patient Active Problem List   Diagnosis Date Noted  . Dyspnea on exertion 11/12/2016  . Malignant neoplasm of upper-outer quadrant of right breast in female, estrogen receptor positive ([place]) 07/25/2016  . Palate abnormality 05/17/2015  . COPD (chronic obstructive pulmonary disease) ([place]) 10/04/2011  . Atrophic vaginitis   . Osteoporosis   . ASCUS (atypical squamous cells of undetermined significance) on Pap smear   . Cancer ([place])   . COLITIS 01/05/2010  . DIARRHEA 11/01/2009  . TIA 10/28/2009  . CONSTIPATION 10/28/2009  . CYSTITIS 10/28/2009  . OSTEOARTHRITIS 10/28/2009  . BACK PAIN 10/28/2009  . OSTEOPENIA 10/28/2009  . HEADACHE 10/28/2009  . PALPITATIONS 10/28/2009  . OTHER DYSPHAGIA 10/28/2009  . NEOPLASM, MALIGNANT, BREAST, HX OF 10/28/2009  . COLONIC POLYPS, HYPERPLASTIC, HX OF 10/28/2009    is allergic to erythromycin and macrobid [nitrofurantoin].  MEDICAL HISTORY: Past Medical History:  Diagnosis Date  . ASCUS (atypical squamous cells of undetermined significance) on Pap smear    Neg high risk HPV  . Asthma   . Atrophic vaginitis   . Backache, unspecified   . Breast cancer ([place]) 1992   Chemo and Radiation  . Constipation   . COPD (chronic obstructive pulmonary disease) ([place])   . Cystitis, unspecified   . Diverticulosis   . Dysphagia   . GERD (gastroesophageal reflux disease)    indigestion  . Headache(784.0)   . Lymphocytic colitis   .  Osteoarthritis   . Osteopenia 2012   T score -2.1  . Osteoporosis   . Palpitations   . Personal history of colonic polyps 07/07/2002   hyperplastic  . Personal history of malignant neoplasm of breast   . Pneumonia   . Stroke [facility]) 2010   2 TIA's   . TIA (transient ischemic attack)   . Unspecified transient cerebral ischemia     SURGICAL HISTORY: Past Surgical History:  Procedure Laterality Date  . BREAST BIOPSY    . BREAST LUMPECTOMY     chemo and radiation  . BREAST LUMPECTOMY WITH RADIOACTIVE SEED  LOCALIZATION Right 08/09/2016   Procedure: RIGHT BREAST LUMPECTOMY WITH RADIOACTIVE SEED LOCALIZATION;  Surgeon: [name], MD;  Location: [facility];  Service: General;  Laterality: Right;  . CARDIAC CATHETERIZATION    . CATARACT EXTRACTION    . COLONOSCOPY  2011   diverticulosis  . COLPOSCOPY    . FACIAL COSMETIC SURGERY    . LYMPHADENECTOMY  1993  . TONSILLECTOMY      SOCIAL HISTORY: Social History   Socioeconomic History  . Marital status: Married    Spouse name: Not on file  . Number of children: 1  . Years of education: Not on file  . Highest education level: Not on file  Occupational History  . 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 Financial resource strain: Not on file  . Food insecurity:    Worry: Not on file    Inability: Not on file  . Transportation needs:    Medical: Not on file    Non-medical: Not on file  Tobacco Use  . Smoking status: Former Smoker    Types: Cigarettes    Last attempt to quit: 02/20/2000    Years since quitting: 17.5  . Smokeless tobacco: Never Used  Substance and Sexual Activity  . Alcohol use: Yes    Alcohol/week: 8.4 oz    Types: 14 Glasses of wine per week    Comment: daily  . Drug use: No  . Sexual activity: Not Currently    Birth control/protection: Post-menopausal    Comment: 1st intercourse 77 yo-Fewer than 5 partners  Lifestyle  . Physical activity:    Days per week: Not on file    Minutes per session: Not on file  . Stress: Not on file  Relationships  . Social connections:    Talks on phone: Not on file    Gets together: Not on file    Attends religious service: Not on file    Active member of club or organization: Not on file    Attends meetings of clubs or organizations: Not on file    Relationship status: Not on file  . Intimate partner violence:    Fear of current or ex partner: Not on file    Emotionally abused: Not on file    Physically abused: Not on file     Forced sexual activity: Not on file  Other Topics Concern  . Not on file  Social History Narrative   Married to Dr Ethelle Lyon    FAMILY HISTORY: Family History  Problem Relation Age of Onset  . Breast cancer Mother        Age 32  . Hypertension Father     Review of Systems  Constitutional: Negative for appetite change, chills, fatigue, fever and unexpected weight change.  HENT:   Negative for hearing loss, lump/mass, sore throat  and trouble swallowing.   Eyes: Negative for eye problems and icterus.  Respiratory: Positive for shortness of breath (has copd ). Negative for chest tightness and cough.   Cardiovascular: Negative for chest pain, leg swelling and palpitations.  Gastrointestinal: Negative for abdominal distention, abdominal pain, constipation, diarrhea, nausea and vomiting.  Endocrine: Negative for hot flashes.  Skin: Negative for itching and rash.  Neurological: Negative for dizziness, extremity weakness, headaches and numbness.  Hematological: Negative for adenopathy. Does not bruise/bleed easily.  Psychiatric/Behavioral: Negative for depression. The patient is not nervous/anxious.       PHYSICAL EXAMINATION  ECOG PERFORMANCE STATUS: 1 - Symptomatic but completely ambulatory  Vitals:   09/11/17 1120  BP: (!) 170/68  Pulse: 82  Resp: 16  Temp: 98 F (36.7 C)  SpO2: 94%    Physical Exam  Constitutional: She is oriented to person, place, and time. She appears well-developed and well-nourished.  HENT:  Head: Normocephalic and atraumatic.  Mouth/Throat: Oropharynx is clear and moist.  Eyes: Pupils are equal, round, and reactive to light. No scleral icterus.  Neck: Neck supple.  Cardiovascular: Normal rate, regular rhythm and normal heart sounds.  Pulmonary/Chest: Effort normal and breath sounds normal.  Abdominal: Soft. Bowel sounds are normal.  Musculoskeletal: She exhibits no edema.  Lymphadenopathy:    She has no cervical adenopathy.   Neurological: She is alert and oriented to person, place, and time.  Skin: Skin is warm and dry. Capillary refill takes less than 2 seconds. No rash noted.  Psychiatric: She has a normal mood and affect.    LABORATORY DATA:  CBC    Component Value Date/Time   WBC 6.7 09/11/2017 1058   WBC 4.4 01/15/2017 1427   WBC 6.0 08/09/2016 0602   RBC 4.34 09/11/2017 1058   HGB 14.1 09/11/2017 1058   HGB 13.5 01/15/2017 1427   HCT 42.9 09/11/2017 1058   HCT 40.9 01/15/2017 1427   PLT 241 09/11/2017 1058   PLT 232 01/15/2017 1427   MCV 98.8 09/11/2017 1058   MCV 95.1 01/15/2017 1427   MCH 32.5 09/11/2017 1058   MCHC 32.9 09/11/2017 1058   RDW 13.8 09/11/2017 1058   RDW 13.8 01/15/2017 1427   LYMPHSABS 0.7 (L) 09/11/2017 1058   LYMPHSABS 0.5 (L) 01/15/2017 1427   MONOABS 0.9 09/11/2017 1058   MONOABS 0.6 01/15/2017 1427   EOSABS 0.1 09/11/2017 1058   EOSABS 0.0 01/15/2017 1427   BASOSABS 0.1 09/11/2017 1058   BASOSABS 0.0 01/15/2017 1427    CMP     Component Value Date/Time   NA 139 09/11/2017 1058   NA 140 01/15/2017 1427   K 4.3 09/11/2017 1058   K 3.9 01/15/2017 1427   CL 104 09/11/2017 1058   CO2 25 09/11/2017 1058   CO2 20 (L) 01/15/2017 1427   GLUCOSE 90 09/11/2017 1058   GLUCOSE 93 01/15/2017 1427   BUN 15 09/11/2017 1058   BUN 12.7 01/15/2017 1427   CREATININE 0.82 09/11/2017 1058   CREATININE 0.8 01/15/2017 1427   CALCIUM 9.8 09/11/2017 1058   CALCIUM 9.3 01/15/2017 1427   PROT 7.0 09/11/2017 1058   PROT 6.7 01/15/2017 1427   ALBUMIN 4.4 09/11/2017 1058   ALBUMIN 4.0 01/15/2017 1427   AST 19 09/11/2017 1058   AST 20 01/15/2017 1427   ALT 17 09/11/2017 1058   ALT 20 01/15/2017 1427   ALKPHOS 89 09/11/2017 1058   ALKPHOS 64 01/15/2017 1427   BILITOT 0.5 09/11/2017 1058   BILITOT 0.46 01/15/2017  [place] 09/11/2017 1058   GFRAA >60 09/11/2017 1058         ASSESSMENT and PLAN:   Malignant neoplasm of upper-outer quadrant of right breast  in female, estrogen receptor positive ([place]) 08/09/2016 Right lumpectomy: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 adjuvant radiation 10/31/16-11/28/16 3. antiestrogen therapy with anastrozole started 01/17/17 ---------------------------------------------------------------------------- Current treatment: Herceptin every 3 weeks/Anastrozole Echo 09/04/17: EF 55-60%, follows with Dr. 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 Lamonte Sakai.  Anastrozole Toxicities:mild hot flashes  Adrieanna is doing well today. I reviewed her CBC with her in detail (CMET pending).  She will proceed with Herceptin today.  This is her final dose.  Her echocardiogram was done on 7/17 and was normal.  She did f/u with Dr. Aundra Dubin as well.  She will continue on Anastrozole daily.  She had her last breast exam with Dr. Joylene Draft last week.  We did send in a request for a copy of her most recent bone density report.   Jakhiya will return in December, 2019 for labs and f/u with Dr. Lindi Adie.    All questions were answered. The patient knows to call the clinic with any problems, questions or concerns. We can certainly see the patient much sooner if necessary.  A total of (30) minutes of face-to-face time was spent with this patient with greater than 50% of that time in counseling and care-coordination.  This note was electronically signed. Scot Dock, NP 09/11/2017

## 2017-09-11 NOTE — Patient Instructions (Signed)
[facility] Discharge Instructions for Patients Receiving Chemotherapy  Today you received the following chemotherapy agents Herceptin  To help prevent nausea and vomiting after your treatment, we encourage you to take your nausea medication as directed   If you develop nausea and vomiting that is not controlled by your nausea medication, call the clinic.   BELOW ARE SYMPTOMS THAT SHOULD BE REPORTED IMMEDIATELY:  *FEVER GREATER THAN 100.5 F  *CHILLS WITH OR WITHOUT FEVER  NAUSEA AND VOMITING THAT IS NOT CONTROLLED WITH YOUR NAUSEA MEDICATION  *UNUSUAL SHORTNESS OF BREATH  *UNUSUAL BRUISING OR BLEEDING  TENDERNESS IN MOUTH AND THROAT WITH OR WITHOUT PRESENCE OF ULCERS  *URINARY PROBLEMS  *BOWEL PROBLEMS  UNUSUAL RASH Items with * indicate a potential emergency and should be followed up as soon as possible.  Feel free to call the clinic should you have any questions or concerns. The clinic phone number is [phone].  Please show the CHEMO ALERT CARD at check-in to the Emergency Department and triage nurse.   

## 2017-09-12 ENCOUNTER — Telehealth: Payer: Self-pay | Admitting: Hematology

## 2017-09-12 NOTE — Telephone Encounter (Signed)
No LOS 7/24

## 2017-10-14 DIAGNOSIS — R229 Localized swelling, mass and lump, unspecified: Secondary | ICD-10-CM | POA: Diagnosis not present

## 2017-10-16 ENCOUNTER — Telehealth: Payer: Self-pay | Admitting: Internal Medicine

## 2017-10-16 MED ORDER — MESALAMINE 1.2 G PO TBEC: 2.4000 g | DELAYED_RELEASE_TABLET | Freq: Every day | ORAL | 3 refills | Status: DC

## 2017-10-16 MED ORDER — DIPHENOXYLATE-ATROPINE 2.5-0.025 MG PO TABS: 1.0000 | ORAL_TABLET | Freq: Four times a day (QID) | ORAL | 0 refills | Status: DC | PRN

## 2017-10-16 NOTE — Telephone Encounter (Signed)
Spoke with pts husband and he is aware, scripts sent to pharmacy.

## 2017-10-16 NOTE — Telephone Encounter (Signed)
History of lymphocytic colitis, most recently maintained on mesalamine though I understand she has been off for some time Lialda would not be first-line therapy for microscopic colitis but can improve symptoms Okay to resume Lialda 2.4 g daily until follow-up but if no benefit after 1 to 2 weeks would have her let me know because we would then try budesonide  Okay to also provide Lomotil 1 tablet 4 times daily as needed for loose stools and diarrhea until Lialda can become effective

## 2017-10-16 NOTE — Telephone Encounter (Signed)
Pt states yesterday she started having stomach pain. Reports she is having urgency when she gets up and loose stools. States she has not been on lialda for over a year. Pt is going out of town Friday and is requesting a script of Lialda be sent to pharmacy. Pt has OV scheduled in October to see Dr. Hilarie Fredrickson. Please advise.

## 2017-10-23 ENCOUNTER — Other Ambulatory Visit: Payer: Self-pay | Admitting: Emergency Medicine

## 2017-11-16 DIAGNOSIS — Z23 Encounter for immunization: Secondary | ICD-10-CM | POA: Diagnosis not present

## 2017-12-10 ENCOUNTER — Ambulatory Visit (INDEPENDENT_AMBULATORY_CARE_PROVIDER_SITE_OTHER): Payer: PPO | Admitting: Internal Medicine

## 2017-12-10 ENCOUNTER — Encounter: Payer: Self-pay | Admitting: Internal Medicine

## 2017-12-10 VITALS — BP 130/60 | HR 89 | Ht 65.0 in | Wt 128.0 lb

## 2017-12-10 DIAGNOSIS — R1031 Right lower quadrant pain: Secondary | ICD-10-CM | POA: Diagnosis not present

## 2017-12-10 DIAGNOSIS — R1032 Left lower quadrant pain: Secondary | ICD-10-CM | POA: Diagnosis not present

## 2017-12-10 DIAGNOSIS — K52832 Lymphocytic colitis: Secondary | ICD-10-CM

## 2017-12-10 DIAGNOSIS — K5790 Diverticulosis of intestine, part unspecified, without perforation or abscess without bleeding: Secondary | ICD-10-CM | POA: Diagnosis not present

## 2017-12-10 MED ORDER — MESALAMINE 1.2 G PO TBEC: 1.2000 g | DELAYED_RELEASE_TABLET | Freq: Every day | ORAL | 3 refills | Status: DC

## 2017-12-10 NOTE — Progress Notes (Signed)
Subjective:    Patient ID: Anna York, female    DOB: May 19, 1940, 78 y.o.   MRN: [id number]  HPI Anna York is a 77 year old female with a past medical history of lymphocytic colitis, colonic diverticulosis, history of remote and then recurrent breast cancer, osteoarthritis, osteoporosis here for follow-up.  She is here alone today and was last seen on 01/31/2016.  She called in in late August 2019 with recurrent diarrhea which she now feels was a viral infection.  She did take Lialda 2.4 g daily for 1 month but symptoms resolved fairly quickly.  She states that "I panicked" because I was about to go on vacation.  Recently she reports that bowel movements are "really normal for me".  At times she can feel somewhat constipated and she is not having diarrhea.  There has been no blood in her stool or melena.  She was previously using Benefiber but has not really been using this lately.  What she does experience is very mild nocturnal abdominal dull achiness.  This is not sharp nor cramping.  She notices it at night only and it is not present during the day.  She is not having bowel movements at night.  She does wake up frequently which is not new for her and she feels like she notices it when she wakes up but is not sure that it is why she wakes up.  She denies frequent urination at night.  She does not recall whether Lialda improve this pain.  Again she feels none of this discomfort during the day.  No fevers or chills.  No weight loss.  No change in stool caliber.  We discussed how looking back at my note from December 2017 she mention the exact same discomfort but at that point she was still using Lialda intermittently and the pain seemed to have improved.  She reports today not having remember telling me this but we reviewed the documentation.  Her last colonoscopy was October 2011 which showed left colonic diverticular change but no polyps.  At that time she was biopsied and it  showed lymphocytic colitis.  Review of Systems As per HPI, otherwise negative  Current Medications, Allergies, Past Medical History, Past Surgical History, Family History and Social History were reviewed in [employer] record.     Objective:   Physical Exam BP 130/60   Pulse 89   Ht 5\' 5"  (1.651 m)   Wt 128 lb (58.1 kg)   BMI 21.30 kg/m  Constitutional: Well-developed and well-nourished. No distress. HEENT: Normocephalic and atraumatic.  Conjunctivae are normal.  No scleral icterus. Neck: Neck supple. Trachea midline. Cardiovascular: Normal rate, regular rhythm and intact distal pulses.  Pulmonary/chest: Effort normal and breath sounds normal. No wheezing, rales or rhonchi. Abdominal: Soft, nontender, nondistended. Bowel sounds active throughout. There are no masses palpable. No hepatosplenomegaly. Extremities: no clubbing, cyanosis, or edema Neurological: Alert and oriented to person place and time. Skin: Skin is warm and dry. Psychiatric: Normal mood and affect. Behavior is normal.      Assessment & Plan:  77 year old female with a past medical history of lymphocytic colitis, colonic diverticulosis, history of remote and then recurrent breast cancer, osteoarthritis, osteoporosis here for follow-up.   1.  Achy lower abdominal pain at night --I am uncertain what causes this mild discomfort but on review of records it has been present for nearly 2 years.  It also is not present in the daytime and I do not hear any  alarm symptoms.  Without diarrhea I doubt this is lymphocytic colitis but could possibly be symptomatic diverticular disease?  I recommended that she resume Lialda 1.2 g daily to see if this improves this nighttime pain.  She will try this for a month and let me know.  I also recommended that she resume her Benefiber 1 tablespoon daily even her history of mild constipation.  2.  Lymphocytic colitis --I feel that she is in remission given that diarrhea  is not present.  See #1  3.  Colon cancer screening --she is average risk from a colon cancer and polyp standpoint.  Last complete colonoscopy was 8 years ago with no polyps.  She would not be due for screening until 2021.  However we did discuss repeating the test earlier given her overall good health at present and the above-mentioned symptoms.  For now she wishes to not pursue colonoscopy but we will discuss this again at follow-up.  I recommended coming back in 6 months for continuity and to follow-up with me.  25 minutes spent with the patient today. Greater than 50% was spent in counseling and coordination of care with the patient

## 2017-12-10 NOTE — Patient Instructions (Addendum)
We have sent the following medications to your pharmacy for you to pick up at your convenience: Lialda 1.2 g daily  Please purchase the following medications over the counter and take as directed: Benefiber 1 tablespoon daily  Please follow up with Dr Hilarie Fredrickson in 6 months.  We discussed colonoscopy today and decided to wait, however, we will revisit this when you come to the office again.  If you are age 76 or older, your body mass index should be between 23-30. Your Body mass index is 21.3 kg/m. If this is out of the aforementioned range listed, please consider follow up with your Primary Care Provider.  If you are age 99 or younger, your body mass index should be between 19-25. Your Body mass index is 21.3 kg/m. If this is out of the aformentioned range listed, please consider follow up with your Primary Care Provider.

## 2018-01-20 ENCOUNTER — Other Ambulatory Visit: Payer: Self-pay

## 2018-01-20 DIAGNOSIS — Z853 Personal history of malignant neoplasm of breast: Secondary | ICD-10-CM

## 2018-01-20 NOTE — Progress Notes (Signed)
Patient Care Team: Crist Infante, MD as PCP - General (Internal Medicine) [name], MD as Consulting Physician (General Surgery) Magrinat, [name], MD as Consulting Physician (Oncology) Kyung Rudd, MD as Consulting Physician (Radiation Oncology) Gardenia Phlegm, NP as Nurse Practitioner (Hematology and Oncology)  DIAGNOSIS:    ICD-10-CM   1. Malignant neoplasm of upper-outer quadrant of right breast in female, estrogen receptor positive ([place]) C50.411 anastrozole (ARIMIDEX) 1 MG tablet   Z17.0     SUMMARY OF ONCOLOGIC HISTORY:   Malignant neoplasm of upper-outer quadrant of right breast in female, estrogen receptor positive ([place])   1991 Initial Biopsy    Left Breast cancer treated with breast conserving surgery followed by chemotherapy and radiation, unknown receptor status and unknown chemotherapy regimen    07/18/2016 Initial Diagnosis    Right breast calcifications 4 mm size, biopsy revealed DCIS with calcifications low intermediate grade ER 100%, PR 50%, Tis Nx stage 0    08/09/2016 Surgery    Right lumpectomy: 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 with above-mentioned history of right breast cancer treated with lumpectomy and radiation followed by Herceptin maintenance, which she finished on 09/11/17, and anastrozole therapy. I last saw the patient 6 months ago. Her most recent ECHO on 09/04/17 was normal, showing 55% to 60% ejection fracture. Her most recent bone density scan on  08/19/17 showed a T score of -1.4.  She presents to the clinic today and says she is doing well. She denies any side effects of anastrozole. She denies joint stiffness or aches. She denies any pain or discomfort in her breasts. She notes she has occasional hot flashes at night but she does not attribute them to anastrozole. She notes her breathing is not great, and that she uses an inhaler and and sees a cardiologist and her PCP.   REVIEW OF SYSTEMS:   Constitutional: Denies fevers, chills or abnormal weight loss (+) occasional hot flashes Eyes: Denies blurriness of vision Ears, nose, mouth, throat, and face: Denies mucositis or sore throat Respiratory: Denies cough or wheezes (+) dyspnea Cardiovascular: Denies palpitation, chest discomfort Gastrointestinal:  Denies nausea, heartburn or change in bowel habits Skin: Denies abnormal skin rashes Lymphatics: Denies new lymphadenopathy or easy bruising Neurological:Denies numbness, tingling or new weaknesses Behavioral/Psych: Mood is stable, no new changes  Extremities: No lower extremity edema Breast: denies any pain or lumps or nodules in either breasts All other systems were reviewed with the patient and are negative.  I have reviewed the past medical history, past surgical history, social history and family history with the patient and they are unchanged from previous note.  ALLERGIES:  is allergic to erythromycin and macrobid [nitrofurantoin].  MEDICATIONS:  Current Outpatient Medications  Medication Sig Dispense Refill  . albuterol (PROVENTIL HFA;VENTOLIN HFA) 108 (90 Base) MCG/ACT inhaler Inhale 2 puffs into the lungs every 6 (six) hours as needed. 1 Inhaler 3  . anastrozole (  ARIMIDEX) 1 MG tablet Take 1 tablet (1 mg total) by mouth daily. 90 tablet 3  . Cholecalciferol (VITAMIN D) 2000 units CAPS Take 2,000 Units by mouth daily.    . clopidogrel (PLAVIX) 75 MG tablet Take 75 mg by mouth daily.      . mesalamine (LIALDA) 1.2 g EC tablet  Take 1 tablet (1.2 g total) by mouth daily with breakfast. 30 tablet 3  . metoprolol succinate (TOPROL-XL) 25 MG 24 hr tablet Take 12.5 mg by mouth daily.    Marland Kitchen nystatin (MYCOSTATIN) 100000 UNIT/ML suspension     . omeprazole (PRILOSEC) 20 MG capsule Take 20 mg by mouth daily.    . pravastatin (PRAVACHOL) 20 MG tablet Take 20 mg by mouth 2 (two) times a week.     . SPIRIVA HANDIHALER 18 MCG inhalation capsule INHALE CONTENTS OF ONE CAPSULE ONCE A DAY 30 capsule 2  . SYMBICORT 160-4.5 MCG/ACT inhaler TWO PUFFS TWICE DAILY 10.2 g 5   No current facility-administered medications for this visit.     PHYSICAL EXAMINATION: ECOG PERFORMANCE STATUS: 0 - Asymptomatic  Vitals:   01/21/18 1429  BP: (!) 141/69  Pulse: 95  Resp: 17  Temp: 98.2 F (36.8 C)  SpO2: 92%   Filed Weights   01/21/18 1429  Weight: 129 lb 8 oz (58.7 kg)    GENERAL:alert, no distress and comfortable SKIN: skin color, texture, turgor are normal, no rashes or significant lesions EYES: normal, Conjunctiva are pink and non-injected, sclera clear OROPHARYNX:no exudate, no erythema and lips, buccal mucosa, and tongue normal  NECK: supple, thyroid normal size, non-tender, without nodularity LYMPH:  no palpable lymphadenopathy in the cervical, axillary or inguinal LUNGS: clear to auscultation and percussion with normal breathing effort HEART: regular rate & rhythm and no murmurs and no lower extremity edema ABDOMEN:abdomen soft, non-tender and normal bowel sounds MUSCULOSKELETAL:no cyanosis of digits and no clubbing  NEURO: alert & oriented x 3 with fluent speech, no focal motor/sensory deficits EXTREMITIES: No lower extremity edema   LABORATORY DATA:  I have reviewed the data as listed CMP Latest Ref Rng & Units 01/21/2018 09/11/2017 07/23/2017  Glucose 70 - 99 mg/dL 111(H) 90 99  BUN 8 - 23 mg/dL _0 Creatinine 0.44 - 1.00 mg/dL 0.87 0.82 0.81  Sodium 135 - 145 mmol/L 144 139 138  Potassium 3.5 - 5.1 mmol/L 4.1  4.3 4.0  Chloride 98 - 111 mmol/L 103 104 104  CO2 22 - 32 mmol/L _1 Calcium 8.9 - 10.3 mg/dL 9.7 9.8 9.7  Total Protein 6.5 - 8.1 g/dL 7.1 7.0 6.9  Total Bilirubin 0.3 - 1.2 mg/dL 0.7 0.5 0.6  Alkaline Phos 38 - 126 U/L 92 89 81  AST 15 - 41 U/L _2 ALT 0 - 44 U/L _3 Lab Results  Component Value Date   WBC 5.1 01/21/2018   HGB 13.6 01/21/2018   HCT 42.3 01/21/2018   MCV 99.3 01/21/2018   PLT 240 01/21/2018   NEUTROABS 3.6 01/21/2018    ASSESSMENT & PLAN:  Malignant neoplasm of upper-outer quadrant of right breast in female, estrogen receptor positive ([place]) 08/09/2016 Right lumpectomy: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 adjuvant radiation  10/31/16-11/28/16 3. antiestrogen therapy with anastrozole started 01/17/17 ---------------------------------------------------------------------------- Current treatment:  Anastrozole Echo 09/04/17: EF 55-60%, follows with Dr. 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 Lamonte Sakai.  Anastrozole Toxicities:mild hot flashes  Breast cancer surveillance: 1.  Breast exam 09/11/2017: Benign 2. mammogram will be done in May 2020 3.  Bone density [name] 09/12/2017 T score -1.4: Osteopenia continue with calcium and vitamin D  Return to clinic in 1 year for follow-up  No orders of the defined types were placed in this encounter.  The patient has a good understanding of the overall plan. she agrees with it. she will call with any problems that may develop before the next visit here.  Nicholas Lose, MD 01/21/2018   I, Cloyde Reams Dorshimer, am acting as scribe for Nicholas Lose, MD.  I have reviewed the above documentation for  accuracy and completeness, and I agree with the above.

## 2018-01-21 ENCOUNTER — Telehealth: Payer: Self-pay | Admitting: Hematology and Oncology

## 2018-01-21 ENCOUNTER — Inpatient Hospital Stay (HOSPITAL_BASED_OUTPATIENT_CLINIC_OR_DEPARTMENT_OTHER): Payer: PPO | Admitting: Hematology and Oncology

## 2018-01-21 ENCOUNTER — Inpatient Hospital Stay: Payer: PPO | Attending: Hematology and Oncology

## 2018-01-21 DIAGNOSIS — M8580 Other specified disorders of bone density and structure, unspecified site: Secondary | ICD-10-CM | POA: Diagnosis not present

## 2018-01-21 DIAGNOSIS — Z170 Estrogen receptor positive status [ER+]: Secondary | ICD-10-CM | POA: Insufficient documentation

## 2018-01-21 DIAGNOSIS — J439 Emphysema, unspecified: Secondary | ICD-10-CM | POA: Insufficient documentation

## 2018-01-21 DIAGNOSIS — N951 Menopausal and female climacteric states: Secondary | ICD-10-CM

## 2018-01-21 DIAGNOSIS — Z923 Personal history of irradiation: Secondary | ICD-10-CM | POA: Diagnosis not present

## 2018-01-21 DIAGNOSIS — Z853 Personal history of malignant neoplasm of breast: Secondary | ICD-10-CM | POA: Insufficient documentation

## 2018-01-21 DIAGNOSIS — Z79899 Other long term (current) drug therapy: Secondary | ICD-10-CM | POA: Diagnosis not present

## 2018-01-21 DIAGNOSIS — Z79811 Long term (current) use of aromatase inhibitors: Secondary | ICD-10-CM | POA: Diagnosis not present

## 2018-01-21 DIAGNOSIS — C50911 Malignant neoplasm of unspecified site of right female breast: Secondary | ICD-10-CM

## 2018-01-21 DIAGNOSIS — C50411 Malignant neoplasm of upper-outer quadrant of right female breast: Secondary | ICD-10-CM

## 2018-01-21 DIAGNOSIS — Z9221 Personal history of antineoplastic chemotherapy: Secondary | ICD-10-CM | POA: Diagnosis not present

## 2018-01-21 LAB — CMP (CANCER CENTER ONLY)
ALBUMIN: 4.3 g/dL (ref 3.5–5.0)
ALT: 12 U/L (ref 0–44)
ANION GAP: 15 (ref 5–15)
AST: 17 U/L (ref 15–41)
Alkaline Phosphatase: 92 U/L (ref 38–126)
BUN: 11 mg/dL (ref 8–23)
CO2: 26 mmol/L (ref 22–32)
Calcium: 9.7 mg/dL (ref 8.9–10.3)
Chloride: 103 mmol/L (ref 98–111)
Creatinine: 0.87 mg/dL (ref 0.44–1.00)
GFR, Estimated: >60 mL/min (ref >60)
GLUCOSE: 111 mg/dL — AB (ref 70–99)
POTASSIUM: 4.1 mmol/L (ref 3.5–5.1)
SODIUM: 144 mmol/L (ref 135–145)
Total Bilirubin: 0.7 mg/dL (ref 0.3–1.2)
Total Protein: 7.1 g/dL (ref 6.5–8.1)

## 2018-01-21 LAB — CBC WITH DIFFERENTIAL (CANCER CENTER ONLY)
ABS IMMATURE GRANULOCYTES: 0.02 10*3/uL (ref 0.00–0.07)
BASOS ABS: 0.1 10*3/uL (ref 0.0–0.1)
Basophils Relative: 1 %
Eosinophils Absolute: 0 10*3/uL (ref 0.0–0.5)
Eosinophils Relative: 0 %
HCT: 42.3 % (ref 36.0–46.0)
Hemoglobin: 13.6 g/dL (ref 12.0–15.0)
IMMATURE GRANULOCYTES: 0 %
LYMPHS ABS: 0.7 10*3/uL (ref 0.7–4.0)
Lymphocytes Relative: 14 %
MCH: 31.9 pg (ref 26.0–34.0)
MCHC: 32.2 g/dL (ref 30.0–36.0)
MCV: 99.3 fL (ref 80.0–100.0)
MONOS PCT: 14 %
Monocytes Absolute: 0.7 10*3/uL (ref 0.1–1.0)
NEUTROS ABS: 3.6 10*3/uL (ref 1.7–7.7)
NEUTROS PCT: 71 %
NRBC: 0 % (ref 0.0–0.2)
Platelet Count: 240 10*3/uL (ref 150–400)
RBC: 4.26 MIL/uL (ref 3.87–5.11)
RDW: 13.6 % (ref 11.5–15.5)
WBC Count: 5.1 10*3/uL (ref 4.0–10.5)

## 2018-01-21 MED ORDER — ANASTROZOLE 1 MG PO TABS: 1.0000 mg | ORAL_TABLET | Freq: Every day | ORAL | 3 refills | Status: DC

## 2018-01-21 NOTE — Telephone Encounter (Signed)
Gave patient avs report and appointments for December 2020.  °

## 2018-01-21 NOTE — Assessment & Plan Note (Addendum)
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 adjuvant radiation 10/31/16-11/28/16 3. antiestrogen therapy with anastrozole started 01/17/17 ---------------------------------------------------------------------------- Current treatment:  Anastrozole Echo 09/04/17: EF 55-60%, follows with Dr. 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 Lamonte Sakai.  Anastrozole Toxicities:mild hot flashes  Breast cancer surveillance: 1.  Breast exam 09/11/2017: Benign 2. mammogram will be done in May 2020 3.  Bone density [name] 09/12/2017 T score -1.4: Osteopenia continue with calcium and vitamin D  Return to clinic in 1 year for follow-up

## 2018-01-22 ENCOUNTER — Other Ambulatory Visit: Payer: Self-pay | Admitting: *Deleted

## 2018-01-22 MED ORDER — TIOTROPIUM BROMIDE MONOHYDRATE 18 MCG IN CAPS: ORAL_CAPSULE | RESPIRATORY_TRACT | 0 refills | Status: DC

## 2018-02-26 ENCOUNTER — Encounter: Payer: Self-pay | Admitting: Emergency Medicine

## 2018-02-26 ENCOUNTER — Ambulatory Visit (INDEPENDENT_AMBULATORY_CARE_PROVIDER_SITE_OTHER): Payer: PPO | Admitting: Emergency Medicine

## 2018-02-26 DIAGNOSIS — J449 Chronic obstructive pulmonary disease, unspecified: Secondary | ICD-10-CM

## 2018-02-26 MED ORDER — TIOTROPIUM BROMIDE-OLODATEROL 2.5-2.5 MCG/ACT IN AERS: 2.0000 | INHALATION_SPRAY | Freq: Every day | RESPIRATORY_TRACT | 0 refills | Status: DC

## 2018-02-26 MED ORDER — ALBUTEROL SULFATE HFA 108 (90 BASE) MCG/ACT IN AERS: 2.0000 | INHALATION_SPRAY | Freq: Four times a day (QID) | RESPIRATORY_TRACT | 3 refills | Status: DC | PRN

## 2018-02-26 NOTE — Assessment & Plan Note (Signed)
Overall doing well.  No exacerbations for a few years.  Stable exertional dyspnea.  I would like to do a trial of consolidating her bronchodilators.  I will start Stiolto in place of her Spiriva and Symbicort.  If she shows evidence that she misses the ICS component of her regimen (more exacerbations, etc.) then I will change her to Trelegy in the future.  Please stop Symbicort and Spiriva for now. We will do a trial starting Stiolto 2 puffs once daily.  Please call our office to let us know if you benefit from this medication.  If so then we will continue it through your pharmacy. Please keep your albuterol available to use 2 puffs if needed for shortness of breath, wheezing, chest tightness.  We will give you a new prescription for this. Flu shot up to date Follow with Dr. Lamonte Sakai in 12 months or sooner if you have any problem

## 2018-02-26 NOTE — Progress Notes (Signed)
Patient seen in the office today and instructed on use of Stiolto.  Patient expressed understanding and demonstrated technique.  Anna [facility] 02/26/2018

## 2018-02-26 NOTE — Progress Notes (Signed)
Subjective:    Patient ID: Anna York, female    DOB: September 11, 1940, 78 y.o.   MRN: [id number] HPI 78 yo woman, former smoker (20 pk-yrs), hx breast CA, cerebrovascular disease, ? ASD (hasn't had TEE, TTE on 09/04/11 did not see it, PASP estimated 30-51mHg), HTN. She has been told that she had asthma in the past - seemed to be related to pet hair exposure, paint fumes. On these (rare) occasions, she responded well to albuterol. More recently she has been under eval by Dr Joylene Draft for exertional SOB. She recalls that her exertional tolerance has always been less than her peers, but now she has noticed that she has trouble with stairs, a brisk walk, especially if she is also talking. She reports occasional wheezing.   ROV 03/26/17 --patient follows up today for dyspnea and a history of emphysematous COPD.  She has a history of breast cancer that was treated with radiation therapy and Herceptin.  Based on her increased dyspnea we repeated her pulmonary function testing and performed a CT scan of the chest given the potential risks for pneumonitis in the setting of Herceptin.  The CT scan was done on 03/02/17 and I have reviewed.  There are some emphysematous changes, some postradiation changes in the anterior right lung but there is no evidence of a diffuse pneumonitis or any effects from Herceptin.  Coronary function testing done today was reviewed.  This shows early severe obstruction with an FEV1 of 67% predicted.  No bronchodilator response, hyperinflated lung volumes and a decreased diffusion capacity that does not correct when adjusted for alveolar volume.  She is on Spiriva and Symbicort. Unsure whether she is benefiting.    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  Completed Herceptin in July 2019, remains on anastrozole.  She returns today for standard follow-up.  Has been managed on  Spiriva, Symbicort.  She has albuterol and uses it approximately few times a month.  Her most recent exacerbation was several years ago. She has some exertional SOB, at her baseline. Occasional cough, rare wheeze. She has been using nystatin with some frequency - c/o dry mouth, no white plaques. Underwent CT chest 03/01/2017 which I reviewed and which shows fairly significant emphysematous change, postradiation changes in the anterior right lung, no evidence of pneumonitis or other infiltrate.     PULMONARY FUNCTON TEST 10/18/2011  FVC 2.53  FEV1 1.03  FEV1/FVC 40.7  FVC  % Predicted 86  FEV % Predicted 49  FeF 25-75 .34  FeF 25-75 % Predicted 2.35          Objective:   Physical Exam Vitals:   02/26/18 1353  BP: 140/80  Pulse: 91  SpO2: 93%  Weight: 59.1 kg  Height: 5\' 5"  (1.651 m)   Gen: Pleasant, well-nourished, in no distress,  normal affect  ENT: No lesions, no plaquing or coating of the tongue, there is a small ecchymotic area in the posterior pharynx near the uvula  Neck: No JVD, no stridor  Lungs: No use of accessory muscles, no wheeze   Cardiovascular: RRR, heart sounds normal, no murmur or gallops, no peripheral edema  Musculoskeletal: No deformities, no cyanosis or clubbing  Neuro: alert, non focal  Skin: Warm, no lesions or rashes      Assessment & Plan:  COPD (chronic obstructive pulmonary disease) Overall doing well.  No exacerbations for a few years.  Stable exertional dyspnea.  I would like to do a trial of consolidating her bronchodilators.  I will start Stiolto in place of her Spiriva and Symbicort.  If she shows evidence that she misses the ICS component of her regimen (more exacerbations, etc.) then I will change her to Trelegy in the future.  Please stop Symbicort and Spiriva for now. We will do a trial starting Stiolto 2 puffs once daily.  Please call our office to let us know if you benefit from this medication.  If so then we will continue it  through your pharmacy. Please keep your albuterol available to use 2 puffs if needed for shortness of breath, wheezing, chest tightness.  We will give you a new prescription for this. Flu shot up to date Follow with Dr. Lamonte Sakai in 12 months or sooner if you have any problem  Baltazar Apo, MD, PhD 02/26/2018, 2:16 PM [facility] [phone] or if no answer [phone]

## 2018-02-26 NOTE — Patient Instructions (Addendum)
Please stop Symbicort and Spiriva for now. We will do a trial starting Stiolto 2 puffs once daily.  Please call our office to let us know if you benefit from this medication.  If so then we will continue it through your pharmacy. Please keep your albuterol available to use 2 puffs if needed for shortness of breath, wheezing, chest tightness.  We will give you a new prescription for this. Flu shot up to date Follow with Dr. Lamonte Sakai in 12 months or sooner if you have any problems.

## 2018-02-26 NOTE — Addendum Note (Signed)
Addended by: Valerie Salts on: 02/26/2018 02:48 PM   Modules accepted: Orders

## 2018-03-18 DIAGNOSIS — M5137 Other intervertebral disc degeneration, lumbosacral region: Secondary | ICD-10-CM | POA: Diagnosis not present

## 2018-03-18 DIAGNOSIS — M545 Low back pain: Secondary | ICD-10-CM | POA: Diagnosis not present

## 2018-03-18 DIAGNOSIS — M9903 Segmental and somatic dysfunction of lumbar region: Secondary | ICD-10-CM | POA: Diagnosis not present

## 2018-03-18 DIAGNOSIS — M5441 Lumbago with sciatica, right side: Secondary | ICD-10-CM | POA: Diagnosis not present

## 2018-03-19 ENCOUNTER — Telehealth: Payer: Self-pay | Admitting: Emergency Medicine

## 2018-03-19 DIAGNOSIS — J449 Chronic obstructive pulmonary disease, unspecified: Secondary | ICD-10-CM

## 2018-03-19 NOTE — Telephone Encounter (Signed)
Primary Pulmonologist: RB Last office visit and with whom: 02/24/2018 with RB What do we see them for (pulmonary problems): COPD Last OV assessment/plan:  Prior Versions: 1. Collene Gobble, MD (Physician) at 02/26/2018 2:14 PM - Signed    Please stop Symbicort and Spiriva for now. We will do a trial starting Stiolto 2 puffs once daily.  Please call our office to let us know if you benefit from this medication.  If so then we will continue it through your pharmacy. Please keep your albuterol available to use 2 puffs if needed for shortness of breath, wheezing, chest tightness.  We will give you a new prescription for this. Flu shot up to date Follow with Dr. Lamonte Sakai in 12 months or sooner if you have any problems.      Reason for call: Patient states she tried the Stiolto 2 puffs once daily with samples provided on 02/24/2018. She feels this is not working for her well like the Symbicort and Spiriva did. She would like to request something different if possible. RB please advise. Thank you.

## 2018-03-19 NOTE — Telephone Encounter (Signed)
LMTCB

## 2018-03-19 NOTE — Telephone Encounter (Signed)
Pt is calling back [phone]

## 2018-03-20 MED ORDER — FLUTICASONE-UMECLIDIN-VILANT 100-62.5-25 MCG/INH IN AEPB: 1.0000 | INHALATION_SPRAY | Freq: Every day | RESPIRATORY_TRACT | 0 refills | Status: DC

## 2018-03-20 NOTE — Telephone Encounter (Signed)
Samples of Trelegy taken to front desk.

## 2018-03-20 NOTE — Telephone Encounter (Signed)
Based on her symptoms I think would be reasonable do a trial of Trelegy for 1 month.  She will need to be taught how to do this.  Use samples of possible

## 2018-03-20 NOTE — Telephone Encounter (Signed)
LVMTCB x 1 for patient Samples ready for patient, have been documented in chart.

## 2018-03-27 DIAGNOSIS — M545 Low back pain: Secondary | ICD-10-CM | POA: Diagnosis not present

## 2018-03-27 DIAGNOSIS — M5441 Lumbago with sciatica, right side: Secondary | ICD-10-CM | POA: Diagnosis not present

## 2018-03-27 DIAGNOSIS — M5137 Other intervertebral disc degeneration, lumbosacral region: Secondary | ICD-10-CM | POA: Diagnosis not present

## 2018-03-27 DIAGNOSIS — M9903 Segmental and somatic dysfunction of lumbar region: Secondary | ICD-10-CM | POA: Diagnosis not present

## 2018-03-31 DIAGNOSIS — M5441 Lumbago with sciatica, right side: Secondary | ICD-10-CM | POA: Diagnosis not present

## 2018-03-31 DIAGNOSIS — M9903 Segmental and somatic dysfunction of lumbar region: Secondary | ICD-10-CM | POA: Diagnosis not present

## 2018-03-31 DIAGNOSIS — M545 Low back pain: Secondary | ICD-10-CM | POA: Diagnosis not present

## 2018-03-31 DIAGNOSIS — M5137 Other intervertebral disc degeneration, lumbosacral region: Secondary | ICD-10-CM | POA: Diagnosis not present

## 2018-04-02 DIAGNOSIS — H353132 Nonexudative age-related macular degeneration, bilateral, intermediate dry stage: Secondary | ICD-10-CM | POA: Diagnosis not present

## 2018-04-02 DIAGNOSIS — Z961 Presence of intraocular lens: Secondary | ICD-10-CM | POA: Diagnosis not present

## 2018-04-02 DIAGNOSIS — H2511 Age-related nuclear cataract, right eye: Secondary | ICD-10-CM | POA: Diagnosis not present

## 2018-04-08 DIAGNOSIS — M5441 Lumbago with sciatica, right side: Secondary | ICD-10-CM | POA: Diagnosis not present

## 2018-04-08 DIAGNOSIS — M545 Low back pain: Secondary | ICD-10-CM | POA: Diagnosis not present

## 2018-04-08 DIAGNOSIS — M9903 Segmental and somatic dysfunction of lumbar region: Secondary | ICD-10-CM | POA: Diagnosis not present

## 2018-04-08 DIAGNOSIS — M5137 Other intervertebral disc degeneration, lumbosacral region: Secondary | ICD-10-CM | POA: Diagnosis not present

## 2018-04-14 DIAGNOSIS — M9903 Segmental and somatic dysfunction of lumbar region: Secondary | ICD-10-CM | POA: Diagnosis not present

## 2018-04-14 DIAGNOSIS — M5137 Other intervertebral disc degeneration, lumbosacral region: Secondary | ICD-10-CM | POA: Diagnosis not present

## 2018-04-14 DIAGNOSIS — M545 Low back pain: Secondary | ICD-10-CM | POA: Diagnosis not present

## 2018-04-14 DIAGNOSIS — M5441 Lumbago with sciatica, right side: Secondary | ICD-10-CM | POA: Diagnosis not present

## 2018-04-16 ENCOUNTER — Telehealth: Payer: Self-pay | Admitting: Emergency Medicine

## 2018-04-16 MED ORDER — FLUTICASONE-UMECLIDIN-VILANT 100-62.5-25 MCG/INH IN AEPB: 1.0000 | INHALATION_SPRAY | Freq: Every day | RESPIRATORY_TRACT | 5 refills | Status: DC

## 2018-04-16 NOTE — Telephone Encounter (Signed)
Called and spoke with Patient.  She stated that Trelegy worked very good for her and simple to use.  She requested a prescription to be sent to [facility].  Trelegy prescription sent to requested pharmacy.  Nothing further at this time.  03/19/18 phone note  Based on her symptoms I think would be reasonable do a trial of Trelegy for 1 month.  She will need to be taught how to do this.  Use samples of possible

## 2018-04-21 DIAGNOSIS — M9903 Segmental and somatic dysfunction of lumbar region: Secondary | ICD-10-CM | POA: Diagnosis not present

## 2018-04-21 DIAGNOSIS — M5441 Lumbago with sciatica, right side: Secondary | ICD-10-CM | POA: Diagnosis not present

## 2018-04-21 DIAGNOSIS — M5137 Other intervertebral disc degeneration, lumbosacral region: Secondary | ICD-10-CM | POA: Diagnosis not present

## 2018-04-21 DIAGNOSIS — M545 Low back pain: Secondary | ICD-10-CM | POA: Diagnosis not present

## 2018-04-28 DIAGNOSIS — M5441 Lumbago with sciatica, right side: Secondary | ICD-10-CM | POA: Diagnosis not present

## 2018-04-28 DIAGNOSIS — M5137 Other intervertebral disc degeneration, lumbosacral region: Secondary | ICD-10-CM | POA: Diagnosis not present

## 2018-04-28 DIAGNOSIS — M9903 Segmental and somatic dysfunction of lumbar region: Secondary | ICD-10-CM | POA: Diagnosis not present

## 2018-04-28 DIAGNOSIS — M545 Low back pain: Secondary | ICD-10-CM | POA: Diagnosis not present

## 2018-05-05 DIAGNOSIS — M9903 Segmental and somatic dysfunction of lumbar region: Secondary | ICD-10-CM | POA: Diagnosis not present

## 2018-05-05 DIAGNOSIS — M5137 Other intervertebral disc degeneration, lumbosacral region: Secondary | ICD-10-CM | POA: Diagnosis not present

## 2018-05-05 DIAGNOSIS — M5441 Lumbago with sciatica, right side: Secondary | ICD-10-CM | POA: Diagnosis not present

## 2018-05-05 DIAGNOSIS — M545 Low back pain: Secondary | ICD-10-CM | POA: Diagnosis not present

## 2018-05-27 ENCOUNTER — Other Ambulatory Visit: Payer: Self-pay | Admitting: Gynecology

## 2018-05-28 ENCOUNTER — Telehealth: Payer: Self-pay | Admitting: *Deleted

## 2018-05-28 NOTE — Telephone Encounter (Signed)
patient called because Rx for levofloxacin (LEVAQUIN) 500 MG 1 po daily x 3 days #3, tablet was denied patient said she takes for cystitis.   Okay to send Rx?

## 2018-05-28 NOTE — Telephone Encounter (Signed)
FYI Patient informed with the below,she declined office visit. I asked her to schedule victual visit to discuss since she declined OV., patient verbalized she would do this.  I will send message to appointment desk to schedule as well.

## 2018-05-28 NOTE — Telephone Encounter (Signed)
Anna York please see the below, are you willing to prescribe? Patient reports being in pain due cystitis

## 2018-05-28 NOTE — Telephone Encounter (Signed)
I have never seen this patient and do not see where Levaquin was prescribed per this office. last UA here with several years ago.  Best to check a UA prior to prescribing medication.  Treated for breast cancer last year, has she completed all her treatment?

## 2018-05-29 NOTE — Telephone Encounter (Signed)
The patient sees a number of different physicians.  I do not see where we have prescribed this to her in the past and this may have been prescribed by another physician.  It is an unusual medication for cystitis.  If she is having classic bladder symptoms then we could cover with a different more classic antibiotic such as Septra.  I would like to hear what symptoms she is having though.

## 2018-05-29 NOTE — Telephone Encounter (Signed)
Spoke with patient she states she got medicine from another provider and no longer needs office visit with [place].

## 2018-06-05 ENCOUNTER — Encounter: Payer: PPO | Admitting: Gynecology

## 2018-06-20 DIAGNOSIS — D485 Neoplasm of uncertain behavior of skin: Secondary | ICD-10-CM | POA: Diagnosis not present

## 2018-06-20 DIAGNOSIS — L570 Actinic keratosis: Secondary | ICD-10-CM | POA: Diagnosis not present

## 2018-06-20 DIAGNOSIS — L821 Other seborrheic keratosis: Secondary | ICD-10-CM | POA: Diagnosis not present

## 2018-06-20 DIAGNOSIS — L718 Other rosacea: Secondary | ICD-10-CM | POA: Diagnosis not present

## 2018-06-20 DIAGNOSIS — C44729 Squamous cell carcinoma of skin of left lower limb, including hip: Secondary | ICD-10-CM | POA: Diagnosis not present

## 2018-07-17 DIAGNOSIS — Z853 Personal history of malignant neoplasm of breast: Secondary | ICD-10-CM | POA: Diagnosis not present

## 2018-07-17 DIAGNOSIS — D485 Neoplasm of uncertain behavior of skin: Secondary | ICD-10-CM | POA: Diagnosis not present

## 2018-07-17 DIAGNOSIS — Z85828 Personal history of other malignant neoplasm of skin: Secondary | ICD-10-CM | POA: Diagnosis not present

## 2018-07-17 DIAGNOSIS — Z9889 Other specified postprocedural states: Secondary | ICD-10-CM | POA: Diagnosis not present

## 2018-07-17 DIAGNOSIS — C44729 Squamous cell carcinoma of skin of left lower limb, including hip: Secondary | ICD-10-CM | POA: Diagnosis not present

## 2018-07-18 DIAGNOSIS — M65311 Trigger thumb, right thumb: Secondary | ICD-10-CM | POA: Diagnosis not present

## 2018-07-22 IMAGING — NM NM BONE 3 PHASE
5 series · 10 of 10 positions shown · non-contrast
Comparison: None

Radiographic correlation: MR RIGHT wrist 08/05/2017

CLINICAL DATA: RIGHT wrist pain, question osteochondrosis of carpal
lunate RIGHT wrist, history of breast cancer, history of ganglion
cyst RIGHT wrist

EXAM:
NUCLEAR MEDICINE 3-PHASE BONE SCAN
TECHNIQUE: Radionuclide angiographic images, immediate static blood pool
images, and 3-hour delayed static images were obtained of the
hands/wrists after intravenous injection of radiopharmaceutical.
RADIOPHARMACEUTICALS:  20.1 mCi Cc-IIm MDP IV

[bn 3 phase bone · 2.26mm/px · 1 of 1 slices shown (1 of 5)]
[im 1/1]
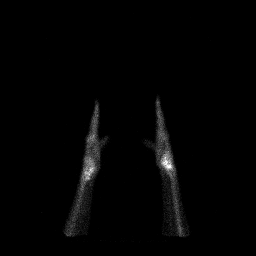

[bn 3 phase bone · 2.26mm/px · 1 of 1 slices shown (2 of 5)]
[im 1/1]
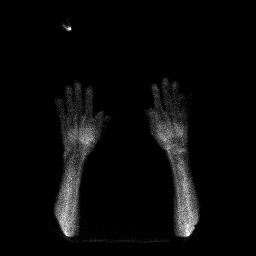

[bn 3 phase bone · 2.26mm/px · 1 of 1 slices shown (3 of 5)]
[im 1/1]
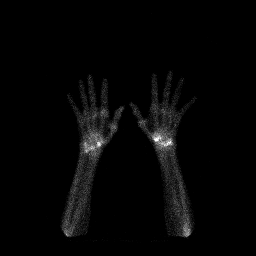

[bn 3 phase bone · 2.26mm/px · 1 of 1 slices shown (4 of 5)]
[im 1/1]
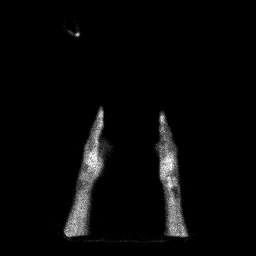

[bn 3 phase bone · 4.52mm/px · 6 of 48 frames shown (5 of 5)]
[frame 5/48]
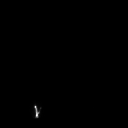
[frame 13/48]
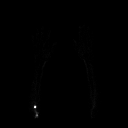
[frame 21/48]
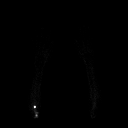
[frame 29/48]
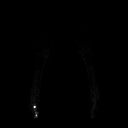
[frame 37/48]
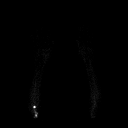
[frame 45/48]
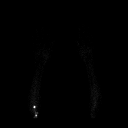

[10 of 10 positions shown; findings below may reference images not displayed]

FINDINGS: Vascular phase: Normal blood flow to BILATERAL hands/wrists

Blood pool phase: Symmetric blood pool at BILATERAL hands/wrists

Delayed phase: Mildly increased tracer localization at the radial
border of the LEFT carpus at the approximate position of the distal
pole of the scaphoid and STT joint. Similar but slightly less
significant increased uptake in RIGHT wrist at same site. Minimal
uptake at the radiocarpal joints bilaterally. No significant focal
abnormal increased osseous tracer accumulation is seen at the RIGHT
lunate. Remaining scintigraphy is unremarkable.
IMPRESSION: No scintigraphic evidence of [place] disease.

Increased uptake at the radial border of the carpus bilaterally at
the STT joint as well as at the radiocarpal joints favoring
degenerative disease.

## 2018-08-27 DIAGNOSIS — C44729 Squamous cell carcinoma of skin of left lower limb, including hip: Secondary | ICD-10-CM | POA: Diagnosis not present

## 2018-08-27 DIAGNOSIS — Z85828 Personal history of other malignant neoplasm of skin: Secondary | ICD-10-CM | POA: Diagnosis not present

## 2018-09-02 ENCOUNTER — Other Ambulatory Visit: Payer: Self-pay

## 2018-09-03 ENCOUNTER — Ambulatory Visit (INDEPENDENT_AMBULATORY_CARE_PROVIDER_SITE_OTHER): Payer: PPO | Admitting: Gynecology

## 2018-09-03 ENCOUNTER — Encounter: Payer: Self-pay | Admitting: Gynecology

## 2018-09-03 VITALS — BP 114/70 | Ht 65.0 in | Wt 125.0 lb

## 2018-09-03 DIAGNOSIS — Z853 Personal history of malignant neoplasm of breast: Secondary | ICD-10-CM

## 2018-09-03 DIAGNOSIS — N952 Postmenopausal atrophic vaginitis: Secondary | ICD-10-CM

## 2018-09-03 DIAGNOSIS — Z01411 Encounter for gynecological examination (general) (routine) with abnormal findings: Secondary | ICD-10-CM

## 2018-09-03 DIAGNOSIS — M8580 Other specified disorders of bone density and structure, unspecified site: Secondary | ICD-10-CM

## 2018-09-03 NOTE — Progress Notes (Signed)
    Anna York 05-20-1940 [id number]        78 y.o.  G1P1001 for breast and pelvic exam.  Without gynecologic complaints.  Past medical history,surgical history, problem list, medications, allergies, family history and social history were all reviewed and documented as reviewed in the EPIC chart.  ROS:  Performed with pertinent positives and negatives included in the history, assessment and plan.   Additional significant findings : None   Exam: [name] assistant Vitals:   09/03/18 1424  BP: 114/70  Weight: 125 lb (56.7 kg)  Height: 5\' 5"  (1.651 m)   Body mass index is 20.8 kg/m.  General appearance:  Normal affect, orientation and appearance. Skin: Grossly normal HEENT: Without gross lesions.  No cervical or supraclavicular adenopathy. Thyroid normal.  Lungs:  Clear without wheezing, rales or rhonchi Cardiac: RR, without RMG Abdominal:  Soft, nontender, without masses, guarding, rebound, organomegaly or hernia Breasts:  Examined lying and sitting without masses, retractions, discharge or axillary adenopathy.  Bilateral well-healed lumpectomy scars Pelvic:  Ext, BUS, Vagina: With atrophic changes  Cervix: With atrophic changes  Uterus: Anteverted, normal size, shape and contour, midline and mobile nontender   Adnexa: Without masses or tenderness    Anus and perineum: Normal   Rectovaginal: Normal sphincter tone without palpated masses or tenderness.    Assessment/Plan:  78 y.o. G25P1001 female for breast and pelvic exam  1. Postmenopausal/atrophic genital changes.  No significant menopausal symptoms or any vaginal bleeding. 2. History of bilateral breast cancer.  Actively followed by oncology.  Breast exam is normal today.  Mammography 07/2018.  Continue to follow-up with oncology. 3. Colonoscopy 2011.  Repeat at their recommended interval. 4. Osteopenia.  DEXA 2019 T score -1.4.  Followed by Dr. Haynes Kerns.  Had transiently been on Fosamax but had GI upset.  She will  continue to follow-up with them for bone health management. 5. Pap smear 2018.  No Pap smear done today.  History of LGSIL 2007.  ASCUS/negative high risk HPV 2011.  Normal Pap smears since.  Discussed options to stop screening per current screening guidelines.  Will readdress on an annual basis. 6. Health maintenance.  No routine lab work done as patient does this elsewhere.  Follow-up 1 year, sooner as needed.  [name] MD, 2:53 PM 09/03/2018

## 2018-09-03 NOTE — Patient Instructions (Signed)
Follow-up in 1 year for annual exam, sooner if needed

## 2018-09-25 DIAGNOSIS — M859 Disorder of bone density and structure, unspecified: Secondary | ICD-10-CM | POA: Diagnosis not present

## 2018-09-25 DIAGNOSIS — E7849 Other hyperlipidemia: Secondary | ICD-10-CM | POA: Diagnosis not present

## 2018-09-25 DIAGNOSIS — E538 Deficiency of other specified B group vitamins: Secondary | ICD-10-CM | POA: Diagnosis not present

## 2018-09-26 DIAGNOSIS — I10 Essential (primary) hypertension: Secondary | ICD-10-CM | POA: Diagnosis not present

## 2018-09-26 DIAGNOSIS — R82998 Other abnormal findings in urine: Secondary | ICD-10-CM | POA: Diagnosis not present

## 2018-10-03 DIAGNOSIS — J449 Chronic obstructive pulmonary disease, unspecified: Secondary | ICD-10-CM | POA: Diagnosis not present

## 2018-10-03 DIAGNOSIS — R05 Cough: Secondary | ICD-10-CM | POA: Diagnosis not present

## 2018-10-03 DIAGNOSIS — M8580 Other specified disorders of bone density and structure, unspecified site: Secondary | ICD-10-CM | POA: Diagnosis not present

## 2018-10-03 DIAGNOSIS — Z1331 Encounter for screening for depression: Secondary | ICD-10-CM | POA: Diagnosis not present

## 2018-10-03 DIAGNOSIS — R0602 Shortness of breath: Secondary | ICD-10-CM | POA: Diagnosis not present

## 2018-10-03 DIAGNOSIS — Z0000 Encounter for general adult medical examination without abnormal findings: Secondary | ICD-10-CM | POA: Diagnosis not present

## 2018-10-03 DIAGNOSIS — D539 Nutritional anemia, unspecified: Secondary | ICD-10-CM | POA: Diagnosis not present

## 2018-10-03 DIAGNOSIS — I10 Essential (primary) hypertension: Secondary | ICD-10-CM | POA: Diagnosis not present

## 2018-10-03 DIAGNOSIS — E785 Hyperlipidemia, unspecified: Secondary | ICD-10-CM | POA: Diagnosis not present

## 2018-10-03 DIAGNOSIS — R011 Cardiac murmur, unspecified: Secondary | ICD-10-CM | POA: Diagnosis not present

## 2018-10-03 DIAGNOSIS — E559 Vitamin D deficiency, unspecified: Secondary | ICD-10-CM | POA: Diagnosis not present

## 2018-10-03 DIAGNOSIS — C50919 Malignant neoplasm of unspecified site of unspecified female breast: Secondary | ICD-10-CM | POA: Diagnosis not present

## 2018-10-03 DIAGNOSIS — G459 Transient cerebral ischemic attack, unspecified: Secondary | ICD-10-CM | POA: Diagnosis not present

## 2018-10-23 DIAGNOSIS — Z23 Encounter for immunization: Secondary | ICD-10-CM | POA: Diagnosis not present

## 2018-10-23 DIAGNOSIS — M8589 Other specified disorders of bone density and structure, multiple sites: Secondary | ICD-10-CM | POA: Diagnosis not present

## 2018-10-24 ENCOUNTER — Other Ambulatory Visit: Payer: Self-pay | Admitting: Emergency Medicine

## 2018-10-29 DIAGNOSIS — Z85828 Personal history of other malignant neoplasm of skin: Secondary | ICD-10-CM | POA: Diagnosis not present

## 2018-10-29 DIAGNOSIS — L308 Other specified dermatitis: Secondary | ICD-10-CM | POA: Diagnosis not present

## 2018-11-12 ENCOUNTER — Other Ambulatory Visit: Payer: Self-pay

## 2018-11-12 ENCOUNTER — Ambulatory Visit: Payer: PPO | Admitting: Emergency Medicine

## 2018-11-12 ENCOUNTER — Encounter: Payer: Self-pay | Admitting: Emergency Medicine

## 2018-11-12 DIAGNOSIS — J449 Chronic obstructive pulmonary disease, unspecified: Secondary | ICD-10-CM

## 2018-11-12 MED ORDER — TRELEGY ELLIPTA 100-62.5-25 MCG/INH IN AEPB: 1.0000 | INHALATION_SPRAY | Freq: Every day | RESPIRATORY_TRACT | 11 refills | Status: DC

## 2018-11-12 NOTE — Progress Notes (Signed)
  Subjective:    Patient ID: Anna York, female    DOB: April 08, 1940, 78 y.o.   MRN: [id number] HPI  ROV 11/12/2018 --[name] is 78, follows up today for her history of COPD and emphysema.  She also has a history of breast cancer, hypertension, prior CVA.  She remains on anastrozole.  Following our most recent visit she was tried on Trelegy.  She reports that she likes the Trelegy, especially its ease of use - she prefers it to symbicort and spiriva. She has not had any exacerbations since last time. She is able to exert, still does her daily activities. She does rest with long periods of exertion. She is able to walk - often will use her albuterol with walks.  She has albuterol available, uses it approximately 1-2x a week. No flares since last time. Flu shot and PNA shot up to date.     PULMONARY FUNCTON TEST 10/18/2011  FVC 2.53  FEV1 1.03  FEV1/FVC 40.7  FVC  % Predicted 86  FEV % Predicted 49  FeF 25-75 .34  FeF 25-75 % Predicted 2.35          Objective:   Physical Exam Vitals:   11/12/18 1508  BP: 116/74  Pulse: 90  SpO2: 94%   Gen: Pleasant, well-nourished, in no distress,  normal affect  ENT: No lesions, small hemangioma R posterior pharynx adjacent to uvula  Neck: No JVD, no stridor  Lungs: No use of accessory muscles, no wheeze   Cardiovascular: RRR, heart sounds normal, no murmur or gallops, no peripheral edema  Musculoskeletal: No deformities, no cyanosis or clubbing  Neuro: alert, non focal  Skin: Warm, no lesions or rashes      Assessment & Plan:  COPD (chronic obstructive pulmonary disease) Doing well - stable interval. No exacerbations. Plan to continue same regimen.   Please continue Trelegy 1 inhalation once daily.  Remember to rinse and gargle after using. Keep your albuterol available use 2 puffs if needed for shortness of breath, chest tightness, wheezing.  Continue to use 2 puffs before significant exertion. Flu shot and pneumonia  shot are both up-to-date Follow with Dr. Lamonte Sakai in 12 months or sooner if you have any problems.   Baltazar Apo, MD, PhD 11/12/2018, 3:26 PM [facility] [phone] or if no answer [phone]

## 2018-11-12 NOTE — Patient Instructions (Addendum)
Please continue Trelegy 1 inhalation once daily.  Remember to rinse and gargle after using. Keep your albuterol available use 2 puffs if needed for shortness of breath, chest tightness, wheezing.  Continue to use 2 puffs before significant exertion. Flu shot and pneumonia shot are both up-to-date Follow with Dr. Lamonte Sakai in 12 months or sooner if you have any problems.

## 2018-11-12 NOTE — Assessment & Plan Note (Signed)
Doing well - stable interval. No exacerbations. Plan to continue same regimen.   Please continue Trelegy 1 inhalation once daily.  Remember to rinse and gargle after using. Keep your albuterol available use 2 puffs if needed for shortness of breath, chest tightness, wheezing.  Continue to use 2 puffs before significant exertion. Flu shot and pneumonia shot are both up-to-date Follow with Dr. Lamonte Sakai in 12 months or sooner if you have any problems.

## 2018-11-12 NOTE — Addendum Note (Signed)
Addended by: Desmond Dike C on: 11/12/2018 03:33 PM   Modules accepted: Orders

## 2018-11-27 ENCOUNTER — Encounter: Payer: Self-pay | Admitting: Gynecology

## 2018-12-04 DIAGNOSIS — Z20818 Contact with and (suspected) exposure to other bacterial communicable diseases: Secondary | ICD-10-CM | POA: Diagnosis not present

## 2018-12-04 DIAGNOSIS — J449 Chronic obstructive pulmonary disease, unspecified: Secondary | ICD-10-CM | POA: Diagnosis not present

## 2018-12-04 DIAGNOSIS — R05 Cough: Secondary | ICD-10-CM | POA: Diagnosis not present

## 2018-12-04 DIAGNOSIS — J441 Chronic obstructive pulmonary disease with (acute) exacerbation: Secondary | ICD-10-CM | POA: Diagnosis not present

## 2018-12-04 DIAGNOSIS — J181 Lobar pneumonia, unspecified organism: Secondary | ICD-10-CM | POA: Diagnosis not present

## 2018-12-04 DIAGNOSIS — R509 Fever, unspecified: Secondary | ICD-10-CM | POA: Diagnosis not present

## 2019-01-13 DIAGNOSIS — J181 Lobar pneumonia, unspecified organism: Secondary | ICD-10-CM | POA: Diagnosis not present

## 2019-01-19 NOTE — Progress Notes (Signed)
Patient Care Team: Crist Infante, MD as PCP - General (Internal Medicine) [name], MD as Consulting Physician (General Surgery) Magrinat, [name], MD as Consulting Physician (Oncology) Kyung Rudd, MD as Consulting Physician (Radiation Oncology) Gardenia Phlegm, NP as Nurse Practitioner (Hematology and Oncology)  DIAGNOSIS:    ICD-10-CM   1. Malignant neoplasm of upper-outer quadrant of right breast in female, estrogen receptor positive ([place])  C50.411 anastrozole (ARIMIDEX) 1 MG tablet   Z17.0     SUMMARY OF ONCOLOGIC HISTORY: Oncology History  Malignant neoplasm of upper-outer quadrant of right breast in female, estrogen receptor positive ([place])  1991 Initial Biopsy   Left Breast cancer treated with breast conserving surgery followed by chemotherapy and radiation, unknown receptor status and unknown chemotherapy regimen   07/18/2016 Initial Diagnosis   Right breast calcifications 4 mm size, biopsy revealed DCIS with calcifications low intermediate grade ER 100%, PR 50%, Tis Nx stage 0   08/09/2016 Surgery   Right lumpectomy: 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 with above-mentioned history of right breast cancer treated with lumpectomy, radiation, Herceptin maintenance, and who is currently on anti-estrogen therapy with anastrozole. Mammogram on 07/24/18 showed no evidence of malignancy bilaterally. Bone density scan on 10/28/18 showed osteopenia with a T-score of -1.8.   She was started on Boniva.  She presents to the clinic today for follow-up.  She denies any lumps or nodules in the breast.  REVIEW OF SYSTEMS:   Constitutional: Denies fevers, chills or abnormal weight loss Eyes: Denies blurriness of vision Ears, nose, mouth, throat, and face: Denies mucositis or sore throat Respiratory: Denies cough, dyspnea or wheezes Cardiovascular: Denies palpitation, chest discomfort Gastrointestinal: Denies nausea, heartburn or change in bowel habits Skin: Denies abnormal skin rashes Lymphatics: Denies new lymphadenopathy or easy bruising Neurological: Denies numbness, tingling or new weaknesses Behavioral/Psych: Mood is stable, no new changes  Extremities: No lower extremity edema Breast: denies any pain or lumps or nodules in either breasts All other systems were reviewed with the patient and are negative.  I have reviewed the past medical history, past surgical history, social history and family history with the patient and they are unchanged from previous note.  ALLERGIES:  is allergic to erythromycin and macrobid [nitrofurantoin].  MEDICATIONS:  Current Outpatient Medications  Medication Sig Dispense Refill   albuterol (PROVENTIL HFA;VENTOLIN HFA) 108 (90 Base) MCG/ACT inhaler Inhale 2 puffs into the lungs every 6 (six) hours as needed. 1 Inhaler 3   anastrozole (ARIMIDEX) 1 MG tablet Take 1 tablet (1 mg total) by mouth daily. 90 tablet 3   Cholecalciferol (VITAMIN D) 2000 units CAPS Take 2,000 Units by mouth daily.     clopidogrel (PLAVIX) 75 MG tablet Take 75 mg by mouth daily.       Fluticasone-Umeclidin-Vilant (TRELEGY ELLIPTA) 100-62.5-25 MCG/INH AEPB Inhale 1 puff into the lungs daily. 60 each 11   ibandronate (BONIVA) 150 MG tablet Take 150  by mouth every 30 (thirty) days. Take in the morning with a full glass of water, on an empty stomach, and do not take anything else by mouth or lie down for the next 30 min.    °• metoprolol succinate  (TOPROL-XL) 25 MG 24 hr tablet Take 12.5 mg by mouth daily.    °• nystatin (MYCOSTATIN) 100000 UNIT/ML suspension     °• omeprazole (PRILOSEC) 20 MG capsule Take 20 mg by mouth daily.    ° °No current facility-administered medications for this visit.   ° ° °PHYSICAL EXAMINATION: °ECOG PERFORMANCE STATUS: 0 - Asymptomatic ° °Vitals:  ° 01/20/19 1507  °BP: (!) 163/94  °Pulse: 75  °Resp: 16  °Temp: 98.2 °F (36.8 °C)  °SpO2: 96%  ° °Filed Weights  ° 01/20/19 1507  °Weight: 125 lb 3.2 oz (56.8 kg)  ° ° °GENERAL: alert, no distress and comfortable °SKIN: skin color, texture, turgor are normal, no rashes or significant lesions °EYES: normal, Conjunctiva are pink and non-injected, sclera clear °OROPHARYNX: no exudate, no erythema and lips, buccal mucosa, and tongue normal  °NECK: supple, thyroid normal size, non-tender, without nodularity °LYMPH: no palpable lymphadenopathy in the cervical, axillary or inguinal °LUNGS: clear to auscultation and percussion with normal breathing effort °HEART: regular rate & rhythm and no murmurs and no lower extremity edema °ABDOMEN: abdomen soft, non-tender and normal bowel sounds °MUSCULOSKELETAL: no cyanosis of digits and no clubbing  °NEURO: alert & oriented x 3 with fluent speech, no focal motor/sensory deficits °EXTREMITIES: No lower extremity edema °BREAST: No palpable masses or nodules in either right or left breasts. No palpable axillary supraclavicular or infraclavicular adenopathy no breast tenderness or nipple discharge. (exam performed in the presence of a chaperone) ° °LABORATORY DATA:  °I have reviewed the data as listed °CMP Latest Ref Rng & Units 01/21/2018 09/11/2017 07/23/2017  °Glucose 70 - 99 mg/dL 111(H) 90 99  °BUN 8 - 23 mg/dL 11 15 14  °Creatinine 0.44 - 1.00 mg/dL 0.87 0.82 0.81  °Sodium 135 - 145 mmol/L 144 139 138  °Potassium 3.5 - 5.1 mmol/L 4.1 4.3 4.0  °Chloride 98 - 111 mmol/L 103 104 104  °CO2 22 - 32 mmol/L 26 25 23  °Calcium 8.9 - 10.3 mg/dL 9.7 9.8 9.7  °Total  Protein 6.5 - 8.1 g/dL 7.1 7.0 6.9  °Total Bilirubin 0.3 - 1.2 mg/dL 0.7 0.5 0.6  °Alkaline Phos 38 - 126 U/L 92 89 81  °AST 15 - 41 U/L 17 19 19  °ALT 0 - 44 U/L 12 17 19  ° ° °Lab Results  °Component Value Date  ° WBC 5.1 01/21/2018  ° HGB 13.6 01/21/2018  ° HCT 42.3 01/21/2018  ° MCV 99.3 01/21/2018  ° PLT 240 01/21/2018  ° NEUTROABS 3.6 01/21/2018  ° ° °ASSESSMENT & PLAN:  °Malignant neoplasm of upper-outer quadrant of right breast in female, estrogen receptor positive ([facility]) °08/09/2016 Right lumpectomy: Grade 1 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 adjuvant radiation 10/31/16-11/28/16 °3. antiestrogen therapy with anastrozole started 01/17/17 °---------------------------------------------------------------------------- °Current treatment:  Anastrozole °Echo 09/04/17: EF 55-60%, follows with Dr. McLean °  °Shortness of breath: Related to emphysema.  Follows Dr. Byrum. °  °Anastrozole Toxicities: mild hot flashes °  °Breast cancer surveillance: °1.  Breast exam 01/20/2019: Benign °2. mammogram 07/24/2018: [name]: Benign breast   density category 8 °3.  Bone density [name] 10/28/2018 T score -1.8: Osteopenia continue with calcium and vitamin D, on Boniva °  °Return to clinic in 1 year for follow-up ° ° ° °No orders of the defined types were placed in this encounter. ° °The patient has a good understanding of the overall plan. she agrees with it. she will call with any problems that may develop before the next visit here. ° °, , MD °01/20/2019 ° °I, Molly Dorshimer, am acting as scribe for Dr.  . ° °I have reviewed the above documentation for accuracy and completeness, and I agree with the above. ° ° ° ° ° ° °

## 2019-01-20 ENCOUNTER — Inpatient Hospital Stay: Payer: PPO | Attending: Hematology and Oncology | Admitting: Hematology and Oncology

## 2019-01-20 ENCOUNTER — Other Ambulatory Visit: Payer: Self-pay

## 2019-01-20 DIAGNOSIS — M8580 Other specified disorders of bone density and structure, unspecified site: Secondary | ICD-10-CM | POA: Insufficient documentation

## 2019-01-20 DIAGNOSIS — C50411 Malignant neoplasm of upper-outer quadrant of right female breast: Secondary | ICD-10-CM | POA: Insufficient documentation

## 2019-01-20 DIAGNOSIS — K219 Gastro-esophageal reflux disease without esophagitis: Secondary | ICD-10-CM | POA: Insufficient documentation

## 2019-01-20 DIAGNOSIS — Z923 Personal history of irradiation: Secondary | ICD-10-CM | POA: Diagnosis not present

## 2019-01-20 DIAGNOSIS — J439 Emphysema, unspecified: Secondary | ICD-10-CM | POA: Diagnosis not present

## 2019-01-20 DIAGNOSIS — Z9221 Personal history of antineoplastic chemotherapy: Secondary | ICD-10-CM | POA: Insufficient documentation

## 2019-01-20 DIAGNOSIS — Z79811 Long term (current) use of aromatase inhibitors: Secondary | ICD-10-CM | POA: Insufficient documentation

## 2019-01-20 DIAGNOSIS — Z170 Estrogen receptor positive status [ER+]: Secondary | ICD-10-CM | POA: Diagnosis not present

## 2019-01-20 DIAGNOSIS — Z79899 Other long term (current) drug therapy: Secondary | ICD-10-CM | POA: Insufficient documentation

## 2019-01-20 MED ORDER — ANASTROZOLE 1 MG PO TABS: 1.0000 mg | ORAL_TABLET | Freq: Every day | ORAL | 3 refills | Status: DC

## 2019-01-20 NOTE — Assessment & Plan Note (Signed)
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 adjuvant radiation 10/31/16-11/28/16 3. antiestrogen therapy with anastrozole started 01/17/17 ---------------------------------------------------------------------------- Current treatment:  Anastrozole Echo 09/04/17: EF 55-60%, follows with Dr. 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 Lamonte Sakai.  Anastrozole Toxicities:mild hot flashes  Breast cancer surveillance: 1.  Breast exam 01/20/2019: Benign 2. mammogram 07/24/2018: [name]: Benign breast density category 8 3.  Bone density [name] 10/28/2018 T score -1.5: Osteopenia continue with calcium and vitamin D  Return to clinic in 1 year for follow-up

## 2019-01-21 ENCOUNTER — Telehealth: Payer: Self-pay | Admitting: Hematology and Oncology

## 2019-01-21 NOTE — Telephone Encounter (Signed)
I left a message regarding schedule  

## 2019-03-10 ENCOUNTER — Ambulatory Visit: Payer: PPO | Attending: Internal Medicine

## 2019-03-10 DIAGNOSIS — Z23 Encounter for immunization: Secondary | ICD-10-CM

## 2019-03-10 NOTE — Progress Notes (Signed)
   Covid-19 Vaccination Clinic  Name:  Anna York    MRN: [id number] DOB: [birth year]  03/10/2019  [name] was observed post Covid-19 immunization for 15 minutes without incidence. She was provided with Vaccine Information Sheet and instruction to access the V-Safe system.   [name] was instructed to call 911 with any severe reactions post vaccine: Marland Kitchen Difficulty breathing  . Swelling of your face and throat  . A fast heartbeat  . A bad rash all over your body  . Dizziness and weakness    Immunizations Administered    Name Date Dose VIS Date Route   Pfizer COVID-19 Vaccine 03/10/2019  4:51 PM 0.3 mL 01/30/2019 Intramuscular   Manufacturer: [place]   Lot: [id number]   [place]: [id number]

## 2019-03-28 ENCOUNTER — Ambulatory Visit: Payer: PPO | Attending: Internal Medicine

## 2019-03-28 DIAGNOSIS — Z23 Encounter for immunization: Secondary | ICD-10-CM | POA: Insufficient documentation

## 2019-03-28 NOTE — Progress Notes (Signed)
   Covid-19 Vaccination Clinic  Name:  Anna York    MRN: [id number] DOB: [birth year]  03/28/2019  [name] was observed post Covid-19 immunization for 15 minutes without incidence. She was provided with Vaccine Information Sheet and instruction to access the V-Safe system.   [name] was instructed to call 911 with any severe reactions post vaccine: Marland Kitchen Difficulty breathing  . Swelling of your face and throat  . A fast heartbeat  . A bad rash all over your body  . Dizziness and weakness    Immunizations Administered    Name Date Dose VIS Date Route   Pfizer COVID-19 Vaccine 03/28/2019 11:00 AM 0.3 mL 01/30/2019 Intramuscular   Manufacturer: [place]   Lot: [id number]   [place]: [id number]

## 2019-04-06 DIAGNOSIS — Z961 Presence of intraocular lens: Secondary | ICD-10-CM | POA: Diagnosis not present

## 2019-04-06 DIAGNOSIS — H2511 Age-related nuclear cataract, right eye: Secondary | ICD-10-CM | POA: Diagnosis not present

## 2019-04-06 DIAGNOSIS — H353132 Nonexudative age-related macular degeneration, bilateral, intermediate dry stage: Secondary | ICD-10-CM | POA: Diagnosis not present

## 2019-05-20 DIAGNOSIS — M65331 Trigger finger, right middle finger: Secondary | ICD-10-CM | POA: Diagnosis not present

## 2019-05-20 DIAGNOSIS — M79642 Pain in left hand: Secondary | ICD-10-CM | POA: Diagnosis not present

## 2019-05-20 DIAGNOSIS — M65342 Trigger finger, left ring finger: Secondary | ICD-10-CM | POA: Diagnosis not present

## 2019-05-20 DIAGNOSIS — M79641 Pain in right hand: Secondary | ICD-10-CM | POA: Diagnosis not present

## 2019-05-28 DIAGNOSIS — D1801 Hemangioma of skin and subcutaneous tissue: Secondary | ICD-10-CM | POA: Diagnosis not present

## 2019-05-28 DIAGNOSIS — D692 Other nonthrombocytopenic purpura: Secondary | ICD-10-CM | POA: Diagnosis not present

## 2019-05-28 DIAGNOSIS — Z85828 Personal history of other malignant neoplasm of skin: Secondary | ICD-10-CM | POA: Diagnosis not present

## 2019-05-28 DIAGNOSIS — L821 Other seborrheic keratosis: Secondary | ICD-10-CM | POA: Diagnosis not present

## 2019-05-28 DIAGNOSIS — L720 Epidermal cyst: Secondary | ICD-10-CM | POA: Diagnosis not present

## 2019-05-29 ENCOUNTER — Encounter: Payer: Self-pay | Admitting: Emergency Medicine

## 2019-05-29 ENCOUNTER — Other Ambulatory Visit: Payer: Self-pay

## 2019-05-29 ENCOUNTER — Ambulatory Visit: Payer: PPO | Admitting: Emergency Medicine

## 2019-05-29 DIAGNOSIS — J449 Chronic obstructive pulmonary disease, unspecified: Secondary | ICD-10-CM | POA: Diagnosis not present

## 2019-05-29 MED ORDER — ALBUTEROL SULFATE HFA 108 (90 BASE) MCG/ACT IN AERS: 2.0000 | INHALATION_SPRAY | Freq: Four times a day (QID) | RESPIRATORY_TRACT | 5 refills | Status: DC | PRN

## 2019-05-29 NOTE — Progress Notes (Signed)
  Subjective:    Patient ID: Anna York, female    DOB: 1940-10-06, 79 y.o.   MRN: [id number] HPI  ROV 11/12/2018 --[name] is 21, follows up today for her history of COPD and emphysema.  She also has a history of breast cancer, hypertension, prior CVA.  She remains on anastrozole.  Following our most recent visit she was tried on Trelegy.  She reports that she likes the Trelegy, especially its ease of use - she prefers it to symbicort and spiriva. She has not had any exacerbations since last time. She is able to exert, still does her daily activities. She does rest with long periods of exertion. She is able to walk - often will use her albuterol with walks.  She has albuterol available, uses it approximately 1-2x a week. No flares since last time. Flu shot and PNA shot up to date.   ROV 05/29/19 --this is a follow-up visit for 79 year old woman with a history of emphysematous COPD.  She also has a history of breast cancer (on anastrozole), hypertension, prior stroke.  She is currently managed on Trelegy, has albuterol which she uses quite rarely. She does still have exertional SOB with longer walks. She does not wheeze, has a daily cough, non-productive.   COVID vaccine up to date, flu shot and PNA shots up to date.     PULMONARY FUNCTON TEST 10/18/2011  FVC 2.53  FEV1 1.03  FEV1/FVC 40.7  FVC  % Predicted 86  FEV % Predicted 49  FeF 25-75 .34  FeF 25-75 % Predicted 2.35          Objective:   Physical Exam Vitals:   05/29/19 1408  BP: 128/88  Pulse: 97  SpO2: 96%  Weight: 125 lb (56.7 kg)  Height: 5' 5.5" (1.664 m)   Gen: Pleasant, well-nourished, in no distress,  normal affect  ENT: No lesions, OP clear  Neck: No JVD, no stridor  Lungs: No use of accessory muscles, no wheeze   Cardiovascular: RRR, heart sounds normal, no murmur or gallops, no peripheral edema  Musculoskeletal: No deformities, no cyanosis or clubbing  Neuro: alert, non focal  Skin: Warm, no  lesions or rashes      Assessment & Plan:  COPD (chronic obstructive pulmonary disease) Doing quite well.  Well compensated.  No exacerbations.  Her dyspnea is stable.  We talked today about pretreating her walks, exertion with albuterol.  We will refill it for her today.  Please continue your Trelegy daily. Rinse and gargle after using Keep your albuterol available to use 2 puffs if needed for shortness of breath, chest tightness, wheezing.  You may want to try using 2 puffs before your exercise routine, walks.  We will refill this for you today. COVID-19 vaccine, flu shot, pneumonia shot are all up-to-date. Follow with Dr Lamonte Sakai in 6 months or sooner if you have any problems  Baltazar Apo, MD, PhD 05/29/2019, 2:19 PM [facility] [phone] or if no answer [phone]

## 2019-05-29 NOTE — Patient Instructions (Addendum)
Please continue your Trelegy daily. Rinse and gargle after using Keep your albuterol available to use 2 puffs if needed for shortness of breath, chest tightness, wheezing.  You may want to try using 2 puffs before your exercise routine, walks.  We will refill this for you today. COVID-19 vaccine, flu shot, pneumonia shot are all up-to-date. Follow with Dr Lamonte Sakai in 6 months or sooner if you have any problems

## 2019-05-29 NOTE — Assessment & Plan Note (Signed)
Doing quite well.  Well compensated.  No exacerbations.  Her dyspnea is stable.  We talked today about pretreating her walks, exertion with albuterol.  We will refill it for her today.  Please continue your Trelegy daily. Rinse and gargle after using Keep your albuterol available to use 2 puffs if needed for shortness of breath, chest tightness, wheezing.  You may want to try using 2 puffs before your exercise routine, walks.  We will refill this for you today. COVID-19 vaccine, flu shot, pneumonia shot are all up-to-date. Follow with Dr Lamonte Sakai in 6 months or sooner if you have any problems

## 2019-06-05 ENCOUNTER — Other Ambulatory Visit: Payer: Self-pay

## 2019-06-08 ENCOUNTER — Other Ambulatory Visit: Payer: Self-pay

## 2019-06-08 ENCOUNTER — Ambulatory Visit (INDEPENDENT_AMBULATORY_CARE_PROVIDER_SITE_OTHER): Payer: PPO | Admitting: Obstetrics and Gynecology

## 2019-06-08 NOTE — Progress Notes (Signed)
Encounter canceled not seen by provider.

## 2019-06-22 DIAGNOSIS — M65311 Trigger thumb, right thumb: Secondary | ICD-10-CM | POA: Diagnosis not present

## 2019-06-22 DIAGNOSIS — M65331 Trigger finger, right middle finger: Secondary | ICD-10-CM | POA: Diagnosis not present

## 2019-06-22 DIAGNOSIS — M65342 Trigger finger, left ring finger: Secondary | ICD-10-CM | POA: Diagnosis not present

## 2019-07-21 DIAGNOSIS — Z853 Personal history of malignant neoplasm of breast: Secondary | ICD-10-CM | POA: Diagnosis not present

## 2019-07-21 DIAGNOSIS — R928 Other abnormal and inconclusive findings on diagnostic imaging of breast: Secondary | ICD-10-CM | POA: Diagnosis not present

## 2019-08-03 DIAGNOSIS — M65342 Trigger finger, left ring finger: Secondary | ICD-10-CM | POA: Diagnosis not present

## 2019-09-04 ENCOUNTER — Other Ambulatory Visit: Payer: Self-pay

## 2019-09-04 ENCOUNTER — Ambulatory Visit (INDEPENDENT_AMBULATORY_CARE_PROVIDER_SITE_OTHER): Payer: PPO | Admitting: Obstetrics and Gynecology

## 2019-09-04 ENCOUNTER — Encounter: Payer: Self-pay | Admitting: Obstetrics and Gynecology

## 2019-09-04 VITALS — BP 118/76 | Ht 64.0 in | Wt 125.0 lb

## 2019-09-04 DIAGNOSIS — Z01419 Encounter for gynecological examination (general) (routine) without abnormal findings: Secondary | ICD-10-CM | POA: Diagnosis not present

## 2019-09-04 DIAGNOSIS — Z9289 Personal history of other medical treatment: Secondary | ICD-10-CM

## 2019-09-04 DIAGNOSIS — Z853 Personal history of malignant neoplasm of breast: Secondary | ICD-10-CM

## 2019-09-04 DIAGNOSIS — M8580 Other specified disorders of bone density and structure, unspecified site: Secondary | ICD-10-CM

## 2019-09-04 NOTE — Progress Notes (Signed)
IVANNIA WILLHELM November 07, 1940 [id number]  SUBJECTIVE:  79 y.o. G1P1001 female for annual routine gynecologic exam. She has no gynecologic concerns.  Current Outpatient Medications  Medication Sig Dispense Refill  . albuterol (VENTOLIN HFA) 108 (90 Base) MCG/ACT inhaler Inhale 2 puffs into the lungs every 6 (six) hours as needed. 18 g 5  . anastrozole (ARIMIDEX) 1 MG tablet Take 1 tablet (1 mg total) by mouth daily. 90 tablet 3  . Cholecalciferol (VITAMIN D) 2000 units CAPS Take 2,000 Units by mouth daily.    . clopidogrel (PLAVIX) 75 MG tablet Take 75 mg by mouth daily.      . Fluticasone-Umeclidin-Vilant (TRELEGY ELLIPTA) 100-62.5-25 MCG/INH AEPB Inhale 1 puff into the lungs daily. 60 each 11  . ibandronate (BONIVA) 150 MG tablet Take 150 mg by mouth every 30 (thirty) days. Take in the morning with a full glass of water, on an empty stomach, and do not take anything else by mouth or lie down for the next 30 min.    . metoprolol succinate (TOPROL-XL) 25 MG 24 hr tablet Take 12.5 mg by mouth daily.    Marland Kitchen nystatin (MYCOSTATIN) 100000 UNIT/ML suspension     . omeprazole (PRILOSEC) 20 MG capsule Take 20 mg by mouth daily.     No current facility-administered medications for this visit.   Allergies: Erythromycin and Macrobid [nitrofurantoin]  No LMP recorded. Patient is postmenopausal.  Past medical history,surgical history, problem list, medications, allergies, family history and social history were all reviewed and documented as reviewed in the EPIC chart.  ROS:  Feeling well. No dyspnea or chest pain on exertion.  No abdominal pain, change in bowel habits, black or bloody stools.  No urinary tract symptoms. GYN ROS: no abnormal bleeding, pelvic pain or discharge, no breast pain or new or enlarging lumps on self exam.No neurological complaints.   OBJECTIVE:  Ht 5\' 4"  (1.626 m)   Wt 125 lb (56.7 kg)   BMI 21.46 kg/m  The patient appears well, alert, oriented x 3, in no distress. ENT  normal.  Neck supple. No cervical or supraclavicular adenopathy or thyromegaly.  Lungs are clear, good air entry, no wheezes, rhonchi or rales. S1 and S2 normal, no murmurs, regular rate and rhythm.  Abdomen soft without tenderness, guarding, mass or organomegaly.  Neurological is normal, no focal findings.  BREAST EXAM: breasts appear normal, well-healed bilateral lumpectomy scars, no suspicious masses, no skin or nipple changes or axillary nodes  PELVIC EXAM: VULVA: normal appearing vulva with no masses, tenderness or lesions, atrophic changes, VAGINA: normal appearing vagina with normal color and discharge, no lesions, atrophic changes, CERVIX: normal appearing cervix without discharge or lesions, UTERUS: uterus is normal size, shape, consistency and nontender, ADNEXA: normal adnexa in size, nontender and no masses  Chaperone: [name] present during the examination  ASSESSMENT:  79 y.o. G1P1001 here for annual gynecologic exam  PLAN:   1. Postmenopausal.  No vaginal bleeding.  Minor hot flashes and night sweats being on Arimidex, but manageable. 2. Pap smear 2018.  Prior LGSIL 2007, ASCUS/negative HPV 2011.  Otherwise normal Pap smears 2012 through 2015 and also in 2018.  We discussed option to discontinue screening based on the several normal Pap smears since any previous abnormal and age criteria per the current guidelines, and she is comfortable with stopping screening at this point. 3. History of bilateral breast cancers.  Mammogram 07/2019.  Normal breast exam today.  Actively followed by oncology. 4. Colonoscopy 2011.  Recommended that  she follow up at the recommended interval, she will check with Dr. Joylene Draft for recommendations.  5. Osteopenia.  DEXA 10/2018.  T score -1.5.  Now taking Boniva.  Follows with Dr. Joylene Draft for bone health management.   6. Health maintenance.  No labs today as she normally has these completed elsewhere.  Return annually or sooner, prn.  Joseph Pierini  MD 09/04/19

## 2019-09-21 DIAGNOSIS — H353132 Nonexudative age-related macular degeneration, bilateral, intermediate dry stage: Secondary | ICD-10-CM | POA: Diagnosis not present

## 2019-09-21 DIAGNOSIS — H04123 Dry eye syndrome of bilateral lacrimal glands: Secondary | ICD-10-CM | POA: Diagnosis not present

## 2019-10-27 DIAGNOSIS — E538 Deficiency of other specified B group vitamins: Secondary | ICD-10-CM | POA: Diagnosis not present

## 2019-10-27 DIAGNOSIS — I10 Essential (primary) hypertension: Secondary | ICD-10-CM | POA: Diagnosis not present

## 2019-10-27 DIAGNOSIS — M810 Age-related osteoporosis without current pathological fracture: Secondary | ICD-10-CM | POA: Diagnosis not present

## 2019-10-27 DIAGNOSIS — E785 Hyperlipidemia, unspecified: Secondary | ICD-10-CM | POA: Diagnosis not present

## 2019-10-28 DIAGNOSIS — H524 Presbyopia: Secondary | ICD-10-CM | POA: Diagnosis not present

## 2019-10-28 DIAGNOSIS — H353131 Nonexudative age-related macular degeneration, bilateral, early dry stage: Secondary | ICD-10-CM | POA: Diagnosis not present

## 2019-10-28 DIAGNOSIS — H2511 Age-related nuclear cataract, right eye: Secondary | ICD-10-CM | POA: Diagnosis not present

## 2019-11-02 DIAGNOSIS — R002 Palpitations: Secondary | ICD-10-CM | POA: Diagnosis not present

## 2019-11-02 DIAGNOSIS — Z0000 Encounter for general adult medical examination without abnormal findings: Secondary | ICD-10-CM | POA: Diagnosis not present

## 2019-11-02 DIAGNOSIS — Z23 Encounter for immunization: Secondary | ICD-10-CM | POA: Diagnosis not present

## 2019-11-02 DIAGNOSIS — R82998 Other abnormal findings in urine: Secondary | ICD-10-CM | POA: Diagnosis not present

## 2019-11-02 DIAGNOSIS — G459 Transient cerebral ischemic attack, unspecified: Secondary | ICD-10-CM | POA: Diagnosis not present

## 2019-11-02 DIAGNOSIS — I10 Essential (primary) hypertension: Secondary | ICD-10-CM | POA: Diagnosis not present

## 2019-11-02 DIAGNOSIS — M810 Age-related osteoporosis without current pathological fracture: Secondary | ICD-10-CM | POA: Diagnosis not present

## 2019-11-02 DIAGNOSIS — R0602 Shortness of breath: Secondary | ICD-10-CM | POA: Diagnosis not present

## 2019-11-02 DIAGNOSIS — K219 Gastro-esophageal reflux disease without esophagitis: Secondary | ICD-10-CM | POA: Diagnosis not present

## 2019-11-02 DIAGNOSIS — M1990 Unspecified osteoarthritis, unspecified site: Secondary | ICD-10-CM | POA: Diagnosis not present

## 2019-11-02 DIAGNOSIS — E785 Hyperlipidemia, unspecified: Secondary | ICD-10-CM | POA: Diagnosis not present

## 2019-11-02 DIAGNOSIS — J441 Chronic obstructive pulmonary disease with (acute) exacerbation: Secondary | ICD-10-CM | POA: Diagnosis not present

## 2019-11-02 DIAGNOSIS — E559 Vitamin D deficiency, unspecified: Secondary | ICD-10-CM | POA: Diagnosis not present

## 2019-11-19 ENCOUNTER — Other Ambulatory Visit: Payer: Self-pay | Admitting: Emergency Medicine

## 2019-12-24 DIAGNOSIS — H25811 Combined forms of age-related cataract, right eye: Secondary | ICD-10-CM | POA: Diagnosis not present

## 2019-12-24 DIAGNOSIS — H2511 Age-related nuclear cataract, right eye: Secondary | ICD-10-CM | POA: Diagnosis not present

## 2020-01-19 NOTE — Assessment & Plan Note (Signed)
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 adjuvant radiation 10/31/16-11/28/16 3. antiestrogen therapy with anastrozole started 01/17/17 ---------------------------------------------------------------------------- Current treatment: Anastrozole Echo 09/04/17: EF 55-60%, follows with Dr. 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 Lamonte Sakai.  Anastrozole Toxicities:mild hot flashes  Breast cancer surveillance: 1.Breast exam 01/20/2020: Benign 2.mammogram 07/21/2019: [name]: Benign breast density category 8 3.Bone density [name] 10/28/2018 T score -1.8: Osteopenia continue with calcium and vitamin D, on Boniva  Return to clinic in 1 year for follow-up

## 2020-01-19 NOTE — Progress Notes (Signed)
Patient Care Team: Crist Infante, MD as PCP - General (Internal Medicine) [name], MD as Consulting Physician (General Surgery) Magrinat, [name], MD as Consulting Physician (Oncology) Kyung Rudd, MD as Consulting Physician (Radiation Oncology) Gardenia Phlegm, NP as Nurse Practitioner (Hematology and Oncology)  DIAGNOSIS:    ICD-10-CM   1. Malignant neoplasm of upper-outer quadrant of right breast in female, estrogen receptor positive ([place])  C50.411    Z17.0     SUMMARY OF ONCOLOGIC HISTORY: Oncology History  Malignant neoplasm of upper-outer quadrant of right breast in female, estrogen receptor positive ([place])  1991 Initial Biopsy   Left Breast cancer treated with breast conserving surgery followed by chemotherapy and radiation, unknown receptor status and unknown chemotherapy regimen   07/18/2016 Initial Diagnosis   Right breast calcifications 4 mm size, biopsy revealed DCIS with calcifications low intermediate grade ER 100%, PR 50%, Tis Nx stage 0   08/09/2016 Surgery   Right lumpectomy: 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 with above-mentioned history of right breast cancer treated with lumpectomy, radiation,Herceptin maintenance, and who is currently on anti-estrogen therapy with anastrozole. Mammogram on 07/21/19 showed no evidence of malignancy bilaterally. She presents to the clinic today for follow-up.  ALLERGIES:  is allergic to erythromycin and macrobid  [nitrofurantoin].  MEDICATIONS:  Current Outpatient Medications  Medication Sig Dispense Refill  . albuterol (VENTOLIN HFA) 108 (90 Base) MCG/ACT inhaler Inhale 2 puffs into the lungs every 6 (six) hours as needed. 18 g 5  . anastrozole (ARIMIDEX) 1 MG tablet Take 1 tablet (1 mg total) by mouth daily. 90 tablet 3  . Cholecalciferol (VITAMIN D) 2000 units CAPS Take 2,000 Units by mouth daily.    . clopidogrel (PLAVIX) 75 MG tablet Take 75 mg by mouth daily.      Marland Kitchen ibandronate (BONIVA) 150 MG tablet Take 150 mg by mouth every 30 (thirty) days. Take in the morning with a full glass of water, on an empty stomach, and do not take anything else by mouth or lie down for the next 30 min.    . metoprolol succinate (TOPROL-XL) 25 MG 24 hr tablet Take 12.5 mg by mouth daily.    Marland Kitchen nystatin (MYCOSTATIN) 100000 UNIT/ML suspension     . omeprazole (PRILOSEC) 20 MG capsule Take 20 mg by mouth daily.    . TRELEGY ELLIPTA 100-62.5-25 MCG/INH AEPB INHALE ONE PUFF INTO THE LUNGS DAILY 60 each 11   No current facility-administered medications for this visit.    PHYSICAL EXAMINATION: ECOG PERFORMANCE STATUS: 1 - Symptomatic but completely ambulatory  Vitals:   01/20/20 1443  BP: (!) 176/79  Pulse: 63  Resp: 17  Temp: 98.2 F (36.8 C)  SpO2: 97%   Filed Weights   01/20/20 1443  Weight: 127 lb (57.6 kg)    BREAST: No palpable masses or nodules in either right or left breasts. No palpable axillary supraclavicular or infraclavicular adenopathy no  breast tenderness or nipple discharge. (exam performed in the presence of a chaperone)  LABORATORY DATA:  I have reviewed the data as listed CMP Latest Ref Rng & Units 01/21/2018 09/11/2017 07/23/2017  Glucose 70 - 99 mg/dL 111(H) 90 99  BUN 8 - 23 mg/dL _0 Creatinine 0.44 - 1.00 mg/dL 0.87 0.82 0.81  Sodium 135 - 145 mmol/L 144 139 138  Potassium 3.5 - 5.1 mmol/L 4.1 4.3 4.0  Chloride 98 - 111 mmol/L 103 104 104  CO2 22 - 32 mmol/L _1 Calcium  8.9 - 10.3 mg/dL 9.7 9.8 9.7  Total Protein 6.5 - 8.1 g/dL 7.1 7.0 6.9  Total Bilirubin 0.3 - 1.2 mg/dL 0.7 0.5 0.6  Alkaline Phos 38 - 126 U/L 92 89 81  AST 15 - 41 U/L _2 ALT 0 - 44 U/L _3 Lab Results  Component Value Date   WBC 5.1 01/21/2018   HGB 13.6 01/21/2018   HCT 42.3 01/21/2018   MCV 99.3 01/21/2018   PLT 240 01/21/2018   NEUTROABS 3.6 01/21/2018    ASSESSMENT & PLAN:  Malignant neoplasm of upper-outer quadrant of right breast in female, estrogen receptor positive ([place]) 08/09/2016 Right lumpectomy: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 adjuvant radiation 10/31/16-11/28/16 3. antiestrogen therapy with anastrozole started 01/17/17 ---------------------------------------------------------------------------- Current treatment: Anastrozole Echo 09/04/17: EF 55-60%, follows with Dr. 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 Lamonte Sakai.  Anastrozole Toxicities:mild hot flashes  Breast cancer surveillance: 1.Breast exam 01/20/2020: Benign 2.mammogram 07/21/2019: [name]: Benign breast density category 8 3.Bone density [name] 10/28/2018 T score -1.8: Osteopenia continue with calcium and vitamin D, on Boniva  Return to clinic in 1 year for follow-up   No orders of the defined types were placed in this encounter.  The patient has a good understanding of the overall plan. she agrees with it. she will call with any problems that may develop before the next visit here.  Total time spent: 20 mins including face to face time and time spent for planning, charting and coordination of care  Nicholas Lose, MD 01/20/2020  I, Cloyde Reams Dorshimer, am acting as  scribe for Dr. Nicholas Lose.  I have reviewed the above documentation for accuracy and completeness, and I agree with the above.

## 2020-01-20 ENCOUNTER — Other Ambulatory Visit: Payer: Self-pay

## 2020-01-20 ENCOUNTER — Inpatient Hospital Stay: Payer: PPO | Attending: Hematology and Oncology | Admitting: Hematology and Oncology

## 2020-01-20 DIAGNOSIS — M8580 Other specified disorders of bone density and structure, unspecified site: Secondary | ICD-10-CM | POA: Diagnosis not present

## 2020-01-20 DIAGNOSIS — C50411 Malignant neoplasm of upper-outer quadrant of right female breast: Secondary | ICD-10-CM | POA: Diagnosis not present

## 2020-01-20 DIAGNOSIS — Z923 Personal history of irradiation: Secondary | ICD-10-CM | POA: Insufficient documentation

## 2020-01-20 DIAGNOSIS — J439 Emphysema, unspecified: Secondary | ICD-10-CM | POA: Diagnosis not present

## 2020-01-20 DIAGNOSIS — Z9221 Personal history of antineoplastic chemotherapy: Secondary | ICD-10-CM | POA: Diagnosis not present

## 2020-01-20 DIAGNOSIS — Z79899 Other long term (current) drug therapy: Secondary | ICD-10-CM | POA: Diagnosis not present

## 2020-01-20 DIAGNOSIS — Z79811 Long term (current) use of aromatase inhibitors: Secondary | ICD-10-CM | POA: Insufficient documentation

## 2020-01-20 DIAGNOSIS — Z170 Estrogen receptor positive status [ER+]: Secondary | ICD-10-CM | POA: Insufficient documentation

## 2020-01-20 MED ORDER — ANASTROZOLE 1 MG PO TABS: 1.0000 mg | ORAL_TABLET | Freq: Every day | ORAL | 3 refills | Status: DC

## 2020-02-02 DIAGNOSIS — E785 Hyperlipidemia, unspecified: Secondary | ICD-10-CM | POA: Diagnosis not present

## 2020-06-21 ENCOUNTER — Encounter: Payer: Self-pay | Admitting: Emergency Medicine

## 2020-06-21 ENCOUNTER — Ambulatory Visit: Payer: PPO | Admitting: Emergency Medicine

## 2020-06-21 ENCOUNTER — Other Ambulatory Visit: Payer: Self-pay

## 2020-06-21 DIAGNOSIS — J449 Chronic obstructive pulmonary disease, unspecified: Secondary | ICD-10-CM | POA: Diagnosis not present

## 2020-06-21 MED ORDER — ALBUTEROL SULFATE HFA 108 (90 BASE) MCG/ACT IN AERS: 2.0000 | INHALATION_SPRAY | Freq: Four times a day (QID) | RESPIRATORY_TRACT | 5 refills | Status: DC | PRN

## 2020-06-21 NOTE — Progress Notes (Signed)
Subjective:    Patient ID: Anna York, female    DOB: 1940-06-30, 80 y.o.   MRN: [id number] HPI  ROV 11/12/2018 --[name] is 80, follows up today for her history of COPD and emphysema.  She also has a history of breast cancer, hypertension, prior CVA.  She remains on anastrozole.  Following our most recent visit she was tried on Trelegy.  She reports that she likes the Trelegy, especially its ease of use - she prefers it to symbicort and spiriva. She has not had any exacerbations since last time. She is able to exert, still does her daily activities. She does rest with long periods of exertion. She is able to walk - often will use her albuterol with walks.  She has albuterol available, uses it approximately 1-2x a week. No flares since last time. Flu shot and PNA shot up to date.   ROV 05/29/19 --this is a follow-up visit for 80 year old woman with a history of emphysematous COPD.  She also has a history of breast cancer (on anastrozole), hypertension, prior stroke.  She is currently managed on Trelegy, has albuterol which she uses quite rarely. She does still have exertional SOB with longer walks. She does not wheeze, has a daily cough, non-productive.   COVID vaccine up to date, flu shot and PNA shots up to date.   ROV 06/21/20 --80 year old woman with emphysematous COPD.  She is currently managed on Trelegy has albuterol to use if needed.  Today she reports that she is having a bit more exertional SOB, with walking at fast speeds or climbing stairs especially. She has albuterol, uses few times a week. She is unsure that she is doing correctly. She does get some benefit from it. No cough or wheeze, no CP.  PMH: Breast cancer on anastrozole, hypertension, prior CVA   PULMONARY FUNCTON TEST 10/18/2011  FVC 2.53  FEV1 1.03  FEV1/FVC 40.7  FVC  % Predicted 86  FEV % Predicted 49  FeF 25-75 .34  FeF 25-75 % Predicted 2.35         Objective:   Physical Exam Vitals:   06/21/20 1511   BP: 120/70  Pulse: 93  Temp: (!) 97.5 F (36.4 C)  TempSrc: Temporal  SpO2: 96%  Weight: 122 lb 9.6 oz (55.6 kg)  Height: 5' 5.5" (1.664 m)   Gen: Pleasant, well-nourished, in no distress,  normal affect  ENT: No lesions, OP clear  Neck: No JVD, no stridor  Lungs: No use of accessory muscles, no wheeze   Cardiovascular: RRR, heart sounds normal, no murmur or gallops, no peripheral edema  Musculoskeletal: No deformities, no cyanosis or clubbing  Neuro: alert, non focal  Skin: Warm, no lesions or rashes      Assessment & Plan:  COPD (chronic obstructive pulmonary disease) Severe disease with progressive exertional dyspnea since last visit.  Talk to her today about possibly doing pulmonary rehab.  I think we need to rule out occult desaturation with exertion and we will do so today.  She is benefiting from Trelegy.  She was concerned that she was not doing the albuterol correctly, we discussed proper technique.  I also offered her a spacer to see if she would get better deposition but she is not sure she wants to do that right now.  We will defer.  Walking oximetry today on room air Please continue Trelegy one inhalation once daily. Rinse and gargle  Keep albuterol available to use 2 puffs up to every 4  hours if needed for shortness of breath, chest tightness, wheezing.  We could consider a referral to Pulmonary Rehab to help boost your functional capacity.  Follow with Dr Lamonte Sakai in 6 months or sooner if you have any problems  Baltazar Apo, MD, PhD 06/21/2020, 3:46 PM [facility] [phone] or if no answer [phone]

## 2020-06-21 NOTE — Addendum Note (Signed)
Addended by: Gavin Potters R on: 06/21/2020 04:57 PM   Modules accepted: Orders

## 2020-06-21 NOTE — Patient Instructions (Addendum)
Walking oximetry today on room air Please continue Trelegy one inhalation once daily. Rinse and gargle  Keep albuterol available to use 2 puffs up to every 4 hours if needed for shortness of breath, chest tightness, wheezing.  We could consider a referral to Pulmonary Rehab to help boost your functional capacity.  Follow with Dr Lamonte Sakai in 6 months or sooner if you have any problems

## 2020-06-21 NOTE — Assessment & Plan Note (Signed)
Severe disease with progressive exertional dyspnea since last visit.  Talk to her today about possibly doing pulmonary rehab.  I think we need to rule out occult desaturation with exertion and we will do so today.  She is benefiting from Trelegy.  She was concerned that she was not doing the albuterol correctly, we discussed proper technique.  I also offered her a spacer to see if she would get better deposition but she is not sure she wants to do that right now.  We will defer.  Walking oximetry today on room air Please continue Trelegy one inhalation once daily. Rinse and gargle  Keep albuterol available to use 2 puffs up to every 4 hours if needed for shortness of breath, chest tightness, wheezing.  We could consider a referral to Pulmonary Rehab to help boost your functional capacity.  Follow with Dr Lamonte Sakai in 6 months or sooner if you have any problems

## 2020-06-22 ENCOUNTER — Telehealth: Payer: Self-pay | Admitting: Emergency Medicine

## 2020-06-22 NOTE — Telephone Encounter (Signed)
Called and spoke with pt and she is aware that the rx for the albuterol HFA has been sent to the pharmacy on 05/03.  Nothing further is needed.

## 2020-07-13 DIAGNOSIS — M65331 Trigger finger, right middle finger: Secondary | ICD-10-CM | POA: Diagnosis not present

## 2020-07-21 DIAGNOSIS — Z853 Personal history of malignant neoplasm of breast: Secondary | ICD-10-CM | POA: Diagnosis not present

## 2020-08-12 DIAGNOSIS — M5459 Other low back pain: Secondary | ICD-10-CM | POA: Diagnosis not present

## 2020-08-12 DIAGNOSIS — M1711 Unilateral primary osteoarthritis, right knee: Secondary | ICD-10-CM | POA: Diagnosis not present

## 2020-08-12 DIAGNOSIS — M5136 Other intervertebral disc degeneration, lumbar region: Secondary | ICD-10-CM | POA: Diagnosis not present

## 2020-09-14 DIAGNOSIS — M1711 Unilateral primary osteoarthritis, right knee: Secondary | ICD-10-CM | POA: Diagnosis not present

## 2020-09-14 DIAGNOSIS — M160 Bilateral primary osteoarthritis of hip: Secondary | ICD-10-CM | POA: Diagnosis not present

## 2020-09-14 DIAGNOSIS — M5459 Other low back pain: Secondary | ICD-10-CM | POA: Diagnosis not present

## 2020-09-14 DIAGNOSIS — M4185 Other forms of scoliosis, thoracolumbar region: Secondary | ICD-10-CM | POA: Diagnosis not present

## 2020-11-11 DIAGNOSIS — E559 Vitamin D deficiency, unspecified: Secondary | ICD-10-CM | POA: Diagnosis not present

## 2020-11-11 DIAGNOSIS — E785 Hyperlipidemia, unspecified: Secondary | ICD-10-CM | POA: Diagnosis not present

## 2020-11-15 DIAGNOSIS — J449 Chronic obstructive pulmonary disease, unspecified: Secondary | ICD-10-CM | POA: Diagnosis not present

## 2020-11-15 DIAGNOSIS — Z0000 Encounter for general adult medical examination without abnormal findings: Secondary | ICD-10-CM | POA: Diagnosis not present

## 2020-11-15 DIAGNOSIS — R0602 Shortness of breath: Secondary | ICD-10-CM | POA: Diagnosis not present

## 2020-11-15 DIAGNOSIS — M810 Age-related osteoporosis without current pathological fracture: Secondary | ICD-10-CM | POA: Diagnosis not present

## 2020-11-15 DIAGNOSIS — I10 Essential (primary) hypertension: Secondary | ICD-10-CM | POA: Diagnosis not present

## 2020-11-15 DIAGNOSIS — R197 Diarrhea, unspecified: Secondary | ICD-10-CM | POA: Diagnosis not present

## 2020-11-15 DIAGNOSIS — E785 Hyperlipidemia, unspecified: Secondary | ICD-10-CM | POA: Diagnosis not present

## 2020-11-15 DIAGNOSIS — M1990 Unspecified osteoarthritis, unspecified site: Secondary | ICD-10-CM | POA: Diagnosis not present

## 2020-11-15 DIAGNOSIS — J441 Chronic obstructive pulmonary disease with (acute) exacerbation: Secondary | ICD-10-CM | POA: Diagnosis not present

## 2020-11-15 DIAGNOSIS — Z1331 Encounter for screening for depression: Secondary | ICD-10-CM | POA: Diagnosis not present

## 2020-11-15 DIAGNOSIS — R011 Cardiac murmur, unspecified: Secondary | ICD-10-CM | POA: Diagnosis not present

## 2020-11-15 DIAGNOSIS — G459 Transient cerebral ischemic attack, unspecified: Secondary | ICD-10-CM | POA: Diagnosis not present

## 2020-11-15 DIAGNOSIS — Z1339 Encounter for screening examination for other mental health and behavioral disorders: Secondary | ICD-10-CM | POA: Diagnosis not present

## 2020-11-15 DIAGNOSIS — R82998 Other abnormal findings in urine: Secondary | ICD-10-CM | POA: Diagnosis not present

## 2020-11-15 DIAGNOSIS — Z23 Encounter for immunization: Secondary | ICD-10-CM | POA: Diagnosis not present

## 2020-11-19 DIAGNOSIS — Z23 Encounter for immunization: Secondary | ICD-10-CM | POA: Diagnosis not present

## 2020-11-22 ENCOUNTER — Other Ambulatory Visit: Payer: Self-pay

## 2020-11-22 ENCOUNTER — Ambulatory Visit: Payer: PPO | Admitting: Emergency Medicine

## 2020-11-22 ENCOUNTER — Encounter: Payer: Self-pay | Admitting: Emergency Medicine

## 2020-11-22 DIAGNOSIS — J449 Chronic obstructive pulmonary disease, unspecified: Secondary | ICD-10-CM | POA: Diagnosis not present

## 2020-11-22 MED ORDER — TRELEGY ELLIPTA 100-62.5-25 MCG/INH IN AEPB: 1.0000 | INHALATION_SPRAY | Freq: Every day | RESPIRATORY_TRACT | 0 refills | Status: DC

## 2020-11-22 NOTE — Patient Instructions (Addendum)
Please continue Trelegy once daily.  Rinse and gargle after using. Keep your albuterol available to use 2 puffs when needed for shortness of breath, chest tightness, wheezing. Agree with slowly Increasing your walking and exercise.  This will help your breathing. Continue omeprazole once daily. Flu shot up-to-date, pneumonia shot up-to-date COVID shot is up-to-date, get the new booster this fall Follow with Dr. Lamonte Sakai in 12 months or sooner if you have any problems.

## 2020-11-22 NOTE — Addendum Note (Signed)
Addended by: Gavin Potters R on: 11/22/2020 04:53 PM   Modules accepted: Orders

## 2020-11-22 NOTE — Assessment & Plan Note (Signed)
Please continue Trelegy once daily.  Rinse and gargle after using. Keep your albuterol available to use 2 puffs when needed for shortness of breath, chest tightness, wheezing. Agree with slowly Increasing your walking and exercise.  This will help your breathing. Flu shot up-to-date, pneumonia shot up-to-date COVID shot is up-to-date, get the new booster this fall Follow with Dr. Lamonte Sakai in 12 months or sooner if you have any problems.

## 2020-11-22 NOTE — Progress Notes (Signed)
  Subjective:    Patient ID: Anna York, female    DOB: 1940/05/03, 80 y.o.   MRN: [id number] HPI  ROV 06/21/20 --80 year old woman with emphysematous COPD.  She is currently managed on Trelegy has albuterol to use if needed.  Today she reports that she is having a bit more exertional SOB, with walking at fast speeds or climbing stairs especially. She has albuterol, uses few times a week. She is unsure that she is doing correctly. She does get some benefit from it. No cough or wheeze, no CP.  PMH: Breast cancer on anastrozole, hypertension, prior CVA  ROV 11/22/20 --[name] is 80 and follows up today for her COPD.  She also has a history of breast cancer, hypertension, prior CVA.  We are managing her on Trelegy.  She has albuterol which she uses rarely.  No desaturation on ambulation at her last visit.  She has not been doing her walks and exercise lately, notices a bit quicker SOB w exertion. No wheeze. No cough unless she exerts. No mucous production. No flares.  Flu shot up to date. COVID shot up to date.     PULMONARY FUNCTON TEST 10/18/2011  FVC 2.53  FEV1 1.03  FEV1/FVC 40.7  FVC  % Predicted 86  FEV % Predicted 49  FeF 25-75 .34  FeF 25-75 % Predicted 2.35       Objective:   Physical Exam Vitals:   11/22/20 1450  BP: 112/68  Pulse: 80  Temp: 98.2 F (36.8 C)  TempSrc: Oral  SpO2: 92%  Weight: 121 lb 9.6 oz (55.2 kg)  Height: 5\' 5"  (1.651 m)   Gen: Pleasant, well-nourished, in no distress,  normal affect  ENT: No lesions, OP clear  Neck: No JVD, no stridor  Lungs: No use of accessory muscles, no wheeze   Cardiovascular: RRR, heart sounds normal, no murmur or gallops, no peripheral edema  Musculoskeletal: No deformities, no cyanosis or clubbing  Neuro: alert, non focal  Skin: Warm, no lesions or rashes      Assessment & Plan:  COPD (chronic obstructive pulmonary disease) Please continue Trelegy once daily.  Rinse and gargle after using. Keep your  albuterol available to use 2 puffs when needed for shortness of breath, chest tightness, wheezing. Agree with slowly Increasing your walking and exercise.  This will help your breathing. Flu shot up-to-date, pneumonia shot up-to-date COVID shot is up-to-date, get the new booster this fall Follow with Dr. Lamonte Sakai in 12 months or sooner if you have any problems.   Baltazar Apo, MD, PhD 11/22/2020, 3:09 PM [facility] [phone] or if no answer [phone]

## 2020-11-25 DIAGNOSIS — R300 Dysuria: Secondary | ICD-10-CM | POA: Diagnosis not present

## 2020-12-15 DIAGNOSIS — M810 Age-related osteoporosis without current pathological fracture: Secondary | ICD-10-CM | POA: Diagnosis not present

## 2021-01-04 ENCOUNTER — Other Ambulatory Visit: Payer: Self-pay | Admitting: Emergency Medicine

## 2021-01-09 ENCOUNTER — Other Ambulatory Visit: Payer: Self-pay

## 2021-01-09 ENCOUNTER — Other Ambulatory Visit: Payer: Self-pay | Admitting: Internal Medicine

## 2021-01-09 ENCOUNTER — Ambulatory Visit (HOSPITAL_COMMUNITY)
Admission: RE | Admit: 2021-01-09 | Discharge: 2021-01-09 | Disposition: A | Discharge status: Home / Self Care | Payer: PPO | Source: Ambulatory Visit | Attending: Internal Medicine | Admitting: Internal Medicine

## 2021-01-09 ENCOUNTER — Other Ambulatory Visit (HOSPITAL_COMMUNITY): Payer: Self-pay | Admitting: Internal Medicine

## 2021-01-09 DIAGNOSIS — R109 Unspecified abdominal pain: Secondary | ICD-10-CM | POA: Insufficient documentation

## 2021-01-09 DIAGNOSIS — R197 Diarrhea, unspecified: Secondary | ICD-10-CM

## 2021-01-09 DIAGNOSIS — R1030 Lower abdominal pain, unspecified: Secondary | ICD-10-CM | POA: Diagnosis not present

## 2021-01-09 DIAGNOSIS — K52832 Lymphocytic colitis: Secondary | ICD-10-CM | POA: Diagnosis not present

## 2021-01-09 DIAGNOSIS — N390 Urinary tract infection, site not specified: Secondary | ICD-10-CM | POA: Diagnosis not present

## 2021-01-09 LAB — POCT I-STAT CREATININE: Creatinine, Ser: 0.9 mg/dL (ref 0.44–1.00)

## 2021-01-09 MED ORDER — IOHEXOL 350 MG/ML SOLN
75.0000 mL | Freq: Once | INTRAVENOUS | Status: AC | PRN
  Administered 2021-01-09: 75 mL via INTRAVENOUS

## 2021-01-10 DIAGNOSIS — R109 Unspecified abdominal pain: Secondary | ICD-10-CM | POA: Diagnosis not present

## 2021-01-18 NOTE — Progress Notes (Incomplete)
Patient Care Team: Crist Infante, MD as PCP - General (Internal Medicine) [name], MD as Consulting Physician (General Surgery) Magrinat, [name], MD as Consulting Physician (Oncology) Kyung Rudd, MD as Consulting Physician (Radiation Oncology) Delice Bison Charlestine Massed, NP as Nurse Practitioner (Hematology and Oncology)  DIAGNOSIS: No diagnosis found.  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 with above-mentioned history of right breast cancer treated with lumpectomy, radiation, Herceptin maintenance, and who is currently on antiestrogen therapy with anastrozole. She presents to the clinic today for follow-up.   ALLERGIES:  is allergic to erythromycin and macrobid [nitrofurantoin].  MEDICATIONS:  Current Outpatient Medications  Medication Sig Dispense Refill   albuterol (VENTOLIN HFA) 108 (90 Base) MCG/ACT inhaler Inhale 2 puffs into the lungs every  6 (six) hours as needed. 18 g 5   anastrozole (ARIMIDEX) 1 MG tablet Take 1 tablet (1 mg total) by mouth daily. 90 tablet 3   Cholecalciferol (VITAMIN D) 2000 units CAPS Take 2,000 Units by mouth daily.     clopidogrel (PLAVIX) 75 MG tablet Take 75 mg by mouth daily.     Fluticasone-Umeclidin-Vilant (TRELEGY ELLIPTA) 100-62.5-25 MCG/INH AEPB Inhale 1 puff into the lungs daily. 28 each 0   ibandronate (BONIVA) 150 MG tablet Take 150 mg by mouth every 30 (thirty) days. Take in the morning with a full glass of water, on an empty stomach, and do not take anything else by mouth or lie down for the next 30 min.     metoprolol succinate (TOPROL-XL) 25 MG 24 hr tablet Take 12.5 mg by mouth daily.     nystatin (MYCOSTATIN) 100000 UNIT/ML suspension      omeprazole (PRILOSEC) 20 MG capsule Take 20 mg by mouth daily.     TRELEGY ELLIPTA 100-62.5-25 MCG/ACT AEPB INHALE ONE PUFF INTO THE LUNGS DAILY 60 each 6   TRELEGY ELLIPTA 100-62.5-25 MCG/INH AEPB INHALE ONE PUFF INTO THE LUNGS DAILY 60 each 11   No current facility-administered medications for this visit.    PHYSICAL EXAMINATION: ECOG PERFORMANCE STATUS: {CHL ONC ECOG [phone]}  There were no vitals filed for this visit. There were no vitals filed for this visit.  BREAST:*** No palpable masses or nodules in either right or left breasts. No palpable axillary supraclavicular or infraclavicular adenopathy no breast tenderness or nipple discharge. (exam performed in the presence of a chaperone)  LABORATORY DATA:  I have reviewed the data as listed CMP Latest Ref  Rng & Units 01/09/2021 01/21/2018 09/11/2017  Glucose 70 - 99 mg/dL - 111(H) 90  BUN 8 - 23 mg/dL - 11 15  Creatinine 0.44 - 1.00 mg/dL 0.90 0.87 0.82  Sodium 135 - 145 mmol/L - 144 139  Potassium 3.5 - 5.1 mmol/L - 4.1 4.3  Chloride 98 - 111 mmol/L - 103 104  CO2 22 - 32 mmol/L - 26 25  Calcium 8.9 - 10.3 mg/dL - 9.7 9.8  Total Protein 6.5 - 8.1 g/dL - 7.1 7.0  Total Bilirubin 0.3  - 1.2 mg/dL - 0.7 0.5  Alkaline Phos 38 - 126 U/L - 92 89  AST 15 - 41 U/L - 17 19  ALT 0 - 44 U/L - 12 17    Lab Results  Component Value Date   WBC 5.1 01/21/2018   HGB 13.6 01/21/2018   HCT 42.3 01/21/2018   MCV 99.3 01/21/2018   PLT 240 01/21/2018   NEUTROABS 3.6 01/21/2018    ASSESSMENT & PLAN:  No problem-specific Assessment & Plan notes found for this encounter.    No orders of the defined types were placed in this encounter.  The patient has a good understanding of the overall plan. she agrees with it. she will call with any problems that may develop before the next visit here.  Total time spent: *** mins including face to face time and time spent for planning, charting and coordination of care  Rulon Eisenmenger, MD, MPH 01/18/2021  I, Thana Ates, am acting as scribe for Dr. Nicholas Lose.  {insert scribe attestation}

## 2021-01-19 ENCOUNTER — Inpatient Hospital Stay: Payer: PPO | Attending: Hematology and Oncology | Admitting: Hematology and Oncology

## 2021-01-19 DIAGNOSIS — Z170 Estrogen receptor positive status [ER+]: Secondary | ICD-10-CM | POA: Insufficient documentation

## 2021-01-19 DIAGNOSIS — C50411 Malignant neoplasm of upper-outer quadrant of right female breast: Secondary | ICD-10-CM | POA: Insufficient documentation

## 2021-01-19 DIAGNOSIS — Z79811 Long term (current) use of aromatase inhibitors: Secondary | ICD-10-CM | POA: Insufficient documentation

## 2021-01-19 DIAGNOSIS — Z923 Personal history of irradiation: Secondary | ICD-10-CM | POA: Insufficient documentation

## 2021-01-19 DIAGNOSIS — Z79899 Other long term (current) drug therapy: Secondary | ICD-10-CM | POA: Insufficient documentation

## 2021-01-19 DIAGNOSIS — Z9221 Personal history of antineoplastic chemotherapy: Secondary | ICD-10-CM | POA: Insufficient documentation

## 2021-01-19 NOTE — Assessment & Plan Note (Deleted)
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 adjuvant radiation 10/31/16-11/28/16 3. antiestrogen therapy with anastrozole started 01/17/17 ---------------------------------------------------------------------------- Current treatment: Anastrozole Echo 09/04/17: EF 55-60%, follows with Dr. 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 Lamonte Sakai.  Anastrozole Toxicities:mild hot flashes  Breast cancer surveillance: 1.Breast exam12/02/2019: Benign 2.mammogram6/03/2020: [name]: Benign breast density category A 3.Bone density [name]9/8/2020T score -1.8: Osteopenia continue with calcium and vitamin D,on Boniva  Return to clinic in 1 year for follow-up

## 2021-01-24 ENCOUNTER — Telehealth: Payer: Self-pay | Admitting: Hematology and Oncology

## 2021-01-24 NOTE — Telephone Encounter (Signed)
Scheduled per sch msg. Called and spoke with patient. Confirmed appts  

## 2021-01-25 DIAGNOSIS — H26491 Other secondary cataract, right eye: Secondary | ICD-10-CM | POA: Diagnosis not present

## 2021-01-25 DIAGNOSIS — H5213 Myopia, bilateral: Secondary | ICD-10-CM | POA: Diagnosis not present

## 2021-01-25 DIAGNOSIS — H353122 Nonexudative age-related macular degeneration, left eye, intermediate dry stage: Secondary | ICD-10-CM | POA: Diagnosis not present

## 2021-01-25 DIAGNOSIS — H353111 Nonexudative age-related macular degeneration, right eye, early dry stage: Secondary | ICD-10-CM | POA: Diagnosis not present

## 2021-01-29 NOTE — Assessment & Plan Note (Signed)
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 adjuvant radiation 10/31/16-11/28/16 3. antiestrogen therapy with anastrozole started 01/17/17 ---------------------------------------------------------------------------- Current treatment: Anastrozole Echo 09/04/17: EF 55-60%, follows with Dr. 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 Lamonte Sakai.  Anastrozole Toxicities:mild hot flashes  Breast cancer surveillance: 1.Breast exam12/01/2020: Anna York 2.mammogram6/03/2020: [name]: Benign breast density category 8 3.Bone density [name]9/8/2020T score -1.8: Osteopenia continue with calcium and vitamin D,on Boniva  CT CAP 01/09/21: Negative Return to clinic in 1 year for follow-up

## 2021-01-29 NOTE — Progress Notes (Signed)
Patient Care Team: Crist Infante, MD as PCP - General (Internal Medicine) [name], MD as Consulting Physician (General Surgery) Magrinat, [name], MD as Consulting Physician (Oncology) Kyung Rudd, MD as Consulting Physician (Radiation Oncology) Gardenia Phlegm, NP as Nurse Practitioner (Hematology and Oncology)  DIAGNOSIS:    ICD-10-CM   1. Malignant neoplasm of upper-outer quadrant of right breast in female, estrogen receptor positive ([place])  C50.411    Z17.0       SUMMARY OF ONCOLOGIC HISTORY: Oncology History  Malignant neoplasm of upper-outer quadrant of right breast in female, estrogen receptor positive ([place])  1991 Initial Biopsy   Left Breast cancer treated with breast conserving surgery followed by chemotherapy and radiation, unknown receptor status and unknown chemotherapy regimen   07/18/2016 Initial Diagnosis   Right breast calcifications 4 mm size, biopsy revealed DCIS with calcifications low intermediate grade ER 100%, PR 50%, Tis Nx stage 0   08/09/2016 Surgery   Right lumpectomy: 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 with above-mentioned history of right breast cancer treated with lumpectomy, radiation, Herceptin maintenance, and who is currently on anti-estrogen therapy with anastrozole. Mammogram on 07/21/20 showed no evidence of malignancy bilaterally. She presents to the clinic today for follow-up.  Her major complaint today is severe pain in the right  knee related to loss of cartilage.  She is seeing orthopedics for that.  ALLERGIES:  is allergic to erythromycin and macrobid [nitrofurantoin].  MEDICATIONS:  Current Outpatient Medications  Medication Sig Dispense Refill   albuterol (VENTOLIN HFA) 108 (90 Base) MCG/ACT inhaler Inhale 2 puffs into the lungs every 6 (six) hours as needed. 18 g 5   anastrozole (ARIMIDEX) 1 MG tablet Take 1 tablet (1 mg total) by mouth daily. 90 tablet 3   Cholecalciferol (VITAMIN D) 2000 units CAPS Take 2,000 Units by mouth daily.     clopidogrel (PLAVIX) 75 MG tablet Take 75 mg by mouth daily.     Fluticasone-Umeclidin-Vilant (TRELEGY ELLIPTA) 100-62.5-25 MCG/INH AEPB Inhale 1 puff into the lungs daily. 28 each 0   ibandronate (BONIVA) 150 MG tablet Take 150 mg by mouth every 30 (thirty) days. Take in the morning with a full glass of water, on an empty stomach, and do not take anything else by mouth or lie down for the next 30 min.     metoprolol succinate (TOPROL-XL) 25 MG 24 hr tablet Take 12.5 mg by mouth daily.     nystatin (MYCOSTATIN) 100000 UNIT/ML suspension      omeprazole (PRILOSEC) 20 MG capsule Take 20 mg by mouth daily.     TRELEGY ELLIPTA 100-62.5-25 MCG/ACT AEPB INHALE ONE PUFF INTO THE LUNGS DAILY 60 each 6   TRELEGY ELLIPTA 100-62.5-25 MCG/INH AEPB INHALE ONE PUFF INTO THE LUNGS DAILY 60 each 11   No current facility-administered medications for this visit.    PHYSICAL EXAMINATION: ECOG PERFORMANCE STATUS: 1 - Symptomatic but completely ambulatory  Vitals:  01/30/21 1259  BP: (!) 171/75  Pulse: 93  Resp: 18  Temp: (!) 97.3 F (36.3 C)  SpO2: 95%   Filed Weights   01/30/21 1259  Weight: 118 lb 8 oz (53.8 kg)    BREAST: No palpable masses or nodules in either right or left breasts. No palpable axillary supraclavicular or infraclavicular adenopathy no breast tenderness or nipple discharge. (exam performed in the presence of a chaperone)  LABORATORY DATA:  I have reviewed the  data as listed CMP Latest Ref Rng & Units 01/09/2021 01/21/2018 09/11/2017  Glucose 70 - 99 mg/dL - 111(H) 90  BUN 8 - 23 mg/dL - 11 15  Creatinine 0.44 - 1.00 mg/dL 0.90 0.87 0.82  Sodium 135 - 145 mmol/L - 144 139  Potassium 3.5 - 5.1 mmol/L - 4.1 4.3  Chloride 98 - 111 mmol/L - 103 104  CO2 22 - 32 mmol/L - 26 25  Calcium 8.9 - 10.3 mg/dL - 9.7 9.8  Total Protein 6.5 - 8.1 g/dL - 7.1 7.0  Total Bilirubin 0.3 - 1.2 mg/dL - 0.7 0.5  Alkaline Phos 38 - 126 U/L - 92 89  AST 15 - 41 U/L - 17 19  ALT 0 - 44 U/L - 12 17    Lab Results  Component Value Date   WBC 5.1 01/21/2018   HGB 13.6 01/21/2018   HCT 42.3 01/21/2018   MCV 99.3 01/21/2018   PLT 240 01/21/2018   NEUTROABS 3.6 01/21/2018    ASSESSMENT & PLAN:  Malignant neoplasm of upper-outer quadrant of right breast in female, estrogen receptor positive ([place]) 08/09/2016 Right lumpectomy: Grade 1 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 adjuvant radiation 10/31/16-11/28/16 3. antiestrogen therapy with anastrozole started 01/17/17 ---------------------------------------------------------------------------- Current treatment:  Anastrozole Echo 09/04/17: EF 55-60%, follows with Dr. 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  Follows Dr. Lamonte Sakai.   Anastrozole Toxicities: mild hot flashes She has 1 more year left on anastrozole.  Breast cancer surveillance: 1.  Breast exam 01/31/2020: Benign 2. mammogram 07/21/2020: [name]: Benign breast density category 8 3.  Bone density [name] 10/28/2018 T score -1.8: Osteopenia continue with calcium and vitamin D, on Boniva    Knee pain: Related to loss of cartilage.  Seeing orthopedics.  CT CAP 01/09/21:  Negative Return to clinic in 1 year for follow-up    No orders of the defined types were placed in this encounter.  The patient has a good understanding of the overall plan. she agrees with it. she will call with any problems that may develop before the next visit here.  Total time spent: 20 mins including face to face time and time spent for planning, charting and coordination of care  Rulon Eisenmenger, MD, MPH 01/30/2021  I, Thana Ates, am acting as scribe for Dr. Nicholas Lose.  I have reviewed the above documentation for accuracy and completeness, and I agree with the above.

## 2021-01-30 ENCOUNTER — Other Ambulatory Visit: Payer: Self-pay

## 2021-01-30 ENCOUNTER — Inpatient Hospital Stay: Payer: PPO | Admitting: Hematology and Oncology

## 2021-01-30 DIAGNOSIS — Z170 Estrogen receptor positive status [ER+]: Secondary | ICD-10-CM

## 2021-01-30 DIAGNOSIS — Z923 Personal history of irradiation: Secondary | ICD-10-CM | POA: Diagnosis not present

## 2021-01-30 DIAGNOSIS — R109 Unspecified abdominal pain: Secondary | ICD-10-CM | POA: Diagnosis not present

## 2021-01-30 DIAGNOSIS — C50411 Malignant neoplasm of upper-outer quadrant of right female breast: Secondary | ICD-10-CM | POA: Diagnosis not present

## 2021-01-30 DIAGNOSIS — Z79899 Other long term (current) drug therapy: Secondary | ICD-10-CM | POA: Diagnosis not present

## 2021-01-30 DIAGNOSIS — Z79811 Long term (current) use of aromatase inhibitors: Secondary | ICD-10-CM | POA: Diagnosis not present

## 2021-01-30 DIAGNOSIS — Z9221 Personal history of antineoplastic chemotherapy: Secondary | ICD-10-CM | POA: Diagnosis not present

## 2021-01-30 MED ORDER — ANASTROZOLE 1 MG PO TABS: 1.0000 mg | ORAL_TABLET | Freq: Every day | ORAL | 3 refills | Status: DC

## 2021-02-10 DIAGNOSIS — M160 Bilateral primary osteoarthritis of hip: Secondary | ICD-10-CM | POA: Diagnosis not present

## 2021-02-10 DIAGNOSIS — M4185 Other forms of scoliosis, thoracolumbar region: Secondary | ICD-10-CM | POA: Diagnosis not present

## 2021-02-10 DIAGNOSIS — M5459 Other low back pain: Secondary | ICD-10-CM | POA: Diagnosis not present

## 2021-02-10 DIAGNOSIS — M1711 Unilateral primary osteoarthritis, right knee: Secondary | ICD-10-CM | POA: Diagnosis not present

## 2021-03-06 DIAGNOSIS — M1711 Unilateral primary osteoarthritis, right knee: Secondary | ICD-10-CM | POA: Diagnosis not present

## 2021-03-10 DIAGNOSIS — N3020 Other chronic cystitis without hematuria: Secondary | ICD-10-CM | POA: Diagnosis not present

## 2021-03-14 DIAGNOSIS — H353221 Exudative age-related macular degeneration, left eye, with active choroidal neovascularization: Secondary | ICD-10-CM | POA: Diagnosis not present

## 2021-03-15 DIAGNOSIS — M1711 Unilateral primary osteoarthritis, right knee: Secondary | ICD-10-CM | POA: Diagnosis not present

## 2021-03-15 DIAGNOSIS — M25561 Pain in right knee: Secondary | ICD-10-CM | POA: Diagnosis not present

## 2021-03-21 DIAGNOSIS — Z961 Presence of intraocular lens: Secondary | ICD-10-CM | POA: Diagnosis not present

## 2021-03-21 DIAGNOSIS — H353112 Nonexudative age-related macular degeneration, right eye, intermediate dry stage: Secondary | ICD-10-CM | POA: Diagnosis not present

## 2021-03-21 DIAGNOSIS — H35373 Puckering of macula, bilateral: Secondary | ICD-10-CM | POA: Diagnosis not present

## 2021-03-21 DIAGNOSIS — H35723 Serous detachment of retinal pigment epithelium, bilateral: Secondary | ICD-10-CM | POA: Diagnosis not present

## 2021-03-21 DIAGNOSIS — H35453 Secondary pigmentary degeneration, bilateral: Secondary | ICD-10-CM | POA: Diagnosis not present

## 2021-03-21 DIAGNOSIS — H35363 Drusen (degenerative) of macula, bilateral: Secondary | ICD-10-CM | POA: Diagnosis not present

## 2021-03-21 DIAGNOSIS — H353221 Exudative age-related macular degeneration, left eye, with active choroidal neovascularization: Secondary | ICD-10-CM | POA: Diagnosis not present

## 2021-03-22 DIAGNOSIS — M25561 Pain in right knee: Secondary | ICD-10-CM | POA: Diagnosis not present

## 2021-03-22 DIAGNOSIS — M1711 Unilateral primary osteoarthritis, right knee: Secondary | ICD-10-CM | POA: Diagnosis not present

## 2021-03-23 DIAGNOSIS — H353221 Exudative age-related macular degeneration, left eye, with active choroidal neovascularization: Secondary | ICD-10-CM | POA: Diagnosis not present

## 2021-04-03 DIAGNOSIS — M1711 Unilateral primary osteoarthritis, right knee: Secondary | ICD-10-CM | POA: Diagnosis not present

## 2021-04-27 DIAGNOSIS — H353221 Exudative age-related macular degeneration, left eye, with active choroidal neovascularization: Secondary | ICD-10-CM | POA: Diagnosis not present

## 2021-05-01 DIAGNOSIS — H35722 Serous detachment of retinal pigment epithelium, left eye: Secondary | ICD-10-CM | POA: Diagnosis not present

## 2021-05-01 DIAGNOSIS — H35373 Puckering of macula, bilateral: Secondary | ICD-10-CM | POA: Diagnosis not present

## 2021-05-01 DIAGNOSIS — H353112 Nonexudative age-related macular degeneration, right eye, intermediate dry stage: Secondary | ICD-10-CM | POA: Diagnosis not present

## 2021-05-01 DIAGNOSIS — H35363 Drusen (degenerative) of macula, bilateral: Secondary | ICD-10-CM | POA: Diagnosis not present

## 2021-05-01 DIAGNOSIS — H353221 Exudative age-related macular degeneration, left eye, with active choroidal neovascularization: Secondary | ICD-10-CM | POA: Diagnosis not present

## 2021-05-01 DIAGNOSIS — H35453 Secondary pigmentary degeneration, bilateral: Secondary | ICD-10-CM | POA: Diagnosis not present

## 2021-06-06 DIAGNOSIS — H26491 Other secondary cataract, right eye: Secondary | ICD-10-CM | POA: Diagnosis not present

## 2021-06-06 DIAGNOSIS — H353112 Nonexudative age-related macular degeneration, right eye, intermediate dry stage: Secondary | ICD-10-CM | POA: Diagnosis not present

## 2021-06-06 DIAGNOSIS — H353221 Exudative age-related macular degeneration, left eye, with active choroidal neovascularization: Secondary | ICD-10-CM | POA: Diagnosis not present

## 2021-06-07 DIAGNOSIS — H353221 Exudative age-related macular degeneration, left eye, with active choroidal neovascularization: Secondary | ICD-10-CM | POA: Diagnosis not present

## 2021-06-23 DIAGNOSIS — M79641 Pain in right hand: Secondary | ICD-10-CM | POA: Diagnosis not present

## 2021-06-23 DIAGNOSIS — M25831 Other specified joint disorders, right wrist: Secondary | ICD-10-CM | POA: Diagnosis not present

## 2021-06-26 ENCOUNTER — Other Ambulatory Visit: Payer: Self-pay | Admitting: Orthopedic Surgery

## 2021-06-26 DIAGNOSIS — M25831 Other specified joint disorders, right wrist: Secondary | ICD-10-CM

## 2021-06-29 ENCOUNTER — Ambulatory Visit
Admission: RE | Admit: 2021-06-29 | Discharge: 2021-06-29 | Disposition: A | Discharge status: Home / Self Care | Payer: PPO | Source: Ambulatory Visit | Attending: Orthopedic Surgery | Admitting: Orthopedic Surgery

## 2021-06-29 ENCOUNTER — Other Ambulatory Visit: Payer: Self-pay | Admitting: Orthopedic Surgery

## 2021-06-29 DIAGNOSIS — M67439 Ganglion, unspecified wrist: Secondary | ICD-10-CM | POA: Diagnosis not present

## 2021-06-29 DIAGNOSIS — M25831 Other specified joint disorders, right wrist: Secondary | ICD-10-CM

## 2021-07-12 DIAGNOSIS — H353221 Exudative age-related macular degeneration, left eye, with active choroidal neovascularization: Secondary | ICD-10-CM | POA: Diagnosis not present

## 2021-07-19 DIAGNOSIS — H26491 Other secondary cataract, right eye: Secondary | ICD-10-CM | POA: Diagnosis not present

## 2021-07-24 ENCOUNTER — Encounter: Payer: Self-pay | Admitting: Hematology and Oncology

## 2021-07-24 DIAGNOSIS — Z1231 Encounter for screening mammogram for malignant neoplasm of breast: Secondary | ICD-10-CM | POA: Diagnosis not present

## 2021-07-27 ENCOUNTER — Telehealth: Payer: Self-pay | Admitting: Emergency Medicine

## 2021-07-27 MED ORDER — TRELEGY ELLIPTA 100-62.5-25 MCG/ACT IN AEPB: INHALATION_SPRAY | RESPIRATORY_TRACT | 6 refills | Status: DC

## 2021-07-27 NOTE — Telephone Encounter (Signed)
Called patient and verified that refills were sent in for inhaler. Verified pharmacy. Nothing further needed

## 2021-08-02 DIAGNOSIS — M1711 Unilateral primary osteoarthritis, right knee: Secondary | ICD-10-CM | POA: Diagnosis not present

## 2021-08-02 DIAGNOSIS — M25561 Pain in right knee: Secondary | ICD-10-CM | POA: Diagnosis not present

## 2021-08-07 DIAGNOSIS — M67431 Ganglion, right wrist: Secondary | ICD-10-CM | POA: Diagnosis not present

## 2021-08-07 DIAGNOSIS — M24131 Other articular cartilage disorders, right wrist: Secondary | ICD-10-CM | POA: Diagnosis not present

## 2021-08-21 DIAGNOSIS — H353221 Exudative age-related macular degeneration, left eye, with active choroidal neovascularization: Secondary | ICD-10-CM | POA: Diagnosis not present

## 2021-08-25 DIAGNOSIS — D692 Other nonthrombocytopenic purpura: Secondary | ICD-10-CM | POA: Diagnosis not present

## 2021-08-25 DIAGNOSIS — L820 Inflamed seborrheic keratosis: Secondary | ICD-10-CM | POA: Diagnosis not present

## 2021-08-25 DIAGNOSIS — L821 Other seborrheic keratosis: Secondary | ICD-10-CM | POA: Diagnosis not present

## 2021-08-25 DIAGNOSIS — L720 Epidermal cyst: Secondary | ICD-10-CM | POA: Diagnosis not present

## 2021-08-25 DIAGNOSIS — Z85828 Personal history of other malignant neoplasm of skin: Secondary | ICD-10-CM | POA: Diagnosis not present

## 2021-09-11 DIAGNOSIS — J189 Pneumonia, unspecified organism: Secondary | ICD-10-CM | POA: Diagnosis not present

## 2021-09-11 DIAGNOSIS — R051 Acute cough: Secondary | ICD-10-CM | POA: Diagnosis not present

## 2021-09-11 DIAGNOSIS — J449 Chronic obstructive pulmonary disease, unspecified: Secondary | ICD-10-CM | POA: Diagnosis not present

## 2021-09-11 DIAGNOSIS — I10 Essential (primary) hypertension: Secondary | ICD-10-CM | POA: Diagnosis not present

## 2021-09-25 DIAGNOSIS — H353221 Exudative age-related macular degeneration, left eye, with active choroidal neovascularization: Secondary | ICD-10-CM | POA: Diagnosis not present

## 2021-10-09 ENCOUNTER — Other Ambulatory Visit (HOSPITAL_COMMUNITY): Payer: Self-pay | Admitting: Registered Nurse

## 2021-10-09 ENCOUNTER — Other Ambulatory Visit: Payer: Self-pay | Admitting: Registered Nurse

## 2021-10-09 ENCOUNTER — Ambulatory Visit (HOSPITAL_COMMUNITY)
Admission: RE | Admit: 2021-10-09 | Discharge: 2021-10-09 | Disposition: A | Discharge status: Home / Self Care | Payer: PPO | Source: Ambulatory Visit | Attending: Registered Nurse | Admitting: Registered Nurse

## 2021-10-09 DIAGNOSIS — J449 Chronic obstructive pulmonary disease, unspecified: Secondary | ICD-10-CM | POA: Diagnosis not present

## 2021-10-09 DIAGNOSIS — I10 Essential (primary) hypertension: Secondary | ICD-10-CM | POA: Diagnosis not present

## 2021-10-09 DIAGNOSIS — J189 Pneumonia, unspecified organism: Secondary | ICD-10-CM | POA: Diagnosis not present

## 2021-10-09 DIAGNOSIS — D72825 Bandemia: Secondary | ICD-10-CM | POA: Diagnosis not present

## 2021-10-09 DIAGNOSIS — J181 Lobar pneumonia, unspecified organism: Secondary | ICD-10-CM | POA: Diagnosis not present

## 2021-10-09 DIAGNOSIS — J9811 Atelectasis: Secondary | ICD-10-CM | POA: Diagnosis not present

## 2021-10-09 DIAGNOSIS — R9389 Abnormal findings on diagnostic imaging of other specified body structures: Secondary | ICD-10-CM | POA: Diagnosis not present

## 2021-10-09 DIAGNOSIS — J439 Emphysema, unspecified: Secondary | ICD-10-CM | POA: Diagnosis not present

## 2021-10-09 MED ORDER — IOHEXOL 300 MG/ML  SOLN
75.0000 mL | Freq: Once | INTRAMUSCULAR | Status: AC | PRN
  Administered 2021-10-09: 75 mL via INTRAVENOUS

## 2021-10-16 ENCOUNTER — Other Ambulatory Visit: Payer: Self-pay | Admitting: Internal Medicine

## 2021-10-16 ENCOUNTER — Other Ambulatory Visit (HOSPITAL_COMMUNITY): Payer: Self-pay | Admitting: Internal Medicine

## 2021-10-16 DIAGNOSIS — R918 Other nonspecific abnormal finding of lung field: Secondary | ICD-10-CM

## 2021-10-19 ENCOUNTER — Ambulatory Visit (HOSPITAL_COMMUNITY)
Admission: RE | Admit: 2021-10-19 | Discharge: 2021-10-19 | Disposition: A | Discharge status: Home / Self Care | Payer: PPO | Source: Ambulatory Visit | Attending: Internal Medicine | Admitting: Internal Medicine

## 2021-10-19 DIAGNOSIS — R918 Other nonspecific abnormal finding of lung field: Secondary | ICD-10-CM | POA: Insufficient documentation

## 2021-10-19 LAB — GLUCOSE, CAPILLARY: Glucose-Capillary: 108 mg/dL — ABNORMAL HIGH (ref 70–99)

## 2021-10-19 MED ORDER — FLUDEOXYGLUCOSE F - 18 (FDG) INJECTION
5.7000 | Freq: Once | INTRAVENOUS | Status: AC
  Administered 2021-10-19: 5.8 via INTRAVENOUS

## 2021-10-24 ENCOUNTER — Telehealth: Payer: Self-pay | Admitting: Emergency Medicine

## 2021-10-24 NOTE — Telephone Encounter (Signed)
Received referral in [place] for pt giving the dx of severe emphysema. Pt is requesting to see Dr. Loanne Drilling at the advice of her PCP to "follow her more closely moving forward." Pt is already established with [facility], so we will need to approve the transfer of care from [place] to [place]. Please advise.

## 2021-10-24 NOTE — Telephone Encounter (Signed)
Okay with me as long as Dr. Loanne Drilling approves

## 2021-10-24 NOTE — Telephone Encounter (Signed)
OK with me. Please schedule as new patient slot when next available.

## 2021-10-24 NOTE — Telephone Encounter (Signed)
Message routed to Dr. Lamonte Sakai and Dr. Loanne Drilling to advise

## 2021-10-25 NOTE — Telephone Encounter (Signed)
Please call to make a follow up with the patient with Dr. Loanne Drilling and not Dr. Lamonte Sakai.. First available with Dr. Loanne Drilling as a new patient please.

## 2021-10-30 ENCOUNTER — Encounter: Payer: PPO | Admitting: Thoracic Surgery (Cardiothoracic Vascular Surgery)

## 2021-10-31 ENCOUNTER — Other Ambulatory Visit: Payer: Self-pay | Admitting: *Deleted

## 2021-10-31 ENCOUNTER — Encounter: Payer: Self-pay | Admitting: Thoracic Surgery (Cardiothoracic Vascular Surgery)

## 2021-10-31 ENCOUNTER — Institutional Professional Consult (permissible substitution): Payer: PPO | Admitting: Thoracic Surgery (Cardiothoracic Vascular Surgery)

## 2021-10-31 VITALS — BP 153/82 | HR 82 | Resp 18 | Ht 65.0 in | Wt 113.0 lb

## 2021-10-31 DIAGNOSIS — R918 Other nonspecific abnormal finding of lung field: Secondary | ICD-10-CM

## 2021-10-31 NOTE — Progress Notes (Signed)
PCP is Crist Infante, MD Referring Provider is Crist Infante, MD  Chief Complaint  Patient presents with   Lung Mass    Initial surgical consult, CT chest 8/21, PET 8/31    HPI: Anna York is sent for consultation regarding bilateral pulmonary abnormalities.  Anna York is an 81 year old woman with a past history significant for tobacco abuse, COPD, breast cancer, TIA, reflux, osteopenia, osteoarthritis, and multiple pneumonias.  She was in her usual state of health until about August.  She developed productive cough fevers general malaise.  She was seen by Dr. Joylene Draft and treated with antibiotics.  A chest x-ray showed some chronic scarring in the right upper lobe and a new left infrahilar infiltrate.  She had a CT of the chest on 10/09/2021 which showed subsegmental atelectasis in the right upper lobe, right hilar soft tissue opacity, consolidation in the left lower lobe, COPD and chronic interstitial fibrotic changes.  There was some hilar and mediastinal adenopathy.  A PET/CT was done on 10/19/2021.  It showed partial clearing in the left lower lobe.  Persistent consolidation in the right upper lobe.  There was some intermediate metabolic activity in both areas.  Mediastinal nodes were not hypermetabolic.  Findings were favored to be infectious/inflammatory.  She smoked about a pack a day for 30 years prior to quitting 21 years ago.  She has shortness of breath with exertion.  That worsened when she had pneumonia but has improved back to her baseline.  She is on Trelegy inhaler.  Past Medical History:  Diagnosis Date   ASCUS (atypical squamous cells of undetermined significance) on Pap smear    Neg high risk HPV   Asthma    Atrophic vaginitis    Backache, unspecified    Breast cancer ([place]) 1992   Chemo and Radiation   Constipation    COPD (chronic obstructive pulmonary disease) (HCC)    Cystitis, unspecified    Diverticulosis    Dysphagia    GERD (gastroesophageal reflux  disease)    indigestion   Headache(784.0)    Lymphocytic colitis    Osteoarthritis    Osteopenia 2012   T score -2.1   Osteoporosis    Palpitations    Personal history of colonic polyps 07/07/2002   hyperplastic   Personal history of malignant neoplasm of breast    Pneumonia    Stroke ([place]) 2010   2 TIA's    TIA (transient ischemic attack)    Unspecified transient cerebral ischemia     Past Surgical History:  Procedure Laterality Date   BREAST BIOPSY     BREAST LUMPECTOMY     chemo and radiation   BREAST LUMPECTOMY WITH RADIOACTIVE SEED LOCALIZATION Right 08/09/2016   Procedure: RIGHT BREAST LUMPECTOMY WITH RADIOACTIVE SEED LOCALIZATION;  Surgeon: [name], MD;  Location: [facility];  Service: General;  Laterality: Right;   CARDIAC CATHETERIZATION     CATARACT EXTRACTION     COLONOSCOPY  2011   diverticulosis   COLPOSCOPY     FACIAL COSMETIC SURGERY     LYMPHADENECTOMY  1993   TONSILLECTOMY      Family History  Problem Relation Age of Onset   Breast cancer Mother        Age 25   Hypertension Father     Social History Social History   Tobacco Use   Smoking status: Former    Packs/day: 1.00    Years: 30.00    Total pack years: 30.00    Types: Cigarettes  Quit date: 02/20/2000    Years since quitting: 21.7   Smokeless tobacco: Never  Vaping Use   Vaping Use: Never used  Substance Use Topics   Alcohol use: Yes    Alcohol/week: 14.0 standard drinks of alcohol    Types: 14 Glasses of wine per week    Comment: daily   Drug use: No    Current Outpatient Medications  Medication Sig Dispense Refill   albuterol (VENTOLIN HFA) 108 (90 Base) MCG/ACT inhaler Inhale 2 puffs into the lungs every 6 (six) hours as needed. 18 g 5   anastrozole (ARIMIDEX) 1 MG tablet Take 1 tablet (1 mg total) by mouth daily. 90 tablet 3   Cholecalciferol (VITAMIN D) 2000 units CAPS Take 2,000 Units by mouth daily.     clopidogrel (PLAVIX) 75 MG tablet Take 75 mg by mouth daily.      Fluticasone-Umeclidin-Vilant (TRELEGY ELLIPTA) 100-62.5-25 MCG/ACT AEPB INHALE ONE PUFF INTO THE LUNGS DAILY 60 each 6   ibandronate (BONIVA) 150 MG tablet Take 150 mg by mouth every 30 (thirty) days. Take in the morning with a full glass of water, on an empty stomach, and do not take anything else by mouth or lie down for the next 30 min.     metoprolol succinate (TOPROL-XL) 25 MG 24 hr tablet Take 12.5 mg by mouth daily.     nystatin (MYCOSTATIN) 100000 UNIT/ML suspension      omeprazole (PRILOSEC) 20 MG capsule Take 20 mg by mouth daily.     TRELEGY ELLIPTA 100-62.5-25 MCG/INH AEPB INHALE ONE PUFF INTO THE LUNGS DAILY 60 each 11   No current facility-administered medications for this visit.    Allergies  Allergen Reactions   Erythromycin Nausea And Vomiting   Macrobid [Nitrofurantoin] Hives and Rash    Review of Systems  Constitutional:  Positive for unexpected weight change (Has lost about 5 pounds in the past year).  HENT:  Negative for trouble swallowing and voice change.   Respiratory:  Positive for cough and shortness of breath.   Cardiovascular:  Negative for chest pain and leg swelling.  Genitourinary:  Negative for difficulty urinating.  Musculoskeletal:  Positive for arthralgias and joint swelling.  Neurological:  Negative for seizures and weakness.  Hematological:  Negative for adenopathy. Does not bruise/bleed easily.    BP (!) 153/82 (BP Location: Left Arm, Patient Position: Sitting)   Pulse 82   Resp 18   Ht '5\' 5"'$  (1.651 m)   Wt 113 lb (51.3 kg)   SpO2 97% Comment: RA  BMI 18.80 kg/m  Physical Exam Vitals reviewed.  Constitutional:      General: She is not in acute distress.    Appearance: Normal appearance.  HENT:     Head: Normocephalic and atraumatic.  Eyes:     General: No scleral icterus.    Extraocular Movements: Extraocular movements intact.  Neck:     Vascular: No carotid bruit.  Cardiovascular:     Rate and Rhythm: Normal rate.     Heart  sounds: No murmur heard.    Comments: Slightly irregular Pulmonary:     Effort: Pulmonary effort is normal. No respiratory distress.     Breath sounds: No wheezing or rales.     Comments: Diminished breath sounds bilaterally Lymphadenopathy:     Cervical: No cervical adenopathy.  Skin:    General: Skin is warm and dry.  Neurological:     General: No focal deficit present.     Mental Status: She is alert  and oriented to person, place, and time.     Cranial Nerves: No cranial nerve deficit.     Motor: No weakness.    Diagnostic Tests: CT CHEST WITH CONTRAST   TECHNIQUE: Multidetector CT imaging of the chest was performed during intravenous contrast administration.   RADIATION DOSE REDUCTION: This exam was performed according to the departmental dose-optimization program which includes automated exposure control, adjustment of the mA and/or kV according to patient size and/or use of iterative reconstruction technique.   CONTRAST:  60m OMNIPAQUE IOHEXOL 300 MG/ML  SOLN IV   COMPARISON:  03/01/2017   FINDINGS: Cardiovascular: Atherosclerotic calcifications aorta, minimally in coronary arteries. Aorta normal caliber without aneurysm or dissection. Heart unremarkable. No pericardial effusion.   Mediastinum/Nodes: Esophagus unremarkable. Enlarged subcarinal lymph node 15 mm image 67. Enlarged precarinal lymph node 14 mm image 61. Additional prominent precarinal node 12 mm image 57. Additional soft tissue opacity at RIGHT pulmonary hilum 3.1 x 1.4 cm image 59, could represent adenopathy, tumor, less likely atelectasis. Base of cervical region unremarkable.   Lungs/Pleura: Emphysematous changes greatest in upper lobes. Subsegmental atelectasis RIGHT upper lobe, question postobstructive. Ill-defined opacity centrally in RIGHT lower lobe could represent infiltrate or scarring. Airspace consolidation LEFT lower lobe. Honeycomb formation throughout basilar portion of LEFT lower  lobe. No additional infiltrate, pleural effusion, pneumothorax, or nodule.   Upper Abdomen: Unremarkable   Musculoskeletal: Diffuse osseous demineralization. No discrete bone lesions. Advanced degenerative changes of the RIGHT glenohumeral joint.   IMPRESSION: LEFT lower lobe consolidation consistent with pneumonia.   Subsegmental atelectasis RIGHT upper lobe, question postobstructive.   RIGHT hilar soft tissue opacity 3.1 x 1.4 cm, could represent adenopathy, tumor, less likely atelectasis.   Ill-defined opacity centrally in RIGHT lower lobe could represent infiltrate or scarring, progressive since 2019, tumor considered less likely but not entirely excluded.   Consider either short-term follow-up CT to ensure resolution or further evaluation by PET-CT imaging to exclude neoplasm.   Aortic Atherosclerosis (ICD10-I70.0) and Emphysema (ICD10-J43.9).     Electronically Signed   By: MLavonia DanaM.D.   On: 10/09/2021 14:43  NUCLEAR MEDICINE PET SKULL BASE TO THIGH   TECHNIQUE: 5.88 mCi F-18 FDG was injected intravenously. Full-ring PET imaging was performed from the skull base to thigh after the radiotracer. CT data was obtained and used for attenuation correction and anatomic localization.   Fasting blood glucose: 108 mg/dl   COMPARISON:  Chest CT 10/09/2021, abdominopelvic CT 01/09/2021 and chest CT 03/01/2017.   FINDINGS: Mediastinal blood pool activity: SUV max 2.1   NECK:   No hypermetabolic cervical lymph nodes are identified.Mildly asymmetric hypermetabolic activity within the left nasopharynx is without CT correlate (SUV max 4.5), likely physiologic. No suspicious activity associated with the pharyngeal mucosal space.   Incidental CT findings: none   CHEST:   No significant hypermetabolic activity associated with the previously demonstrated mildly prominent precarinal and subcarinal lymph nodes. There is no axillary adenopathy. There is  persistent partial right upper lobe collapse with intermediate level hypermetabolic activity in the right suprahilar region (SUV max 6.4). No significant hypermetabolic activity within the patchy pulmonary opacities superiorly in the right lower lobe. Interval partial clearing of presumed left lower lobe airspace disease which currently measures 3.8 x 2.0 cm on image 36/7 (previously 4.7 x 3.7 cm). This is mildly hypermetabolic with an SUV max of 4.2.   Incidental CT findings: Severe centrilobular and paraseptal emphysema. Diffuse atherosclerosis of the aorta, great vessels and coronary arteries. No significant pleural or  pericardial effusion.   ABDOMEN/PELVIS:   There is no hypermetabolic activity within the liver, adrenal glands, spleen or pancreas. There is no hypermetabolic nodal activity in the abdomen or pelvis.   Incidental CT findings: Stable simple appearing cyst in the upper pole the right kidney measuring 4.3 cm on image 102/4; no follow-up imaging recommended. Aortic and branch vessel atherosclerosis. Diverticular changes throughout the distal colon and a small calcified uterine fibroid are noted.   SKELETON:   There is no hypermetabolic activity to suggest osseous metastatic disease.   Incidental CT findings: Convex right thoracolumbar scoliosis with associated multilevel spondylosis.   IMPRESSION: 1. Interval partial clearing of dense lower lobe consolidation, most consistent with resolving pneumonia. This demonstrates nonspecific low-level hypermetabolic activity. 2. Persistent partial right upper lobe collapse with associated nonspecific intermediate metabolic activity in the right suprahilar region. Given the contralateral findings, findings are favored to be infectious, although neoplasm is not completely excluded. 3. The previously noted prominent mediastinal lymph nodes are not hypermetabolic, likely reactive. 4. No suspicious hypermetabolic activity  within the neck, abdomen or pelvis. 5. Chest CT follow-up (preferably with contrast) in 3 months recommended unless there is strong clinical suspicion of neoplasm, in which case bronchoscopy should be considered. 6. Coronary and aortic atherosclerosis (ICD10-I70.0). Emphysema (ICD10-J43.9).     Electronically Signed   By: Richardean Sale M.D.   On: 10/19/2021 10:59  I personally reviewed the CT and PET/CT images.  Right upper lobe consolidation and left lower lobe consolidation.  Significant improvement in left lower lobe consolidation in 10 days between CT and PET/CT.  No hypermetabolic activity in mediastinal nodes.  Moderate hypermetabolic activity in right upper lobe and left lower lobe.  No discrete mass.  Impression: Anna York is an 81 year old woman with a past history significant for tobacco abuse, COPD, breast cancer, TIA, reflux, osteopenia, osteoarthritis, and multiple pneumonias.  She presented with pneumonia in August.  Note was treated with antibiotics.  CT of the chest showed areas of consolidation/atelectasis in the left lower lobe and also in the right upper lobe.  On PET/CT 7 days later there was significant improvement in the left lower lobe.  Right upper lobe area was slightly improved but not as dramatically as the left lower lobe.  There is some metabolic activity in both areas on PET.  Findings are most likely infectious related.  Cannot rule out possibility of underlying malignancy.  We discussed 2 potential options.  The first would be to go ahead and biopsy.  The second option would be to do an interval CT in about 6 weeks.  That would be 8 weeks from her original scan.  If there is improvement we can continue to follow radiographically.  If there is progression or other concern we can proceed with biopsy.  COPD and interstitial fibrosis-previously saw Dr. Lamonte Sakai.  She is going to see Dr. Loanne Drilling in the future.  On Trelegy.  Symptoms stable outside of recent  acute infection.  Plan: Return in 6 weeks with CT chest  Over 30 minutes in review of records, images, and consultation with Mrs. Beckerman today. Melrose Nakayama, MD [facility] [phone]

## 2021-10-31 NOTE — Progress Notes (Signed)
CTC

## 2021-11-02 DIAGNOSIS — H353221 Exudative age-related macular degeneration, left eye, with active choroidal neovascularization: Secondary | ICD-10-CM | POA: Diagnosis not present

## 2021-11-06 ENCOUNTER — Encounter: Payer: PPO | Admitting: Thoracic Surgery (Cardiothoracic Vascular Surgery)

## 2021-11-20 ENCOUNTER — Ambulatory Visit: Payer: PPO | Admitting: Pulmonary Disease

## 2021-11-20 ENCOUNTER — Encounter: Payer: Self-pay | Admitting: Pulmonary Disease

## 2021-11-20 VITALS — BP 130/70 | HR 85 | Ht 65.0 in | Wt 111.4 lb

## 2021-11-20 DIAGNOSIS — J449 Chronic obstructive pulmonary disease, unspecified: Secondary | ICD-10-CM

## 2021-11-20 DIAGNOSIS — R918 Other nonspecific abnormal finding of lung field: Secondary | ICD-10-CM

## 2021-11-20 NOTE — Progress Notes (Signed)
Subjective:   PATIENT ID: Anna York GENDER: female DOB: May 14, 1940, MRN: [id number]  Chief Complaint  Patient presents with   New Patient (Initial Visit)    Pneumonia over summer    Reason for Visit: New Patient  [name] is 81 year old with COPD, emphysema, osteoporosis, TIA, hx breast cancer who presents as a new patient to me for Pulmonary care.  She was previously followed by Dr. Lamonte York for COPD. She is compliant with Trelegy daily. She continues to have mucous production especially after her pneumonia in the summer. Denies shortness of breath or wheezing.   She recently was seen by CTS for abnormal CT with bilateral pulmonary infiltrates. Earlier this summer she was treated for pneumonia and had a CXR demonstrating right upper lobe and left lower lobe abnormalities. Follow-up CT and PET/CT were ordered and demonstrated right upper lobe atelectasis and left lower lobe consolidation - that was improved on latest scan.  Social History: Former smoker. 30 pack- years. Quit >20 years ago.  I have personally reviewed patient's past medical/family/social history, allergies, current medications.  Past Medical History:  Diagnosis Date   ASCUS (atypical squamous cells of undetermined significance) on Pap smear    Neg high risk HPV   Asthma    Atrophic vaginitis    Backache, unspecified    Breast cancer ([place]) 1992   Chemo and Radiation   Constipation    COPD (chronic obstructive pulmonary disease) (HCC)    Cystitis, unspecified    Diverticulosis    Dysphagia    GERD (gastroesophageal reflux disease)    indigestion   Headache(784.0)    Lymphocytic colitis    Osteoarthritis    Osteopenia 2012   T score -2.1   Osteoporosis    Palpitations    Personal history of colonic polyps 07/07/2002   hyperplastic   Personal history of malignant neoplasm of breast    Pneumonia    Stroke ([place]) 2010   2 TIA's    TIA (transient ischemic attack)    Unspecified  transient cerebral ischemia      Family History  Problem Relation Age of Onset   Breast cancer Mother        Age 3   Hypertension Father      Social History   Occupational History   Occupation: Cabin crew    Comment: [employer]    Employer: [employer]  Tobacco Use   Smoking status: Former    Packs/day: 1.00    Years: 30.00    Total pack years: 30.00    Types: Cigarettes    Quit date: 02/20/2000    Years since quitting: 21.7   Smokeless tobacco: Never  Vaping Use   Vaping Use: Never used  Substance and Sexual Activity   Alcohol use: Yes    Alcohol/week: 14.0 standard drinks of alcohol    Types: 14 Glasses of wine per week    Comment: daily   Drug use: No   Sexual activity: Not Currently    Birth control/protection: Post-menopausal    Comment: 1st intercourse 81 yo-Fewer than 5 partners    Allergies  Allergen Reactions   Erythromycin Nausea And Vomiting   Macrobid [Nitrofurantoin] Hives and Rash     Outpatient Medications Prior to Visit  Medication Sig Dispense Refill   albuterol (VENTOLIN HFA) 108 (90 Base) MCG/ACT inhaler Inhale 2 puffs into the lungs every 6 (six) hours as needed. 18 g 5   anastrozole (ARIMIDEX) 1 MG tablet Take 1  tablet (1 mg total) by mouth daily. 90 tablet 3   Cholecalciferol (VITAMIN D) 2000 units CAPS Take 2,000 Units by mouth daily.     clopidogrel (PLAVIX) 75 MG tablet Take 75 mg by mouth daily.     Fluticasone-Umeclidin-Vilant (TRELEGY ELLIPTA) 100-62.5-25 MCG/ACT AEPB INHALE ONE PUFF INTO THE LUNGS DAILY 60 each 6   ibandronate (BONIVA) 150 MG tablet Take 150 mg by mouth every 30 (thirty) days. Take in the morning with a full glass of water, on an empty stomach, and do not take anything else by mouth or lie down for the next 30 min.     nystatin (MYCOSTATIN) 100000 UNIT/ML suspension      omeprazole (PRILOSEC) 20 MG capsule Take 20 mg by mouth daily.     TRELEGY ELLIPTA 100-62.5-25 MCG/INH AEPB INHALE ONE PUFF INTO THE LUNGS DAILY 60 each 11    metoprolol succinate (TOPROL-XL) 25 MG 24 hr tablet Take 12.5 mg by mouth daily.     No facility-administered medications prior to visit.    Review of Systems  Constitutional:  Negative for chills, diaphoresis, fever, malaise/fatigue and weight loss.  HENT:  Negative for congestion.   Respiratory:  Negative for cough, hemoptysis, sputum production, shortness of breath and wheezing.   Cardiovascular:  Negative for chest pain, palpitations and leg swelling.     Objective:   Vitals:   11/20/21 1540  BP: 130/70  Pulse: 85  SpO2: 94%  Weight: 111 lb 6.4 oz (50.5 kg)  Height: '5\' 5"'$  (1.651 m)   SpO2: 94 % O2 Device: None (Room air)  Physical Exam: General: Well-appearing, no acute distress HENT: [place], AT Eyes: EOMI, no scleral icterus Respiratory: Clear to auscultation bilaterally.  No crackles, wheezing or rales Cardiovascular: RRR, -M/R/G, no JVD Extremities:-Edema,-tenderness Neuro: AAO x4, CNII-XII grossly intact Psych: Normal mood, normal affect  Data Reviewed:  Imaging: CT Chest 10/09/21 - RUL subsegmental atelectasis. Right hilar opacity measuring 3.1 x 1.4 cm. LLL consolidation. Emphysema with upper lobe predominance. LLL with honeycombed appearance vs interstitial thickening of previously seen emphysema in 03/02/17 PET/CT 10/19/21 - Partial right upper lobe collapse. Interval left lower lobe consolidation improved compared to prior CT 10/09/21  PFT: 03/26/17 FVC 2.63 (93%) FEV1 1.41 (67%) Ratio 54  TLC 120% DLCO not complete Interpretation: Moderate obstructive defect with air trapping and hyperinflation  Labs: CBC    Component Value Date/Time   WBC 5.1 01/21/2018 1403   WBC 4.4 01/15/2017 1427   WBC 6.0 08/09/2016 0602   RBC 4.26 01/21/2018 1403   HGB 13.6 01/21/2018 1403   HGB 13.5 01/15/2017 1427   HCT 42.3 01/21/2018 1403   HCT 40.9 01/15/2017 1427   PLT 240 01/21/2018 1403   PLT 232 01/15/2017 1427   MCV 99.3 01/21/2018 1403   MCV 95.1 01/15/2017 1427    MCH 31.9 01/21/2018 1403   MCHC 32.2 01/21/2018 1403   RDW 13.6 01/21/2018 1403   RDW 13.8 01/15/2017 1427   LYMPHSABS 0.7 01/21/2018 1403   LYMPHSABS 0.5 (L) 01/15/2017 1427   MONOABS 0.7 01/21/2018 1403   MONOABS 0.6 01/15/2017 1427   EOSABS 0.0 01/21/2018 1403   EOSABS 0.0 01/15/2017 1427   BASOSABS 0.1 01/21/2018 1403   BASOSABS 0.0 01/15/2017 1427   Absolute eosinophils - 0 01/2018     Assessment & Plan:   Discussion: 81 year old with COPD, emphysema, osteoporosis, TIA, hx breast cancer who presents as a new patient to me for Pulmonary care. Emphysema overall well controlled with  breakthrough mucous production likely post-infectious vs progression of COPD. Recommend pulmonary clearance with flutter valve and mucinex. Regarding her CT, chest imaging personally reviewed by me and demonstrated improving LLL infiltrate likely infectious/inflammatory. Scheduled for repeat CT at the end of the month. Remaining pulmonary findings include atelectasis and emphysema+/-honeycombing. Will evaluate parenchyma again on repeat imaging. No work-up indicated at this point.   Abnormal CT --Please contact the office when your CT is completed so I can review  Emphysema --CONTINUE Trelegy ONE puff ONCE a day --ORDER flutter valve. Use this twice a day. 5 inhalations at a time --START mucinex 600 mg daily. You can purchase this over-the-counter  Health Maintenance Immunization History  Administered Date(s) Administered   Influenza Split 11/20/2010, 12/10/2011, 11/19/2012   Influenza, High Dose Seasonal PF 10/21/2018   Influenza,inj,Quad PF,6+ Mos 11/19/2014   Influenza,inj,quad, With Preservative 11/19/2016   Influenza-Unspecified 12/03/2016   PFIZER(Purple Top)SARS-COV-2 Vaccination 03/10/2019, 03/28/2019, 11/04/2019   Pneumococcal Polysaccharide-23 02/19/2010   Pneumococcal-Unspecified 02/20/2015   Zoster Recombinat (Shingrix) 05/02/2018, 08/11/2018   Zoster, Live 02/20/2007   CT Lung  Screen - not qualified due to age.  No orders of the defined types were placed in this encounter. No orders of the defined types were placed in this encounter.   No follow-ups on file. The week of Nov 15th  I have spent a total time of 40-minutes on the day of the appointment reviewing prior documentation, coordinating care and discussing medical diagnosis and plan with the patient/family. Imaging, labs and tests included in this note have been reviewed and interpreted independently by me.  Zanya Lindo Rodman Pickle, MD [facility] Office Number [phone]

## 2021-11-20 NOTE — Patient Instructions (Signed)
Abnormal CT --ORDER flutter valve. Use this twice a day. 5 inhalations at a time --START mucinex 600 mg daily. You can purchase this over-the-counter --Please contact the office when your CT is completed so I can review  Emphysema --CONTINUE Trelegy ONE puff ONCE a day  Follow-up with me the week of November 15th

## 2021-11-22 DIAGNOSIS — R7989 Other specified abnormal findings of blood chemistry: Secondary | ICD-10-CM | POA: Diagnosis not present

## 2021-11-22 DIAGNOSIS — E559 Vitamin D deficiency, unspecified: Secondary | ICD-10-CM | POA: Diagnosis not present

## 2021-11-22 DIAGNOSIS — Z1212 Encounter for screening for malignant neoplasm of rectum: Secondary | ICD-10-CM | POA: Diagnosis not present

## 2021-11-22 DIAGNOSIS — I10 Essential (primary) hypertension: Secondary | ICD-10-CM | POA: Diagnosis not present

## 2021-11-22 DIAGNOSIS — E785 Hyperlipidemia, unspecified: Secondary | ICD-10-CM | POA: Diagnosis not present

## 2021-11-25 DIAGNOSIS — Z23 Encounter for immunization: Secondary | ICD-10-CM | POA: Diagnosis not present

## 2021-11-27 NOTE — Telephone Encounter (Signed)
Patient was seen by JE on 10/02. Will close this encounter.

## 2021-11-28 ENCOUNTER — Ambulatory Visit: Payer: PPO | Admitting: Emergency Medicine

## 2021-11-29 DIAGNOSIS — K921 Melena: Secondary | ICD-10-CM | POA: Diagnosis not present

## 2021-11-29 DIAGNOSIS — R82998 Other abnormal findings in urine: Secondary | ICD-10-CM | POA: Diagnosis not present

## 2021-11-29 DIAGNOSIS — R0602 Shortness of breath: Secondary | ICD-10-CM | POA: Diagnosis not present

## 2021-11-29 DIAGNOSIS — J441 Chronic obstructive pulmonary disease with (acute) exacerbation: Secondary | ICD-10-CM | POA: Diagnosis not present

## 2021-11-29 DIAGNOSIS — Z1331 Encounter for screening for depression: Secondary | ICD-10-CM | POA: Diagnosis not present

## 2021-11-29 DIAGNOSIS — E785 Hyperlipidemia, unspecified: Secondary | ICD-10-CM | POA: Diagnosis not present

## 2021-11-29 DIAGNOSIS — I10 Essential (primary) hypertension: Secondary | ICD-10-CM | POA: Diagnosis not present

## 2021-11-29 DIAGNOSIS — G459 Transient cerebral ischemic attack, unspecified: Secondary | ICD-10-CM | POA: Diagnosis not present

## 2021-11-29 DIAGNOSIS — Z0000 Encounter for general adult medical examination without abnormal findings: Secondary | ICD-10-CM | POA: Diagnosis not present

## 2021-11-29 DIAGNOSIS — M1990 Unspecified osteoarthritis, unspecified site: Secondary | ICD-10-CM | POA: Diagnosis not present

## 2021-11-29 DIAGNOSIS — M810 Age-related osteoporosis without current pathological fracture: Secondary | ICD-10-CM | POA: Diagnosis not present

## 2021-12-12 ENCOUNTER — Ambulatory Visit (INDEPENDENT_AMBULATORY_CARE_PROVIDER_SITE_OTHER): Payer: PPO | Admitting: Thoracic Surgery (Cardiothoracic Vascular Surgery)

## 2021-12-12 ENCOUNTER — Ambulatory Visit
Admission: RE | Admit: 2021-12-12 | Discharge: 2021-12-12 | Disposition: A | Discharge status: Home / Self Care | Payer: PPO | Source: Ambulatory Visit | Attending: Thoracic Surgery (Cardiothoracic Vascular Surgery) | Admitting: Thoracic Surgery (Cardiothoracic Vascular Surgery)

## 2021-12-12 VITALS — BP 130/74 | HR 100 | Resp 18 | Ht 64.0 in | Wt 113.0 lb

## 2021-12-12 DIAGNOSIS — R590 Localized enlarged lymph nodes: Secondary | ICD-10-CM | POA: Diagnosis not present

## 2021-12-12 DIAGNOSIS — R918 Other nonspecific abnormal finding of lung field: Secondary | ICD-10-CM

## 2021-12-12 DIAGNOSIS — J432 Centrilobular emphysema: Secondary | ICD-10-CM | POA: Diagnosis not present

## 2021-12-12 DIAGNOSIS — I700 Atherosclerosis of aorta: Secondary | ICD-10-CM | POA: Diagnosis not present

## 2021-12-12 MED ORDER — IOPAMIDOL (ISOVUE-300) INJECTION 61%
75.0000 mL | Freq: Once | INTRAVENOUS | Status: AC | PRN
  Administered 2021-12-12: 75 mL via INTRAVENOUS

## 2021-12-12 NOTE — Progress Notes (Signed)
[address]       [place] [address]             [phone]       HPI: Mrs. Marcella returns for follow-up abnormal pulmonary findings on CT.  Eleasha Cataldo is an 81 year old woman with a history of tobacco abuse, COPD, breast cancer, TIA, reflux, osteopenia, osteoarthritis, and multiple pneumonias.  In August she developed productive cough and fevers along with general malaise.  She was treated with antibiotics.  Chest x-ray showed chronic scarring in the right upper lobe and a new left infrahilar infiltrate.    CT of the chest showed subsegmental atelectasis in the right upper lobe with a right hilar soft tissue opacity, consolidation in the lower lobe, COPD, and chronic interstitial fibrotic changes.  There also was hilar and mediastinal adenopathy.  PET/CT showed partial clearing of the left upper lobe but persistent consolidation in the right upper lobe with intermediate metabolic uptake.  The mediastinal nodes were not hypermetabolic.  Findings favored and infectious or inflammatory etiology.  In the interim since her last visit she is been feeling well.  She still has a chronic cough.  No fevers or chills.  Past Medical History:  Diagnosis Date   ASCUS (atypical squamous cells of undetermined significance) on Pap smear    Neg high risk HPV   Asthma    Atrophic vaginitis    Backache, unspecified    Breast cancer ([place]) 1992   Chemo and Radiation   Constipation    COPD (chronic obstructive pulmonary disease) (HCC)    Cystitis, unspecified    Diverticulosis    Dysphagia    GERD (gastroesophageal reflux disease)    indigestion   Headache(784.0)    Lymphocytic colitis    Osteoarthritis    Osteopenia 2012   T score -2.1   Osteoporosis    Palpitations    Personal history of colonic polyps 07/07/2002   hyperplastic   Personal history of malignant neoplasm of breast    Pneumonia    Stroke ([place]) 2010   2 TIA's    TIA (transient ischemic attack)     Unspecified transient cerebral ischemia     Current Outpatient Medications  Medication Sig Dispense Refill   albuterol (VENTOLIN HFA) 108 (90 Base) MCG/ACT inhaler Inhale 2 puffs into the lungs every 6 (six) hours as needed. 18 g 5   anastrozole (ARIMIDEX) 1 MG tablet Take 1 tablet (1 mg total) by mouth daily. 90 tablet 3   Cholecalciferol (VITAMIN D) 2000 units CAPS Take 2,000 Units by mouth daily.     clopidogrel (PLAVIX) 75 MG tablet Take 75 mg by mouth daily.     Fluticasone-Umeclidin-Vilant (TRELEGY ELLIPTA) 100-62.5-25 MCG/ACT AEPB INHALE ONE PUFF INTO THE LUNGS DAILY 60 each 6   ibandronate (BONIVA) 150 MG tablet Take 150 mg by mouth every 30 (thirty) days. Take in the morning with a full glass of water, on an empty stomach, and do not take anything else by mouth or lie down for the next 30 min.     metoprolol succinate (TOPROL-XL) 25 MG 24 hr tablet Take 12.5 mg by mouth daily.     nystatin (MYCOSTATIN) 100000 UNIT/ML suspension      omeprazole (PRILOSEC) 20 MG capsule Take 20 mg by mouth daily.     TRELEGY ELLIPTA 100-62.5-25 MCG/INH AEPB INHALE ONE PUFF INTO THE LUNGS DAILY 60 each 11   No current facility-administered medications for this visit.  Physical Exam BP 130/74   Pulse 100   Resp 18   Ht '5\' 4"'$  (1.626 m)   Wt 113 lb (51.3 kg)   SpO2 95%   BMI 19.45 kg/m  81 year old woman in no acute distress Alert and oriented x3 with no focal deficits  Diagnostic Tests: CT CHEST WITH CONTRAST   TECHNIQUE: Multidetector CT imaging of the chest was performed during intravenous contrast administration.   RADIATION DOSE REDUCTION: This exam was performed according to the departmental dose-optimization program which includes automated exposure control, adjustment of the mA and/or kV according to patient size and/or use of iterative reconstruction technique.   CONTRAST:  84m ISOVUE-300 IOPAMIDOL (ISOVUE-300) INJECTION 61%   COMPARISON:  PET-CT 10/19/2021.  Chest CT  10/09/2021.   FINDINGS: Cardiovascular: The heart size is normal. No substantial pericardial effusion. Ascending thoracic aorta measures 4 cm diameter. Mild atherosclerotic calcification is noted in the wall of the thoracic aorta.   Mediastinum/Nodes: 13 mm short axis precarinal lymph node on 57/2 was 16 mm previously (remeasured). 9 mm short axis subcarinal node on 63/2 was 18 mm previously (remeasured). There is no hilar lymphadenopathy. The esophagus has normal imaging features. There is no axillary lymphadenopathy.   Lungs/Pleura: Advanced changes of centrilobular and paraseptal emphysema again noted. Compared to previous chest CT of 10/09/2021, the posterior right upper lobe consolidative opacity has decreased in the interval, becoming less confluent and measuring 5.4 x 2.2 cm today compared to 7.8 x 2.7 cm previously when remeasured at a similar location.   2.7 cm irregular focus of non confluent soft tissue in the right lower lobe (image 54/8) may be architectural distortion/scarring although neoplasm not excluded. This is stable in size when remeasured in a similar fashion on the prior study.   The 4.7 x 3.7 cm focal consolidative nodular opacity seen previously in the left lower lobe has almost completely resolved with only some minimal ground-glass attenuation residing in this location today.   No new suspicious pulmonary nodule or mass. There is no evidence of pleural effusion.   Upper Abdomen: Upper pole cyst right kidney has been incompletely visualized but appears similar to previous PET-CT. No followup imaging is recommended.   Musculoskeletal: No worrisome lytic or sclerotic osseous abnormality.   IMPRESSION: 1. Interval decrease in size of the posterior right upper lobe consolidative opacity, becoming less confluent and measuring 5.4 x 2.2 cm today compared to 7.8 x 2.7 cm previously. 2. 2.7 cm irregular focus of non confluent soft tissue in the  right lower lobe may be architectural distortion/scarring although neoplasm not excluded. This is unchanged in the interval in showed no significant hypermetabolism on previous PET-CT. 3. Interval near complete resolution of the 4.7 x 3.7 cm focal consolidative nodular opacity seen previously in the left lower lobe. 4. Interval decrease in mediastinal lymphadenopathy. 5. Aortic Atherosclerosis (ICD10-I70.0) and Emphysema (ICD10-J43.9).     Electronically Signed   By: EMisty StanleyM.D.   On: 12/12/2021 14:02 I personally reviewed the CT images.  Near complete resolution of the left lower lobe consolidation.  No change in the architectural distortion in the right lower lobe.  Decrease in the consolidation in the right upper lobe.  Impression: MKyeisha Janowiczis an 81year old woman with a history of tobacco abuse, COPD, breast cancer, TIA, reflux, osteopenia, osteoarthritis, and multiple pneumonias.  She had pneumonia in August.  CT showed consolidation in the right upper and left lower lobes.  There also was an area of architectural distortion in the  right lower lobe.  There was some hilar and mediastinal adenopathy.  On PET/CT there was intermediate uptake that was felt to most likely be infectious or inflammatory.  She now presents with a follow-up CT after 8 weeks.  There is been near complete resolution in the left lower lobe.  There has been improvement but not complete resolution in the right upper lobe.  The area of architectural distortion in the lower lobe is unchanged, but that area did not have any metabolic activity on PET.  Mediastinal adenopathy has improved.  Overall the picture is consistent with bilateral pneumonia that had improved with antibiotics.  She still has some consolidation in the right upper lobe.  This is likely just incomplete resolution of her pneumonia.  However, to be on the safe side I think we should repeat a scan in about 3 months.  Plan: Return in 3  months with CT chest  Melrose Nakayama, MD [facility] [phone]

## 2021-12-13 DIAGNOSIS — H353221 Exudative age-related macular degeneration, left eye, with active choroidal neovascularization: Secondary | ICD-10-CM | POA: Diagnosis not present

## 2021-12-20 DIAGNOSIS — M1711 Unilateral primary osteoarthritis, right knee: Secondary | ICD-10-CM | POA: Diagnosis not present

## 2021-12-27 DIAGNOSIS — H35363 Drusen (degenerative) of macula, bilateral: Secondary | ICD-10-CM | POA: Diagnosis not present

## 2021-12-27 DIAGNOSIS — H35453 Secondary pigmentary degeneration, bilateral: Secondary | ICD-10-CM | POA: Diagnosis not present

## 2021-12-27 DIAGNOSIS — Z961 Presence of intraocular lens: Secondary | ICD-10-CM | POA: Diagnosis not present

## 2021-12-27 DIAGNOSIS — H353221 Exudative age-related macular degeneration, left eye, with active choroidal neovascularization: Secondary | ICD-10-CM | POA: Diagnosis not present

## 2021-12-27 DIAGNOSIS — H353112 Nonexudative age-related macular degeneration, right eye, intermediate dry stage: Secondary | ICD-10-CM | POA: Diagnosis not present

## 2022-01-03 ENCOUNTER — Encounter (HOSPITAL_BASED_OUTPATIENT_CLINIC_OR_DEPARTMENT_OTHER): Payer: Self-pay | Admitting: Pulmonary Disease

## 2022-01-03 ENCOUNTER — Ambulatory Visit (INDEPENDENT_AMBULATORY_CARE_PROVIDER_SITE_OTHER): Payer: PPO | Admitting: Pulmonary Disease

## 2022-01-03 VITALS — BP 128/70 | HR 88 | Ht 64.0 in | Wt 112.7 lb

## 2022-01-03 DIAGNOSIS — Z8701 Personal history of pneumonia (recurrent): Secondary | ICD-10-CM | POA: Diagnosis not present

## 2022-01-03 DIAGNOSIS — J449 Chronic obstructive pulmonary disease, unspecified: Secondary | ICD-10-CM

## 2022-01-03 MED ORDER — ALBUTEROL SULFATE HFA 108 (90 BASE) MCG/ACT IN AERS
2.0000 | INHALATION_SPRAY | Freq: Four times a day (QID) | RESPIRATORY_TRACT | 2 refills | Status: DC | PRN
Start: 2022-01-03 — End: 2023-01-22

## 2022-01-03 MED ORDER — TRELEGY ELLIPTA 100-62.5-25 MCG/ACT IN AEPB: INHALATION_SPRAY | RESPIRATORY_TRACT | 11 refills | Status: DC

## 2022-01-03 NOTE — Patient Instructions (Signed)
  Emphysema --REFILL Trelegy ONE puff ONCE a day --REFILL Albuterol TWO puffs as needed for shortness of breath or wheezing --Continue flutter valve. Use this twice a day. 5 inhalations at a time for mucous production --Continue mucinex 600 mg daily as needed. You can purchase this over-the-counter --Encourage regular aerobic activity 3-5 times a week for 30 minutes  Follow-up with me in one year

## 2022-01-03 NOTE — Progress Notes (Signed)
Subjective:   PATIENT ID: Anna York GENDER: female DOB: Jan 06, 1941, MRN: [id number]  Chief Complaint  Patient presents with   Follow-up    Copd no updates or concerns    Reason for Visit: Follow-up  Anna York is 81 year old with COPD, emphysema, osteoporosis, TIA, hx breast cancer who presents for follow-upl  Initial consult She was previously followed by Dr. Lamonte Sakai for COPD. She is compliant with Trelegy daily. She continues to have mucous production especially after her pneumonia in the summer. Denies shortness of breath or wheezing.   She recently was seen by CTS for abnormal CT with bilateral pulmonary infiltrates. Earlier this summer she was treated for pneumonia and had a CXR demonstrating right upper lobe and left lower lobe abnormalities. Follow-up CT and PET/CT were ordered and demonstrated right upper lobe atelectasis and left lower lobe consolidation - that was improved on latest scan.  01/13/22 Stable on Trelegy.  Flutter valve has been helping with mucus production.  Has not yet picked up Mucinex.  COPD otherwise is well-controlled, denies shortness of breath or wheezing.  No limitation in activity.  She was recently seen by Dr. Roxan Hockey cardiothoracic clinic.  Note reviewed from 12/12/2021.  Social History: Former smoker. 30 pack- years. Quit >20 years ago.  Past Medical History:  Diagnosis Date   ASCUS (atypical squamous cells of undetermined significance) on Pap smear    Neg high risk HPV   Asthma    Atrophic vaginitis    Backache, unspecified    Breast cancer ([place]) 1992   Chemo and Radiation   Constipation    COPD (chronic obstructive pulmonary disease) (HCC)    Cystitis, unspecified    Diverticulosis    Dysphagia    GERD (gastroesophageal reflux disease)    indigestion   Headache(784.0)    Lymphocytic colitis    Osteoarthritis    Osteopenia 2012   T score -2.1   Osteoporosis    Palpitations    Personal history of colonic  polyps 07/07/2002   hyperplastic   Personal history of malignant neoplasm of breast    Pneumonia    Stroke ([place]) 2010   2 TIA's    TIA (transient ischemic attack)    Unspecified transient cerebral ischemia      Family History  Problem Relation Age of Onset   Breast cancer Mother        Age 70   Hypertension Father      Social History   Occupational History   Occupation: Cabin crew    Comment: [employer]    Employer: [employer]  Tobacco Use   Smoking status: Former    Packs/day: 1.00    Years: 30.00    Total pack years: 30.00    Types: Cigarettes    Quit date: 02/20/2000    Years since quitting: 21.8   Smokeless tobacco: Never  Vaping Use   Vaping Use: Never used  Substance and Sexual Activity   Alcohol use: Yes    Alcohol/week: 14.0 standard drinks of alcohol    Types: 14 Glasses of wine per week    Comment: daily   Drug use: No   Sexual activity: Not Currently    Birth control/protection: Post-menopausal    Comment: 1st intercourse 81 yo-Fewer than 5 partners    Allergies  Allergen Reactions   Erythromycin Nausea And Vomiting   Macrobid [Nitrofurantoin] Hives and Rash     Outpatient Medications Prior to Visit  Medication Sig Dispense Refill  albuterol (VENTOLIN HFA) 108 (90 Base) MCG/ACT inhaler Inhale 2 puffs into the lungs every 6 (six) hours as needed. 18 g 5   anastrozole (ARIMIDEX) 1 MG tablet Take 1 tablet (1 mg total) by mouth daily. 90 tablet 3   Cholecalciferol (VITAMIN D) 2000 units CAPS Take 2,000 Units by mouth daily.     clopidogrel (PLAVIX) 75 MG tablet Take 75 mg by mouth daily.     Fluticasone-Umeclidin-Vilant (TRELEGY ELLIPTA) 100-62.5-25 MCG/ACT AEPB INHALE ONE PUFF INTO THE LUNGS DAILY 60 each 6   ibandronate (BONIVA) 150 MG tablet Take 150 mg by mouth every 30 (thirty) days. Take in the morning with a full glass of water, on an empty stomach, and do not take anything else by mouth or lie down for the next 30 min.     metoprolol succinate  (TOPROL-XL) 25 MG 24 hr tablet Take 12.5 mg by mouth daily.     nystatin (MYCOSTATIN) 100000 UNIT/ML suspension      omeprazole (PRILOSEC) 20 MG capsule Take 20 mg by mouth daily.     TRELEGY ELLIPTA 100-62.5-25 MCG/INH AEPB INHALE ONE PUFF INTO THE LUNGS DAILY (Patient not taking: Reported on 01/03/2022) 60 each 11   No facility-administered medications prior to visit.    Review of Systems  Constitutional:  Negative for chills, diaphoresis, fever, malaise/fatigue and weight loss.  HENT:  Negative for congestion.   Respiratory:  Positive for sputum production. Negative for cough, hemoptysis, shortness of breath and wheezing.   Cardiovascular:  Negative for chest pain, palpitations and leg swelling.     Objective:   Vitals:   01/03/22 1508  BP: 128/70  Pulse: 88  SpO2: 95%  Weight: 112 lb 10.5 oz (51.1 kg)  Height: '5\' 4"'$  (1.626 m)   SpO2: 95 % O2 Device: None (Room air)  Physical Exam: General: Well-appearing, no acute distress HENT: [place], AT Eyes: EOMI, no scleral icterus Respiratory: Clear to auscultation bilaterally.  No crackles, wheezing or rales Cardiovascular: RRR, -M/R/G, no JVD Extremities:-Edema,-tenderness Neuro: AAO x4, CNII-XII grossly intact Psych: Normal mood, normal affect   Data Reviewed:  Imaging: CT Chest 10/09/21 - RUL subsegmental atelectasis. Right hilar opacity measuring 3.1 x 1.4 cm. LLL consolidation. Emphysema with upper lobe predominance. LLL with honeycombed appearance vs interstitial thickening of previously seen emphysema in 03/02/17 PET/CT 10/19/21 - Partial right upper lobe collapse. Interval left lower lobe consolidation improved compared to prior CT 10/09/21 CT Chest 12/12/21 - Decrease in RUL opacity, unchanged RLL scarring. Near resolution of LLL consdolidation. Improved mediastinal adenopathy  PFT: 03/26/17 FVC 2.63 (93%) FEV1 1.41 (67%) Ratio 54  TLC 120% DLCO not complete Interpretation: Moderate obstructive defect with air trapping and  hyperinflation  Labs: CBC    Component Value Date/Time   WBC 5.1 01/21/2018 1403   WBC 4.4 01/15/2017 1427   WBC 6.0 08/09/2016 0602   RBC 4.26 01/21/2018 1403   HGB 13.6 01/21/2018 1403   HGB 13.5 01/15/2017 1427   HCT 42.3 01/21/2018 1403   HCT 40.9 01/15/2017 1427   PLT 240 01/21/2018 1403   PLT 232 01/15/2017 1427   MCV 99.3 01/21/2018 1403   MCV 95.1 01/15/2017 1427   MCH 31.9 01/21/2018 1403   MCHC 32.2 01/21/2018 1403   RDW 13.6 01/21/2018 1403   RDW 13.8 01/15/2017 1427   LYMPHSABS 0.7 01/21/2018 1403   LYMPHSABS 0.5 (L) 01/15/2017 1427   MONOABS 0.7 01/21/2018 1403   MONOABS 0.6 01/15/2017 1427   EOSABS 0.0 01/21/2018 1403  EOSABS 0.0 01/15/2017 1427   BASOSABS 0.1 01/21/2018 1403   BASOSABS 0.0 01/15/2017 1427   Absolute eosinophils - 0 01/2018     Assessment & Plan:   Discussion: 81 year old with COPD, emphysema, osteoporosis, TIA, hx breast cancer who presents for follow-up. Emphysema overall well controlled with breakthrough mucous production likely post-infectious. Reviewed CT scan which is consistent with improving infection. No findings consistent with ILD through COPD/emphysema is severe.  Resolving pneumonia --CT Chest ordered by CT Surgery. Follow-up scheduled. No need to see pulmonary for this  Emphysema --REFILL Trelegy ONE puff ONCE a day --Continue flutter valve. Use this twice a day. 5 inhalations at a time for mucous production --Continue mucinex 600 mg daily as needed. You can purchase this over-the-counter  Health Maintenance Immunization History  Administered Date(s) Administered   Influenza Split 11/20/2010, 12/10/2011, 11/19/2012   Influenza, High Dose Seasonal PF 10/21/2018, 11/25/2021   Influenza,inj,Quad PF,6+ Mos 11/19/2014   Influenza,inj,quad, With Preservative 11/19/2016   Influenza-Unspecified 12/03/2016   PFIZER(Purple Top)SARS-COV-2 Vaccination 03/10/2019, 03/28/2019, 11/04/2019   Pneumococcal Polysaccharide-23  02/19/2010   Pneumococcal-Unspecified 02/20/2015   Zoster Recombinat (Shingrix) 05/02/2018, 08/11/2018   Zoster, Live 02/20/2007   CT Lung Screen - not qualified due to age.  No orders of the defined types were placed in this encounter.  Meds ordered this encounter  Medications   Fluticasone-Umeclidin-Vilant (TRELEGY ELLIPTA) 100-62.5-25 MCG/ACT AEPB    Sig: INHALE ONE PUFF INTO THE LUNGS DAILY    Dispense:  60 each    Refill:  11   albuterol (VENTOLIN HFA) 108 (90 Base) MCG/ACT inhaler    Sig: Inhale 2 puffs into the lungs every 6 (six) hours as needed.    Dispense:  18 g    Refill:  2    Return in about 1 year (around 01/04/2023).   I have spent a total time of 32-minutes on the day of the appointment reviewing prior documentation, coordinating care and discussing medical diagnosis and plan with the patient/family. Past medical history, allergies, medications were reviewed. Pertinent imaging, labs and tests included in this note have been reviewed and interpreted independently by me.  Vignesh Willert Rodman Pickle, MD [facility] Office Number [phone]

## 2022-01-22 DIAGNOSIS — H353221 Exudative age-related macular degeneration, left eye, with active choroidal neovascularization: Secondary | ICD-10-CM | POA: Diagnosis not present

## 2022-01-29 DIAGNOSIS — H903 Sensorineural hearing loss, bilateral: Secondary | ICD-10-CM | POA: Diagnosis not present

## 2022-01-31 ENCOUNTER — Other Ambulatory Visit: Payer: Self-pay | Admitting: Thoracic Surgery (Cardiothoracic Vascular Surgery)

## 2022-01-31 DIAGNOSIS — R918 Other nonspecific abnormal finding of lung field: Secondary | ICD-10-CM

## 2022-02-05 DIAGNOSIS — R399 Unspecified symptoms and signs involving the genitourinary system: Secondary | ICD-10-CM | POA: Diagnosis not present

## 2022-02-15 NOTE — Progress Notes (Signed)
Patient Care Team: Crist Infante, MD as PCP - General (Internal Medicine) Anna Overall, MD as Consulting Physician (General Surgery) Magrinat, [name], MD (Inactive) as Consulting Physician (Oncology) Kyung Rudd, MD as Consulting Physician (Radiation Oncology) Delice Bison Charlestine Massed, NP as Nurse Practitioner (Hematology and Oncology)  DIAGNOSIS:  Encounter Diagnosis  Name Primary?   Malignant neoplasm of upper-outer quadrant of right breast in female, estrogen receptor positive ([place]) Yes    SUMMARY OF ONCOLOGIC HISTORY: Oncology History  Malignant neoplasm of upper-outer quadrant of right breast in female, estrogen receptor positive ([place])  1991 Initial Biopsy   Left Breast cancer treated with breast conserving surgery followed by chemotherapy and radiation, unknown receptor status and unknown chemotherapy regimen   07/18/2016 Initial Diagnosis   Right breast calcifications 4 mm size, biopsy revealed DCIS with calcifications low intermediate grade ER 100%, PR 50%, Tis Nx stage 0   08/09/2016 Surgery   Right lumpectomy: 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, with DCIS with calcifications, ER 100%, PR 95%, Ki-67 3%, HER-2 positive ratio 2.3, T1c Nx stage IA   09/11/2016 -  Chemotherapy   Herceptin every 3 weeks  1 year    10/31/2016 - 11/28/2016 Radiation Therapy   Adjuvant Radiation [facility]): Right breast / 42.56 Gy in 16 fractions  Boost: 10 Gy in 4 fractions     02/26/2017 -  Anti-estrogen oral therapy   Anastrozole 1 mg daily     CHIEF COMPLIANT: Follow-up of right breast cancer on anastrozole therapy    INTERVAL HISTORY: Anna York is a 81 y.o. with above-mentioned history of right breast cancer treated with lumpectomy, radiation, Herceptin maintenance, and who is currently on anti-estrogen therapy with anastrozole. Mammogram on 07/21/20 showed no evidence of malignancy bilaterally. She presents to the clinic today for follow-up.     ALLERGIES:  is allergic to  erythromycin and macrobid [nitrofurantoin].  MEDICATIONS:  Current Outpatient Medications  Medication Sig Dispense Refill   albuterol (VENTOLIN HFA) 108 (90 Base) MCG/ACT inhaler Inhale 2 puffs into the lungs every 6 (six) hours as needed. 18 g 2   anastrozole (ARIMIDEX) 1 MG tablet Take 1 tablet (1 mg total) by mouth daily. 90 tablet 3   Cholecalciferol (VITAMIN D) 2000 units CAPS Take 2,000 Units by mouth daily.     clopidogrel (PLAVIX) 75 MG tablet Take 75 mg by mouth daily.     Fluticasone-Umeclidin-Vilant (TRELEGY ELLIPTA) 100-62.5-25 MCG/ACT AEPB INHALE ONE PUFF INTO THE LUNGS DAILY 60 each 11   ibandronate (BONIVA) 150 MG tablet Take 150 mg by mouth every 30 (thirty) days. Take in the morning with a full glass of water, on an empty stomach, and do not take anything else by mouth or lie down for the next 30 min.     metoprolol succinate (TOPROL-XL) 25 MG 24 hr tablet Take 12.5 mg by mouth daily.     nystatin (MYCOSTATIN) 100000 UNIT/ML suspension      omeprazole (PRILOSEC) 20 MG capsule Take 20 mg by mouth daily.     No current facility-administered medications for this visit.    PHYSICAL EXAMINATION: ECOG PERFORMANCE STATUS: 1 - Symptomatic but completely ambulatory  Vitals:   02/20/22 1416  BP: (!) 158/67  Pulse: 92  Temp: 99.6 F (37.6 C)  SpO2: 99%   Filed Weights   02/20/22 1416  Weight: 114 lb 8 oz (51.9 kg)    BREAST: No palpable masses or nodules in either right or left breasts. No palpable axillary supraclavicular or infraclavicular adenopathy no  breast tenderness or nipple discharge. (exam performed in the presence of a chaperone)  LABORATORY DATA:  I have reviewed the data as listed    Latest Ref Rng & Units 01/09/2021    7:05 PM 01/21/2018    2:03 PM 09/11/2017   10:58 AM  CMP  Glucose 70 - 99 mg/dL  111  90   BUN 8 - 23 mg/dL  11  15   Creatinine 0.44 - 1.00 mg/dL 0.90  0.87  0.82   Sodium 135 - 145 mmol/L  144  139   Potassium 3.5 - 5.1 mmol/L  4.1   4.3   Chloride 98 - 111 mmol/L  103  104   CO2 22 - 32 mmol/L  26  25   Calcium 8.9 - 10.3 mg/dL  9.7  9.8   Total Protein 6.5 - 8.1 g/dL  7.1  7.0   Total Bilirubin 0.3 - 1.2 mg/dL  0.7  0.5   Alkaline Phos 38 - 126 U/L  92  89   AST 15 - 41 U/L  17  19   ALT 0 - 44 U/L  12  17     Lab Results  Component Value Date   WBC 5.1 01/21/2018   HGB 13.6 01/21/2018   HCT 42.3 01/21/2018   MCV 99.3 01/21/2018   PLT 240 01/21/2018   NEUTROABS 3.6 01/21/2018    ASSESSMENT & PLAN:  Malignant neoplasm of upper-outer quadrant of right breast in female, estrogen receptor positive ([place]) 08/09/2016 Right lumpectomy: Grade 1 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 adjuvant radiation 10/31/16-11/28/16 3. antiestrogen therapy with anastrozole started 01/17/17 ---------------------------------------------------------------------------- Current treatment:  Anastrozole Echo 09/04/17: EF 55-60%, follows with Dr. 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  Follows Dr. Lamonte Sakai.   Anastrozole Toxicities: mild hot flashes With this she completes a 5 years of letrozole therapy.  I recommended that we can stop her antiestrogen therapy at this time.   Breast cancer surveillance: 1.  Breast exam 02/20/2022: Benign 2. mammogram 07/24/2021: [name]: Benign breast density category A 3.  Bone density [name] 10/28/2018 T score -1.8: Osteopenia continue with calcium and vitamin D, on Boniva   Lung issues: Following with pulmonary.  CT scans showed evidence of resolving pneumonia.  PET CT scan CT scans were being done.  Last CT done on 12/12/2021: Decrease in size of the right upper lobe consolidation (5.4 cm) interval near resolution of 4.7  cm left lower lobe opacity  Knee pain: Related to loss of cartilage.  Seeing orthopedics.  Return to clinic on an as-needed basis.   No orders of the defined types were placed in this encounter.  The patient has a good understanding of the York plan. she agrees with it. she will call with any problems that may develop before the next visit here. Total time spent: 30 mins including face to face time and time spent for planning, charting and co-ordination of care   Harriette Ohara, MD 02/20/22    I Gardiner Coins am acting as a Education administrator for [employer]  I have reviewed the above documentation for accuracy and completeness, and I agree with the above.

## 2022-02-20 ENCOUNTER — Other Ambulatory Visit: Payer: Self-pay

## 2022-02-20 ENCOUNTER — Inpatient Hospital Stay: Payer: PPO | Attending: Hematology and Oncology | Admitting: Hematology and Oncology

## 2022-02-20 VITALS — BP 158/67 | HR 92 | Temp 99.6°F | Wt 114.5 lb

## 2022-02-20 DIAGNOSIS — M25569 Pain in unspecified knee: Secondary | ICD-10-CM | POA: Insufficient documentation

## 2022-02-20 DIAGNOSIS — C50411 Malignant neoplasm of upper-outer quadrant of right female breast: Secondary | ICD-10-CM

## 2022-02-20 DIAGNOSIS — Z79811 Long term (current) use of aromatase inhibitors: Secondary | ICD-10-CM | POA: Insufficient documentation

## 2022-02-20 DIAGNOSIS — J439 Emphysema, unspecified: Secondary | ICD-10-CM | POA: Diagnosis not present

## 2022-02-20 DIAGNOSIS — Z170 Estrogen receptor positive status [ER+]: Secondary | ICD-10-CM

## 2022-02-20 DIAGNOSIS — D0511 Intraductal carcinoma in situ of right breast: Secondary | ICD-10-CM | POA: Diagnosis not present

## 2022-02-20 NOTE — Assessment & Plan Note (Signed)
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 adjuvant radiation 10/31/16-11/28/16 3. antiestrogen therapy with anastrozole started 01/17/17 ---------------------------------------------------------------------------- Current treatment:  Anastrozole Echo 09/04/17: EF 55-60%, follows with Dr. 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  Follows Dr. Lamonte Sakai.   Anastrozole Toxicities: mild hot flashes With this she completes a 5 years of letrozole therapy.  I recommended that we can stop her antiestrogen therapy at this time.   Breast cancer surveillance: 1.  Breast exam 02/20/2022: Benign 2. mammogram 07/24/2021: [name]: Benign breast density category A 3.  Bone density [name] 10/28/2018 T score -1.8: Osteopenia continue with calcium and vitamin D, on Boniva   Lung issues: Following with pulmonary.  CT scans showed evidence of resolving pneumonia.  PET CT scan CT scans were being done.  Last CT done on 12/12/2021: Decrease in size of the right upper lobe consolidation (5.4 cm) interval near resolution of 4.7 cm left lower lobe opacity  Knee pain: Related to loss of cartilage.  Seeing orthopedics.  Return to clinic in 1 year for follow-up

## 2022-02-27 DIAGNOSIS — R197 Diarrhea, unspecified: Secondary | ICD-10-CM | POA: Diagnosis not present

## 2022-02-27 DIAGNOSIS — K52832 Lymphocytic colitis: Secondary | ICD-10-CM | POA: Diagnosis not present

## 2022-02-28 ENCOUNTER — Encounter: Payer: Self-pay | Admitting: Internal Medicine

## 2022-03-05 DIAGNOSIS — H353221 Exudative age-related macular degeneration, left eye, with active choroidal neovascularization: Secondary | ICD-10-CM | POA: Diagnosis not present

## 2022-03-08 ENCOUNTER — Ambulatory Visit
Admission: RE | Admit: 2022-03-08 | Discharge: 2022-03-08 | Disposition: A | Discharge status: Home / Self Care | Payer: PPO | Source: Ambulatory Visit | Attending: Thoracic Surgery (Cardiothoracic Vascular Surgery) | Admitting: Thoracic Surgery (Cardiothoracic Vascular Surgery)

## 2022-03-08 DIAGNOSIS — I700 Atherosclerosis of aorta: Secondary | ICD-10-CM | POA: Diagnosis not present

## 2022-03-08 DIAGNOSIS — J439 Emphysema, unspecified: Secondary | ICD-10-CM | POA: Diagnosis not present

## 2022-03-08 DIAGNOSIS — R918 Other nonspecific abnormal finding of lung field: Secondary | ICD-10-CM

## 2022-03-13 ENCOUNTER — Encounter: Payer: Self-pay | Admitting: Thoracic Surgery (Cardiothoracic Vascular Surgery)

## 2022-03-13 ENCOUNTER — Ambulatory Visit: Payer: PPO | Admitting: Thoracic Surgery (Cardiothoracic Vascular Surgery)

## 2022-03-13 VITALS — BP 154/78 | HR 77 | Resp 20 | Ht 64.0 in | Wt 115.0 lb

## 2022-03-13 DIAGNOSIS — R918 Other nonspecific abnormal finding of lung field: Secondary | ICD-10-CM | POA: Diagnosis not present

## 2022-03-13 NOTE — Progress Notes (Signed)
[address]       [place] [address]             York     HPI: [name] returns for follow-up of her pulmonary opacities.  Anna York is an 82 year old woman with a history of tobacco abuse, COPD, multiple pneumonias, breast cancer, TIA, reflux, osteopenia, and arthritis.  She had pneumonia in August 2023.  A CT of the chest showed subsegmental atelectasis in the right upper lobe and a right hilar soft tissue opacity.  There also was consolidation in the lower lobe, hilar and mediastinal adenopathy, along with severe emphysema.  She had a PET/CT which showed partial clearing of the left upper lobe, but persistent consolidation of the right upper lobe with intermediate metabolic uptake.  There was no activity in a complex right lower lobe lesion or in the lymph nodes.  Again findings were felt to be related to infectious/inflammatory process rather than neoplasm.  I saw her back in October.  There was near complete resolution of the changes on the left side.  On the right there was improvement but some residual consolidation and nodularity.  She now returns for a follow-up scan.  She has been feeling well.  No acute respiratory issues in the interim since her last visit.  No change in appetite or weight loss.  Past Medical History:  Diagnosis Date   ASCUS (atypical squamous cells of undetermined significance) on Pap smear    Neg high risk HPV   Asthma    Atrophic vaginitis    Backache, unspecified    Breast cancer ([place]) 1992   Chemo and Radiation   Constipation    COPD (chronic obstructive pulmonary disease) (HCC)    Cystitis, unspecified    Diverticulosis    Dysphagia    GERD (gastroesophageal reflux disease)    indigestion   Headache(784.0)    Lymphocytic colitis    Osteoarthritis    Osteopenia 2012   T score -2.1   Osteoporosis    Palpitations    Personal history of colonic polyps 07/07/2002   hyperplastic   Personal history of  malignant neoplasm of breast    Pneumonia    Stroke ([place]) 2010   2 TIA's    TIA (transient ischemic attack)    Unspecified transient cerebral ischemia     Current Outpatient Medications  Medication Sig Dispense Refill   albuterol (VENTOLIN HFA) 108 (90 Base) MCG/ACT inhaler Inhale 2 puffs into the lungs every 6 (six) hours as needed. 18 g 2   Cholecalciferol (VITAMIN D) 2000 units CAPS Take 2,000 Units by mouth daily.     clopidogrel (PLAVIX) 75 MG tablet Take 75 mg by mouth daily.     Fluticasone-Umeclidin-Vilant (TRELEGY ELLIPTA) 100-62.5-25 MCG/ACT AEPB INHALE ONE PUFF INTO THE LUNGS DAILY 60 each 11   ibandronate (BONIVA) 150 MG tablet Take 150 mg by mouth every 30 (thirty) days. Take in the morning with a full glass of water, on an empty stomach, and do not take anything else by mouth or lie down for the next 30 min.     metoprolol succinate (TOPROL-XL) 25 MG 24 hr tablet Take 12.5 mg by mouth daily.     nystatin (MYCOSTATIN) 100000 UNIT/ML suspension      omeprazole (PRILOSEC) 20 MG capsule Take 20 mg by mouth daily.     No current facility-administered medications for this visit.    Physical Exam BP (!) 154/78 (BP Location: Left Arm,  Patient Position: Sitting)   Pulse 77   Resp 20   Ht '5\' 4"'$  (1.626 m)   Wt 115 lb (52.2 kg)   SpO2 96% Comment: RA  BMI 19.99 kg/m  82 year old woman in no acute distress Alert and oriented x 3 with no focal deficits Lungs diminished but clear breath sounds bilaterally Cardiac regular rate and rhythm No cervical or supraclavicular adenopathy  Diagnostic Tests: CT CHEST WITHOUT CONTRAST   TECHNIQUE: Multidetector CT imaging of the chest was performed following the standard protocol without IV contrast.   RADIATION DOSE REDUCTION: This exam was performed according to the departmental dose-optimization program which includes automated exposure control, adjustment of the mA and/or kV according to patient size and/or use of iterative  reconstruction technique.   COMPARISON:  CT 12/12/2021 and older.  PET-CT scan 10/19/2021.   FINDINGS: Cardiovascular: On this non IV contrast exam the heart is nonenlarged. No significant pericardial effusion. There is diffusely ectatic thoracic aorta. For example the ascending aorta has a diameter approaching 3.9 by 4.7 cm. The descending thoracic aorta on image 67 at the same level measures 4.3 x 3.6 cm. This is unchanged from previous when adjusting for technique.   Mediastinum/Nodes: Small hiatal hernia. Small thyroid gland. Surgical clips are identified in the left axillary region. No specific abnormal lymph node enlargement present in the axillary regions, hilum or mediastinum. A few small mediastinal nodes are seen which are not pathologic by size criteria at under 1 cm in short axis.   Lungs/Pleura: Advanced emphysematous lung changes are identified. There are scattered areas of peripheral interstitial septal thickening with scarring and fibrotic changes. Apical pleural thickening as well. The nodular parenchymal opacity in the dependent right upper lobe continues to decrease in size but there is some residual focal areas of nodularity. The entire area previously was measured at 5.4 x 2.1 cm. Today the residual area would measure approximately 3.6 x 1.8 cm on image 54. The discrete focal nodular component on image 53 of series 5 would measure 19 x 13 mm and a second area more superomedial on image 50 of the same dataset would measure 10 by 9 mm. These focal areas are better seen with a decreasing surrounding opacity. The ill-defined opacity with some cystic areas in the right lower lobe which previously had a oblique diameter of 2.7 cm, today on image 70 measures 2.8 cm. Not significantly changed when adjusting for technique. No new dominant nodule areas.   Upper Abdomen: In the upper abdomen the adrenal glands are grossly preserved. There is upper pole right-sided renal  cysts with some marginal calcification. This is incompletely included in the imaging field. Favor a benign process but recommend dedicated complete evaluation when appropriate.   Musculoskeletal: Scattered degenerative changes seen along the spine. Surgical changes in the area of the chest wall and breast tissue. Small presumed sebaceous cyst along the anterior upper chest left of midline on axial image 27 of series 5   IMPRESSION: Advanced emphysematous changes identified. The patchy confluent opacity in the dependent right upper lobe continues to decrease in overall size and extent with some residual underlying more focal areas of nodularity. Recommend continued follow-up surveillance in 3 months based on size and appearance. Repeat PET-CT as clinically directed.   Ill-defined density with cavitary change in the right lower lobe is stable.   Advanced emphysematous changes with scarring and atelectatic changes.   Stable ectasia of the thoracic aorta.   Minimally complex cystic lesion along the upper pole  of the right kidney at the very edge of the imaging field incompletely evaluated today. Recommend dedicated evaluation when appropriate.   Aortic Atherosclerosis (ICD10-I70.0) and Emphysema (ICD10-J43.9).     Electronically Signed   By: Jill Side M.D.   On: 03/08/2022 14:45 I personally reviewed the CT images.  Stable right lower lobe architectural changes.  Continued resolution of right upper lobe consolidation.  Focal nodular areas same or smaller but more apparent due to decreased surrounding consolidation.  Aorta measures approximately 3.7 cm.  The measurements in the official report are clearly in a tangential plane based on the lateral views and are not accurate.  Impression: Anna York is an 82 year old woman with a history of tobacco abuse, COPD, multiple pneumonias, breast cancer, TIA, reflux, osteopenia, and arthritis.    Pulmonary opacities seen on  CT-had pneumonia back in August.  Had severe consolidation.  Left lower lobe has much resolved.  Still some residual nodularity in the right upper lobe, although I think the nodular areas are slightly smaller than they were previously.  Still cannot rule out the possibility of an underlying malignancy, although in my opinion less likely based on progression on sequential scans.  Also has an unusual area of architectural distortion in the right lower lobe that was not hypermetabolic on PET/CT, but still warrants follow-up.  Thoracic aortic ectasia-stable.  Aorta measures about 3.8 cm.  A little large for a lady this size but she is 82 years old.  There is mild thoracic aortic atherosclerosis as well.  Plan: Return in 3 months with CT chest  I spent over 20 minutes in review of records, images, and in consultation with Anna York today. Melrose Nakayama, MD [facility] [phone]

## 2022-03-30 ENCOUNTER — Encounter: Payer: Self-pay | Admitting: *Deleted

## 2022-04-16 DIAGNOSIS — H353221 Exudative age-related macular degeneration, left eye, with active choroidal neovascularization: Secondary | ICD-10-CM | POA: Diagnosis not present

## 2022-04-18 DIAGNOSIS — H9313 Tinnitus, bilateral: Secondary | ICD-10-CM | POA: Diagnosis not present

## 2022-04-18 DIAGNOSIS — H903 Sensorineural hearing loss, bilateral: Secondary | ICD-10-CM | POA: Diagnosis not present

## 2022-04-27 ENCOUNTER — Ambulatory Visit: Payer: PPO | Admitting: Internal Medicine

## 2022-04-27 ENCOUNTER — Encounter: Payer: Self-pay | Admitting: Internal Medicine

## 2022-04-27 VITALS — BP 130/68 | HR 81 | Ht 64.0 in | Wt 116.0 lb

## 2022-04-27 DIAGNOSIS — R195 Other fecal abnormalities: Secondary | ICD-10-CM | POA: Diagnosis not present

## 2022-04-27 DIAGNOSIS — R1033 Periumbilical pain: Secondary | ICD-10-CM | POA: Diagnosis not present

## 2022-04-27 DIAGNOSIS — K52839 Microscopic colitis, unspecified: Secondary | ICD-10-CM

## 2022-04-27 MED ORDER — MESALAMINE 1.2 G PO TBEC: 1.2000 g | DELAYED_RELEASE_TABLET | Freq: Every day | ORAL | 0 refills | Status: DC

## 2022-04-27 NOTE — Patient Instructions (Signed)
_______________________________________________________  If your blood pressure at your visit was 140/90 or greater, please contact your primary care physician to follow up on this.  _______________________________________________________  If you are age 81 or older, your body mass index should be between 23-30. Your Body mass index is 19.91 kg/m. If this is out of the aforementioned range listed, please consider follow up with your Primary Care Provider.  If you are age 3 or younger, your body mass index should be between 19-25. Your Body mass index is 19.91 kg/m. If this is out of the aformentioned range listed, please consider follow up with your Primary Care Provider.   ________________________________________________________  The [facility] providers would like to encourage you to use [facility] to communicate with providers for non-urgent requests or questions.  Due to long hold times on the telephone, sending your provider a message by [facility] may be a faster and more efficient way to get a response.  Please allow 48 business hours for a response.  Please remember that this is for non-urgent requests.  _______________________________________________________  We have sent the following medications to your pharmacy for you to pick up at your convenience:  Lialda - take one a day for 3-4 weeks and then, provided your symptoms are better, stop.

## 2022-04-27 NOTE — Progress Notes (Signed)
Subjective:    Patient ID: Anna York, female    DOB: [birth year], 82 y.o.   MRN: [id number]  HPI Anna York is an 82 year old female with a history of lymphocytic colitis, colonic diverticulosis, breast cancer, COPD with multiple pneumonias and what is felt to be probable benign pulmonary lesions, who is seen to reestablish care after having acute diarrhea with abdominal discomfort several months ago.  She is here alone today.  I last saw her in 2019.  She reports that in late December early January she developed flatulence which was very foul-smelling with loose stools and urgent diarrhea at times.  She states this was worse postprandially.  She was not having abdominal pain but did occasionally have mid to lower abdominal discomfort that might wake her from sleep but lasted very short amount of time.  She was also having some fecal smearing with gas passing.  Largely now this has gone away.  She will have possibly a loose stool about once a week but between these times her stools are more normal.  She is not having flatulence or bloating symptom any longer.  She is a bit worried that they might come back though.  She has passed no blood in her stool or melena to her knowledge.  Her appetite has remained good.  Review of Systems As per HPI, otherwise negative  Current Medications, Allergies, Past Medical History, Past Surgical History, Family History and Social History were reviewed in [employer] record.    Objective:   Physical Exam BP 130/68   Pulse 81   Ht '5\' 4"'$  (1.626 m)   Wt 116 lb (52.6 kg)   BMI 19.91 kg/m  Gen: awake, alert, NAD HEENT: anicteric  CV: RRR, no mrg Abd: soft, NT/ND, +BS throughout Ext: no c/c/e Neuro: nonfocal  Stool heme-negative 12/04/2021 Hemoglobin 13.7, MCV 94.0, white count 5.22, platelet count 233 --October 2023  NUCLEAR MEDICINE PET SKULL BASE TO THIGH   TECHNIQUE: 5.88 mCi F-18 FDG was injected  intravenously. Full-ring PET imaging was performed from the skull base to thigh after the radiotracer. CT data was obtained and used for attenuation correction and anatomic localization.   Fasting blood glucose: 108 mg/dl   COMPARISON:  Chest CT 10/09/2021, abdominopelvic CT 01/09/2021 and chest CT 03/01/2017.   FINDINGS: Mediastinal blood pool activity: SUV max 2.1   NECK:   No hypermetabolic cervical lymph nodes are identified.Mildly asymmetric hypermetabolic activity within the left nasopharynx is without CT correlate (SUV max 4.5), likely physiologic. No suspicious activity associated with the pharyngeal mucosal space.   Incidental CT findings: none   CHEST:   No significant hypermetabolic activity associated with the previously demonstrated mildly prominent precarinal and subcarinal lymph nodes. There is no axillary adenopathy. There is persistent partial right upper lobe collapse with intermediate level hypermetabolic activity in the right suprahilar region (SUV max 6.4). No significant hypermetabolic activity within the patchy pulmonary opacities superiorly in the right lower lobe. Interval partial clearing of presumed left lower lobe airspace disease which currently measures 3.8 x 2.0 cm on image 36/7 (previously 4.7 x 3.7 cm). This is mildly hypermetabolic with an SUV max of 4.2.   Incidental CT findings: Severe centrilobular and paraseptal emphysema. Diffuse atherosclerosis of the aorta, great vessels and coronary arteries. No significant pleural or pericardial effusion.   ABDOMEN/PELVIS:   There is no hypermetabolic activity within the liver, adrenal glands, spleen or pancreas. There is no hypermetabolic nodal activity in the abdomen or pelvis.  Incidental CT findings: Stable simple appearing cyst in the upper pole the right kidney measuring 4.3 cm on image 102/4; no follow-up imaging recommended. Aortic and branch vessel atherosclerosis. Diverticular  changes throughout the distal colon and a small calcified uterine fibroid are noted.   SKELETON:   There is no hypermetabolic activity to suggest osseous metastatic disease.   Incidental CT findings: Convex right thoracolumbar scoliosis with associated multilevel spondylosis.   IMPRESSION: 1. Interval partial clearing of dense lower lobe consolidation, most consistent with resolving pneumonia. This demonstrates nonspecific low-level hypermetabolic activity. 2. Persistent partial right upper lobe collapse with associated nonspecific intermediate metabolic activity in the right suprahilar region. Given the contralateral findings, findings are favored to be infectious, although neoplasm is not completely excluded. 3. The previously noted prominent mediastinal lymph nodes are not hypermetabolic, likely reactive. 4. No suspicious hypermetabolic activity within the neck, abdomen or pelvis. 5. Chest CT follow-up (preferably with contrast) in 3 months recommended unless there is strong clinical suspicion of neoplasm, in which case bronchoscopy should be considered. 6. Coronary and aortic atherosclerosis (ICD10-I70.0). Emphysema (ICD10-J43.9).     Electronically Signed   By: Richardean Sale M.D.   On: 10/19/2021 10:59        Assessment & Plan:  82 year old female with a history of lymphocytic colitis, colonic diverticulosis, breast cancer, COPD with multiple pneumonias and what is felt to be probable benign pulmonary lesions, who is seen to reestablish care after having acute diarrhea with abdominal discomfort several months ago.   Acute diarrhea with gas and flatulence; now improved/history of lymphocytic colitis --my suspicion is that she had an acute gastroenteritis with either a bacterial or viral infection which is now resolved and she is having some minor postinfectious irritable bowel.  Given improvement this is not consistent with a flare of lymphocytic colitis.  She has had  some minor mid abdominal discomfort at night but not during the day and it has not affected appetite.  There is been no fever.  I do feel that she would benefit from low-dose Lialda for a few weeks which has helped her in the past.  Would reserve budesonide for return of inflammatory diarrhea -- Lialda 1.2 g daily for 3 weeks or so, stop thereafter -- She is asked to notify me if she has recurrent abdominal pain, diarrhea, gas, bloating  2.  Colon cancer screening --average risk patient with no polyp at last colonoscopy with Dr. Sharlett Iles in 2011.  At this time given her ongoing issues with her COPD and concern of pulmonary nodule/mass we will defer screening colonoscopy at this time.  This does not mean diagnostic colonoscopy would not be considered if needed in the future.  Her PET scan from a colonic standpoint was very reassuring in August 2023.  I would like to see her back in about 3 months to ensure resolution of symptoms

## 2022-04-30 ENCOUNTER — Other Ambulatory Visit: Payer: Self-pay | Admitting: Thoracic Surgery (Cardiothoracic Vascular Surgery)

## 2022-04-30 DIAGNOSIS — M25561 Pain in right knee: Secondary | ICD-10-CM | POA: Diagnosis not present

## 2022-04-30 DIAGNOSIS — R918 Other nonspecific abnormal finding of lung field: Secondary | ICD-10-CM

## 2022-05-08 NOTE — Patient Instructions (Signed)
SURGICAL WAITING ROOM VISITATION  Patients having surgery or a procedure may have no more than 2 support people in the waiting area - these visitors may rotate.    Children under the age of 62 must have an adult with them who is not the patient.  Due to an increase in RSV and influenza rates and associated hospitalizations, children ages 75 and under may not visit patients in [place].  If the patient needs to stay at the hospital during part of their recovery, the visitor guidelines for inpatient rooms apply. Pre-op nurse will coordinate an appropriate time for 1 support person to accompany patient in pre-op.  This support person may not rotate.    Please refer to the [facility] website for the visitor guidelines for Inpatients (after your surgery is over and you are in a regular room).       Your procedure is scheduled on:  05/22/22    Report to [facility] Main Entrance    Report to admitting at  0730 AM   Call this number if you have problems the morning of surgery [phone]   Do not eat food :After Midnight.   After Midnight you may have the following liquids until _ 0700_____ AM  DAY OF SURGERY  Water Non-Citrus Juices (without pulp, NO RED-Apple, White grape, White cranberry) Black Coffee (NO MILK/CREAM OR CREAMERS, sugar ok)  Clear Tea (NO MILK/CREAM OR CREAMERS, sugar ok) regular and decaf                             Plain Jell-O (NO RED)                                           Fruit ices (not with fruit pulp, NO RED)                                     Popsicles (NO RED)                                                               Sports drinks like Gatorade (NO RED)                     The day of surgery:  Drink ONE (1) Pre-Surgery Clear Ensure or G2 at   0700AM ( have completed by )  the morning of surgery. Drink in one sitting. Do not sip.  This drink was given to you during your hospital  pre-op appointment visit. Nothing else to  drink after completing the  Pre-Surgery Clear Ensure or G2.          If you have questions, please contact your surgeon's office.       Oral Hygiene is also important to reduce your risk of infection.                                    Remember - BRUSH YOUR TEETH THE MORNING OF  SURGERY WITH YOUR REGULAR TOOTHPASTE  DENTURES WILL BE REMOVED PRIOR TO SURGERY PLEASE DO NOT APPLY "Poly grip" OR ADHESIVES!!!   Do NOT smoke after Midnight   Take these medicines the morning of surgery with A SIP OF WATER:  inhalers as usual and bring, toprol, omeprazole   DO NOT TAKE ANY ORAL DIABETIC MEDICATIONS DAY OF YOUR SURGERY  Bring CPAP mask and tubing day of surgery.                              You may not have any metal on your body including hair pins, jewelry, and body piercing             Do not wear make-up, lotions, powders, perfumes/cologne, or deodorant  Do not wear nail polish including gel and S&S, artificial/acrylic nails, or any other type of covering on natural nails including finger and toenails. If you have artificial nails, gel coating, etc. that needs to be removed by a nail salon please have this removed prior to surgery or surgery may need to be canceled/ delayed if the surgeon/ anesthesia feels like they are unable to be safely monitored.   Do not shave  48 hours prior to surgery.               Men may shave face and neck.   Do not bring valuables to the hospital. [place].   Contacts, glasses, dentures or bridgework may not be worn into surgery.   Bring small overnight bag day of surgery.   DO NOT [place]. PHARMACY WILL DISPENSE MEDICATIONS LISTED ON YOUR MEDICATION LIST TO YOU DURING YOUR ADMISSION [place]!    Patients discharged on the day of surgery will not be allowed to drive home.  Someone NEEDS to stay with you for the first 24 hours after anesthesia.   Special  Instructions: Bring a copy of your healthcare power of attorney and living will documents the day of surgery if you haven't scanned them before.              Please read over the following fact sheets you were given: IF [place] [phone]   If you received a COVID test during your pre-op visit  it is requested that you wear a mask when out in public, stay away from anyone that may not be feeling well and notify your surgeon if you develop symptoms. If you test positive for Covid or have been in contact with anyone that has tested positive in the last 10 days please notify you surgeon.    [place] - Preparing for Surgery Before surgery, you can play an important role.  Because skin is not sterile, your skin needs to be as free of germs as possible.  You can reduce the number of germs on your skin by washing with CHG (chlorahexidine gluconate) soap before surgery.  CHG is an antiseptic cleaner which kills germs and bonds with the skin to continue killing germs even after washing. Please DO NOT use if you have an allergy to CHG or antibacterial soaps.  If your skin becomes reddened/irritated stop using the CHG and inform your nurse when you arrive at Short Stay. Do not shave (including legs and underarms) for at least  48 hours prior to the first CHG shower.  You may shave your face/neck. Please follow these instructions carefully:  1.  Shower with CHG Soap the night before surgery and the  morning of Surgery.  2.  If you choose to wash your hair, wash your hair first as usual with your  normal  shampoo.  3.  After you shampoo, rinse your hair and body thoroughly to remove the  shampoo.                           4.  Use CHG as you would any other liquid soap.  You can apply chg directly  to the skin and wash                       Gently with a scrungie or clean washcloth.  5.  Apply the CHG Soap to your body ONLY FROM THE NECK DOWN.   Do not use  on face/ open                           Wound or open sores. Avoid contact with eyes, ears mouth and genitals (private parts).                       Wash face,  Genitals (private parts) with your normal soap.             6.  Wash thoroughly, paying special attention to the area where your surgery  will be performed.  7.  Thoroughly rinse your body with warm water from the neck down.  8.  DO NOT shower/wash with your normal soap after using and rinsing off  the CHG Soap.                9.  Pat yourself dry with a clean towel.            10.  Wear clean pajamas.            11.  Place clean sheets on your bed the night of your first shower and do not  sleep with pets. Day of Surgery : Do not apply any lotions/deodorants the morning of surgery.  Please wear clean clothes to the hospital/surgery center.  FAILURE TO FOLLOW THESE INSTRUCTIONS MAY RESULT IN THE CANCELLATION OF YOUR SURGERY PATIENT SIGNATURE_________________________________  NURSE SIGNATURE__________________________________  ________________________________________________________________________

## 2022-05-08 NOTE — Progress Notes (Addendum)
Anesthesia Review:  PCP: Dr Joylene Draft- clearance 04/16/22 on chart LOV 11/29/21 on chart  Cardiologist : DR Modesto Charon LOV 03/13/22  Pulmonology- DR Morrison Old Loanne Drilling [place] 01/03/22  Chest x-ray : 10/09/21 on chart  EKG : 05/09/22  Echo : 2019  Ct Chest- 03/08/22  Stress test: Cardiac Cath :  Activity level: can do a flight of stairs without difficuty  Sleep Study/ CPAP : none  Fasting Blood Sugar :      / Checks Blood Sugar -- times a day:   Blood Thinner/ Instructions /Last Dose: ASA / Instructions/ Last Dose :    Plavix - PT states she has heard 2 different instructions regarding Plavix preop instructions.  At preop instructed pt to contact Dr Joylene Draft and DR Alvan Dame in regards to preop instructions prior to surgery.  Also on preop instructions.  PT voiced understanding.   PT called back on 05/11/22 and stated she has received preop instructions regarding Plavix and she is to hold for 7 days prior to surgery from MD>   Called pt back and confirmed with her that she stated on phone message that she was to hold Plavix for 7 days prior to procedure .  PT confirmed.    Pt with hx of frequent bouts of cystitis per pt. . Pt stated at preop episode of cystitis at time of preop.  Has call in to Dr Joylene Draft.    HOH- no hearing aids.

## 2022-05-09 ENCOUNTER — Other Ambulatory Visit: Payer: Self-pay

## 2022-05-09 ENCOUNTER — Encounter (HOSPITAL_COMMUNITY)
Admission: RE | Admit: 2022-05-09 | Discharge: 2022-05-09 | Disposition: A | Discharge status: Home / Self Care | Payer: PPO | Source: Ambulatory Visit | Attending: Orthopedic Surgery | Admitting: Orthopedic Surgery

## 2022-05-09 ENCOUNTER — Encounter (HOSPITAL_COMMUNITY): Payer: Self-pay

## 2022-05-09 VITALS — BP 163/82 | HR 83 | Temp 98.3°F | Resp 16 | Ht 64.0 in | Wt 112.0 lb

## 2022-05-09 DIAGNOSIS — Z01818 Encounter for other preprocedural examination: Secondary | ICD-10-CM | POA: Diagnosis not present

## 2022-05-09 HISTORY — DX: Cystitis, unspecified without hematuria: N30.90

## 2022-05-09 HISTORY — DX: Nonexudative age-related macular degeneration, left eye, intermediate dry stage: H35.3122

## 2022-05-09 HISTORY — DX: Dyspnea, unspecified: R06.00

## 2022-05-09 LAB — BASIC METABOLIC PANEL
Anion gap: 8 (ref 5–15)
BUN: 10 mg/dL (ref 8–23)
CO2: 24 mmol/L (ref 22–32)
Calcium: 8.9 mg/dL (ref 8.9–10.3)
Chloride: 104 mmol/L (ref 98–111)
Creatinine, Ser: 0.71 mg/dL (ref 0.44–1.00)
GFR, Estimated: >60 mL/min (ref >60)
Glucose, Bld: 103 mg/dL — ABNORMAL HIGH (ref 70–99)
Potassium: 4 mmol/L (ref 3.5–5.1)
Sodium: 136 mmol/L (ref 135–145)

## 2022-05-09 LAB — CBC
HCT: 41.8 % (ref 36.0–46.0)
Hemoglobin: 13.4 g/dL (ref 12.0–15.0)
MCH: 32.2 pg (ref 26.0–34.0)
MCHC: 32.1 g/dL (ref 30.0–36.0)
MCV: 100.5 fL — ABNORMAL HIGH (ref 80.0–100.0)
Platelets: 232 10*3/uL (ref 150–400)
RBC: 4.16 MIL/uL (ref 3.87–5.11)
RDW: 13.9 % (ref 11.5–15.5)
WBC: 7 10*3/uL (ref 4.0–10.5)
nRBC: 0 % (ref 0.0–0.2)

## 2022-05-09 LAB — SURGICAL PCR SCREEN
MRSA, PCR: NEGATIVE
Staphylococcus aureus: NEGATIVE

## 2022-05-15 DIAGNOSIS — H353221 Exudative age-related macular degeneration, left eye, with active choroidal neovascularization: Secondary | ICD-10-CM | POA: Diagnosis not present

## 2022-05-15 NOTE — Progress Notes (Signed)
Anesthesia Chart Review   Case: [id number] Date/Time: 05/22/22 0950   Procedure: TOTAL KNEE ARTHROPLASTY (Right: Knee)   Anesthesia type: Spinal   Pre-op diagnosis: Right knee osteoarthritis   Location: [facility] ROOM 09 / [facility]   Surgeons: Paralee Cancel, MD       DISCUSSION:82 y.o. former smoker with h/o COPD, asthma, TIAs, breast cancer s/p chemo and radiation, right knee OA scheduled for above procedure 05/22/22 with Dr. Paralee Cancel.   Clearance from PCP received which states pt is low risk and may hold Plavix 7 days prior to procedure.   Pt had pneumonia in August of 2023, has followed with pulmonology, stable at last visit.  She was also seen by cardiothoracic surgery. Last seen 03/13/22, no acute respiratory issues at that time. Per OV note, "Pulmonary opacities seen on CT-had pneumonia back in August.  Had severe consolidation.  Left lower lobe has much resolved.  Still some residual nodularity in the right upper lobe, although I think the nodular areas are slightly smaller than they were previously.  Still cannot rule out the possibility of an underlying malignancy, although in my opinion less likely based on progression on sequential scans.  Also has an unusual area of architectural distortion in the right lower lobe that was not hypermetabolic on PET/CT, but still warrants follow-up. Thoracic aortic ectasia-stable.  Aorta measures about 3.8 cm.  A little large for a lady this size but she is 82 years old.  There is mild thoracic aortic atherosclerosis as well."  Anticipate pt can proceed with planned procedure barring acute status change.   VS: BP (!) 163/82   Pulse 83   Temp 36.8 C (Oral)   Resp 16   Ht 5\' 4"  (1.626 m)   Wt 50.8 kg   SpO2 94%   BMI 19.22 kg/m   PROVIDERS: Crist Infante, MD is PCP    LABS: Labs reviewed: Acceptable for surgery. (all labs ordered are listed, but only abnormal results are displayed)  Labs Reviewed  CBC - Abnormal; Notable for the following  components:      Result Value   MCV 100.5 (*)    All other components within normal limits  BASIC METABOLIC PANEL - Abnormal; Notable for the following components:   Glucose, Bld 103 (*)    All other components within normal limits  SURGICAL PCR SCREEN     IMAGES:   EKG:   CV: Echo 03/25/2017 - Left ventricle: The cavity size was normal. Systolic function was    normal. The estimated ejection fraction was in the range of 55%    to 60%. Wall motion was normal; there were no regional wall    motion abnormalities.  - Aortic valve: There was mild regurgitation.  - Mitral valve: There was mild regurgitation.   Impressions:   - Global left ventricular longitudinal strain has decreased since    the previous study. Borderline low global left ventricular strain    -17.5%. Equivocal findings for diastolic dysfunction.  Past Medical History:  Diagnosis Date   ASCUS (atypical squamous cells of undetermined significance) on Pap smear    Neg high risk HPV   Asthma    Atrophic vaginitis    Backache, unspecified    Breast cancer ([place]) 1992   Chemo and Radiation   Constipation    COPD (chronic obstructive pulmonary disease) (HCC)    Cystitis    hx of frequent cystitis per pt at preop on 05/09/22.   Cystitis, unspecified  Diverticulosis    Dysphagia    Dyspnea    with exertion   GERD (gastroesophageal reflux disease)    indigestion   Intermediate stage nonexudative age-related macular degeneration of left eye    Lymphocytic colitis    Osteoarthritis    Osteopenia 2012   T score -2.1   Osteoporosis    Palpitations    Personal history of colonic polyps 07/07/2002   hyperplastic   Personal history of malignant neoplasm of breast    Pneumonia    Stroke ([place]) 2010   2 TIA's    TIA (transient ischemic attack)    Unspecified transient cerebral ischemia     Past Surgical History:  Procedure Laterality Date   BREAST BIOPSY     BREAST LUMPECTOMY     chemo and radiation    BREAST LUMPECTOMY WITH RADIOACTIVE SEED LOCALIZATION Right 08/09/2016   Procedure: RIGHT BREAST LUMPECTOMY WITH RADIOACTIVE SEED LOCALIZATION;  Surgeon: [name], MD;  Location: [facility];  Service: General;  Laterality: Right;   CARDIAC CATHETERIZATION     CATARACT EXTRACTION  2014   COLONOSCOPY  2011   diverticulosis   COLPOSCOPY     FACIAL COSMETIC SURGERY     LYMPHADENECTOMY  1993   TONSILLECTOMY      MEDICATIONS:  acetaminophen (TYLENOL) 500 MG tablet   albuterol (VENTOLIN HFA) 108 (90 Base) MCG/ACT inhaler   Cholecalciferol (VITAMIN D) 2000 units CAPS   clopidogrel (PLAVIX) 75 MG tablet   dextromethorphan-guaiFENesin (MUCINEX DM) 30-600 MG 12hr tablet   Fluticasone-Umeclidin-Vilant (TRELEGY ELLIPTA) 100-62.5-25 MCG/ACT AEPB   ibandronate (BONIVA) 150 MG tablet   mesalamine (LIALDA) 1.2 g EC tablet   metoprolol succinate (TOPROL-XL) 25 MG 24 hr tablet   omeprazole (PRILOSEC) 20 MG capsule   rosuvastatin (CRESTOR) 5 MG tablet   No current facility-administered medications for this encounter.    Konrad Felix Ward, PA-C [facility] Pre-Surgical Testing [phone]

## 2022-05-18 NOTE — H&P (Signed)
TOTAL KNEE ADMISSION H&P  Patient is being admitted for right total knee arthroplasty.  Subjective:  Chief Complaint:right knee pain.  HPI: Anna York, 82 y.o. female, has a history of pain and functional disability in the right knee due to arthritis and has failed non-surgical conservative treatments for greater than 12 weeks to includeNSAID's and/or analgesics, corticosteriod injections, and activity modification.  Onset of symptoms was gradual, starting 2 years ago with gradually worsening course since that time. The patient noted no past surgery on the right knee(s).  Patient currently rates pain in the right knee(s) at 8 out of 10 with activity. Patient has worsening of pain with activity and weight bearing and pain with passive range of motion.  Patient has evidence of joint space narrowing by imaging studies.  There is no active infection.  Patient Active Problem List   Diagnosis Date Noted   Abnormal CT scan, lung 11/20/2021   Dyspnea on exertion 11/12/2016   Malignant neoplasm of upper-outer quadrant of right breast in female, estrogen receptor positive ([place]) 07/25/2016   Palate abnormality 05/17/2015   COPD (chronic obstructive pulmonary disease) ([place]) 10/04/2011   Atrophic vaginitis    Osteoporosis    ASCUS (atypical squamous cells of undetermined significance) on Pap smear    Cancer ([place])    COLITIS 01/05/2010   DIARRHEA 11/01/2009   TIA 10/28/2009   CONSTIPATION 10/28/2009   CYSTITIS 10/28/2009   OSTEOARTHRITIS 10/28/2009   BACK PAIN 10/28/2009   OSTEOPENIA 10/28/2009   HEADACHE 10/28/2009   PALPITATIONS 10/28/2009   OTHER DYSPHAGIA 10/28/2009   NEOPLASM, MALIGNANT, BREAST, HX OF 10/28/2009   COLONIC POLYPS, HYPERPLASTIC, HX OF 10/28/2009   Past Medical History:  Diagnosis Date   ASCUS (atypical squamous cells of undetermined significance) on Pap smear    Neg high risk HPV   Asthma    Atrophic vaginitis    Backache, unspecified    Breast cancer ([place])  1992   Chemo and Radiation   Constipation    COPD (chronic obstructive pulmonary disease) (HCC)    Cystitis    hx of frequent cystitis per pt at preop on 05/09/22.   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 eye    Lymphocytic colitis    Osteoarthritis    Osteopenia 2012   T score -2.1   Osteoporosis    Palpitations    Personal history of colonic polyps 07/07/2002   hyperplastic   Personal history of malignant neoplasm of breast    Pneumonia    Stroke ([place]) 2010   2 TIA's    TIA (transient ischemic attack)    Unspecified transient cerebral ischemia     Past Surgical History:  Procedure Laterality Date   BREAST BIOPSY     BREAST LUMPECTOMY     chemo and radiation   BREAST LUMPECTOMY WITH RADIOACTIVE SEED LOCALIZATION Right 08/09/2016   Procedure: RIGHT BREAST LUMPECTOMY WITH RADIOACTIVE SEED LOCALIZATION;  Surgeon: [name], MD;  Location: [facility];  Service: General;  Laterality: Right;   CARDIAC CATHETERIZATION     CATARACT EXTRACTION  2014   COLONOSCOPY  2011   diverticulosis   COLPOSCOPY     FACIAL COSMETIC SURGERY     LYMPHADENECTOMY  1993   TONSILLECTOMY      No current facility-administered medications for this encounter.  Current Outpatient Medications  Medication Sig Dispense Refill Last Dose   acetaminophen (TYLENOL) 500 MG tablet Take 500 mg by mouth every 6 (six) hours as needed (pain.).      albuterol (VENTOLIN HFA) 108 (90 Base) MCG/ACT inhaler Inhale 2 puffs into the lungs every 6 (six) hours as needed. 18 g 2    Cholecalciferol (VITAMIN D) 2000 units CAPS Take 2,000 Units by mouth in the morning.      clopidogrel (PLAVIX) 75 MG tablet Take 75 mg by mouth in the morning.      dextromethorphan-guaiFENesin (MUCINEX DM) 30-600 MG 12hr tablet Take 1 tablet by mouth in the morning.       Fluticasone-Umeclidin-Vilant (TRELEGY ELLIPTA) 100-62.5-25 MCG/ACT AEPB INHALE ONE PUFF INTO THE LUNGS DAILY 60 each 11    ibandronate (BONIVA) 150 MG tablet Take 150 mg by mouth every 30 (thirty) days. Take in the morning with a full glass of water, on an empty stomach, and do not take anything else by mouth or lie down for the next 30 min.      mesalamine (LIALDA) 1.2 g EC tablet Take 1 tablet (1.2 g total) by mouth daily with breakfast. 30 tablet 0    metoprolol succinate (TOPROL-XL) 25 MG 24 hr tablet Take 12.5 mg by mouth in the morning.      omeprazole (PRILOSEC) 20 MG capsule Take 20 mg by mouth daily before breakfast.      rosuvastatin (CRESTOR) 5 MG tablet Take 5 mg by mouth in the morning.      Allergies  Allergen Reactions   Erythromycin Nausea And Vomiting   Macrobid [Nitrofurantoin] Hives and Rash    Social History   Tobacco Use   Smoking status: Former    Packs/day: 1.00    Years: 30.00    Additional pack years: 0.00    Total pack years: 30.00    Types: Cigarettes    Quit date: 02/20/2000    Years since quitting: 22.2   Smokeless tobacco: Never  Substance Use Topics   Alcohol use: Yes    Alcohol/week: 14.0 standard drinks of alcohol    Types: 14 Glasses of wine per week    Comment: 3 glsses wine daily    Family History  Problem Relation Age of Onset   Breast cancer Mother        Age 55   Hypertension Father      Review of Systems  Constitutional:  Negative for chills and fever.  Respiratory:  Negative for cough and shortness of breath.   Cardiovascular:  Negative for chest pain.  Gastrointestinal:  Negative for nausea and vomiting.  Musculoskeletal:  Positive for arthralgias.     Objective:  Physical Exam Well nourished and well developed. General: Alert and oriented x3, cooperative and pleasant, no acute distress. Head: normocephalic, atraumatic, neck supple. Eyes: EOMI.  Musculoskeletal: Right knee exam: No palpable effusion, warmth or  erythema Tenderness mainly lateral and anterior aspect of the joint Slight flexion contracture with flexion to 120 degrees No lower extremity edema or erythema  Calves soft and nontender. Motor function intact in LE. Strength 5/5 LE bilaterally. Neuro: Distal pulses 2+. Sensation to light touch intact in LE.  Vital signs in last 24 hours:    Labs:   Estimated body mass index is 19.22 kg/m as calculated from the following:   Height as of 05/09/22: 5\' 4"  (1.626 m).   Weight as of 05/09/22: 50.8 kg.   Imaging Review Plain radiographs demonstrate severe degenerative  joint disease of the right knee(s). The overall alignment isneutral. The bone quality appears to be adequate for age and reported activity level.      Assessment/Plan:  End stage arthritis, right knee   The patient history, physical examination, clinical judgment of the provider and imaging studies are consistent with end stage degenerative joint disease of the right knee(s) and total knee arthroplasty is deemed medically necessary. The treatment options including medical management, injection therapy arthroscopy and arthroplasty were discussed at length. The risks and benefits of total knee arthroplasty were presented and reviewed. The risks due to aseptic loosening, infection, stiffness, patella tracking problems, thromboembolic complications and other imponderables were discussed. The patient acknowledged the explanation, agreed to proceed with the plan and consent was signed. Patient is being admitted for inpatient treatment for surgery, pain control, PT, OT, prophylactic antibiotics, VTE prophylaxis, progressive ambulation and ADL's and discharge planning. The patient is planning to be discharged  home.  Therapy Plans: outpatient therapy at [facility] Disposition: Home with husband Planned DVT Prophylaxis: aspirin 81mg  BID DME needed: none PCP: Dr. Joylene Draft, clearance received TXA: IV Allergies: erythromycin - nausea, macrobid  - rash Anesthesia Concerns: none BMI: 19.6 Last HgbA1c: not diabetic   Other: - hx of frequent UTI - tramadol/oxycodone, robaxin, tylenol - Husband is retired ENT physician   Patient's anticipated LOS is less than 2 midnights, meeting these requirements: - Younger than 4 - Lives within 1 hour of care - Has a competent adult at home to recover with post-op recover - NO history of  - Chronic pain requiring opiods  - Diabetes  - Coronary Artery Disease  - Heart failure  - Heart attack  - Stroke  - DVT/VTE  - Cardiac arrhythmia  - Respiratory Failure/COPD  - Renal failure  - Anemia  - Advanced Liver disease  Costella Hatcher, PA-C Orthopedic Surgery [facility] [place] [phone]

## 2022-05-21 NOTE — Anesthesia Preprocedure Evaluation (Signed)
Anesthesia Evaluation  Patient identified by MRN, date of birth, ID band Patient awake    Reviewed: Allergy & Precautions, NPO status , Patient's Chart, lab work & pertinent test results, reviewed documented beta blocker date and time   History of Anesthesia Complications Negative for: history of anesthetic complications  Airway Mallampati: III  TM Distance: >3 FB Neck ROM: Full    Dental  (+) Dental Advisory Given   Pulmonary shortness of breath, asthma , neg sleep apnea, pneumonia (09/2021), COPD,  COPD inhaler, neg recent URI, former smoker   Pulmonary exam normal breath sounds clear to auscultation       Cardiovascular hypertension, (-) angina + DOE  (-) Past MI, (-) Cardiac Stents and (-) CABG + Valvular Problems/Murmurs (mild) AI and MR  Rhythm:Regular Rate:Normal  HLD  TTE 09/04/2017: Study Conclusions   - Left ventricle: The cavity size was normal. Wall thickness was    normal. Systolic function was normal. The estimated ejection    fraction was in the range of 55% to 60%. Wall motion was normal;    there were no regional wall motion abnormalities. Doppler    parameters are consistent with abnormal left ventricular    relaxation (grade 1 diastolic dysfunction). GLS -20.5%.  - Aortic valve: There was no stenosis. There was mild    regurgitation.  - Mitral valve: There was mild regurgitation.  - Right ventricle: The cavity size was normal. Systolic function    was normal.  - Atrial septum: No defect or patent foramen ovale was identified.    Echo contrast study showed no right-to-left atrial level shunt,    at baseline or with provocation.  - Tricuspid valve: Peak RV-RA gradient (S): 17 mm Hg.  - Pulmonary arteries: PA peak pressure: 20 mm Hg (S).  - Inferior vena cava: The vessel was normal in size. The    respirophasic diameter changes were in the normal range (>= 50%),    consistent with normal central venous  pressure.     Neuro/Psych  Headaches, neg Seizures TIA   GI/Hepatic Neg liver ROS,GERD  Medicated,,Diverticulosis, dysphagia   Endo/Other  negative endocrine ROS    Renal/GU negative Renal ROS   H/o frequent cystitis    Musculoskeletal  (+) Arthritis , Osteoarthritis,  osteoporosis   Abdominal   Peds  Hematology negative hematology ROS (+)   Anesthesia Other Findings H/o breast cancer  Last Plavix: 05/14/2022  Reproductive/Obstetrics                             Anesthesia Physical Anesthesia Plan  ASA: 3  Anesthesia Plan: MAC, Regional and Spinal   Post-op Pain Management: Regional block* and Tylenol PO (pre-op)*   Induction:   PONV Risk Score and Plan: 2 and Ondansetron, Dexamethasone, Propofol infusion and Treatment may vary due to age or medical condition  Airway Management Planned: Natural Airway and Simple Face Mask  Additional Equipment:   Intra-op Plan:   Post-operative Plan:   Informed Consent: I have reviewed the patients History and Physical, chart, labs and discussed the procedure including the risks, benefits and alternatives for the proposed anesthesia with the patient or authorized representative who has indicated his/her understanding and acceptance.     Dental advisory given  Plan Discussed with: CRNA and Anesthesiologist  Anesthesia Plan Comments: (Discussed potential risks of nerve blocks including, but not limited to, infection, bleeding, nerve damage, seizures, pneumothorax, respiratory depression, and potential failure of the  block. Alternatives to nerve blocks discussed. All questions answered.  I have discussed risks of neuraxial anesthesia including but not limited to infection, bleeding, nerve injury, back pain, headache, seizures, and failure of block. Patient denies bleeding disorders and is not currently anticoagulated. Labs have been reviewed. Risks and benefits discussed. All patient's questions  answered.   Discussed with patient risks of MAC including, but not limited to, minor pain or discomfort, hearing people in the room, and possible need for backup general anesthesia. Risks for general anesthesia also discussed including, but not limited to, sore throat, hoarse voice, chipped/damaged teeth, injury to vocal cords, nausea and vomiting, allergic reactions, lung infection, heart attack, stroke, and death. All questions answered. )       Anesthesia Quick Evaluation

## 2022-05-22 ENCOUNTER — Other Ambulatory Visit: Payer: Self-pay

## 2022-05-22 ENCOUNTER — Ambulatory Visit (HOSPITAL_COMMUNITY): Payer: PPO | Admitting: Physician Assistant

## 2022-05-22 ENCOUNTER — Encounter (HOSPITAL_COMMUNITY): Payer: Self-pay | Admitting: Orthopedic Surgery

## 2022-05-22 ENCOUNTER — Encounter (HOSPITAL_COMMUNITY)
Admission: RE | Disposition: A | Discharge status: Home / Self Care | Payer: Self-pay | Source: Ambulatory Visit | Attending: Orthopedic Surgery

## 2022-05-22 ENCOUNTER — Observation Stay (HOSPITAL_COMMUNITY)
Admission: RE | Admit: 2022-05-22 | Discharge: 2022-05-23 | Disposition: A | Discharge status: Home / Self Care | Payer: PPO | Source: Ambulatory Visit | Attending: Orthopedic Surgery | Admitting: Orthopedic Surgery

## 2022-05-22 ENCOUNTER — Ambulatory Visit (HOSPITAL_BASED_OUTPATIENT_CLINIC_OR_DEPARTMENT_OTHER): Payer: PPO | Admitting: Anesthesiology

## 2022-05-22 DIAGNOSIS — Z87891 Personal history of nicotine dependence: Secondary | ICD-10-CM

## 2022-05-22 DIAGNOSIS — I10 Essential (primary) hypertension: Secondary | ICD-10-CM | POA: Diagnosis not present

## 2022-05-22 DIAGNOSIS — J45909 Unspecified asthma, uncomplicated: Secondary | ICD-10-CM | POA: Insufficient documentation

## 2022-05-22 DIAGNOSIS — Z01818 Encounter for other preprocedural examination: Secondary | ICD-10-CM

## 2022-05-22 DIAGNOSIS — Z96651 Presence of right artificial knee joint: Secondary | ICD-10-CM

## 2022-05-22 DIAGNOSIS — Z8673 Personal history of transient ischemic attack (TIA), and cerebral infarction without residual deficits: Secondary | ICD-10-CM | POA: Diagnosis not present

## 2022-05-22 DIAGNOSIS — Z79899 Other long term (current) drug therapy: Secondary | ICD-10-CM | POA: Diagnosis not present

## 2022-05-22 DIAGNOSIS — Z7902 Long term (current) use of antithrombotics/antiplatelets: Secondary | ICD-10-CM | POA: Insufficient documentation

## 2022-05-22 DIAGNOSIS — G8918 Other acute postprocedural pain: Secondary | ICD-10-CM | POA: Diagnosis not present

## 2022-05-22 DIAGNOSIS — J449 Chronic obstructive pulmonary disease, unspecified: Secondary | ICD-10-CM | POA: Insufficient documentation

## 2022-05-22 DIAGNOSIS — Z853 Personal history of malignant neoplasm of breast: Secondary | ICD-10-CM | POA: Diagnosis not present

## 2022-05-22 DIAGNOSIS — M1711 Unilateral primary osteoarthritis, right knee: Principal | ICD-10-CM | POA: Insufficient documentation

## 2022-05-22 HISTORY — PX: TOTAL KNEE ARTHROPLASTY: SHX125

## 2022-05-22 SURGERY — ARTHROPLASTY, KNEE, TOTAL
Anesthesia: Monitor Anesthesia Care | Site: Knee | Laterality: Right

## 2022-05-22 MED ORDER — OXYCODONE HCL 5 MG/5ML PO SOLN: 5.0000 mg | Freq: Once | ORAL | Status: DC | PRN

## 2022-05-22 MED ORDER — MIDAZOLAM HCL 2 MG/2ML IJ SOLN
2.0000 mg | INTRAMUSCULAR | Status: DC
  Filled 2022-05-22: qty 2

## 2022-05-22 MED ORDER — ONDANSETRON HCL 4 MG/2ML IJ SOLN: 4.0000 mg | Freq: Four times a day (QID) | INTRAMUSCULAR | Status: DC | PRN

## 2022-05-22 MED ORDER — SODIUM CHLORIDE (PF) 0.9 % IJ SOLN
INTRAMUSCULAR | Status: DC | PRN
  Administered 2022-05-22: 30 mL

## 2022-05-22 MED ORDER — DOCUSATE SODIUM 100 MG PO CAPS
100.0000 mg | ORAL_CAPSULE | Freq: Two times a day (BID) | ORAL | Status: DC
  Administered 2022-05-22 – 2022-05-23 (×2): 100 mg via ORAL
  Filled 2022-05-22 (×2): qty 1

## 2022-05-22 MED ORDER — METOPROLOL SUCCINATE ER 25 MG PO TB24
12.5000 mg | ORAL_TABLET | Freq: Every day | ORAL | Status: DC
  Administered 2022-05-22: 12.5 mg via ORAL

## 2022-05-22 MED ORDER — CEFAZOLIN SODIUM-DEXTROSE 2-4 GM/100ML-% IV SOLN
2.0000 g | INTRAVENOUS | Status: AC
  Administered 2022-05-22: 2 g via INTRAVENOUS
  Filled 2022-05-22: qty 100

## 2022-05-22 MED ORDER — ROSUVASTATIN CALCIUM 5 MG PO TABS
5.0000 mg | ORAL_TABLET | Freq: Every day | ORAL | Status: DC
  Administered 2022-05-23: 5 mg via ORAL
  Filled 2022-05-22: qty 1

## 2022-05-22 MED ORDER — EPHEDRINE 5 MG/ML INJ
INTRAVENOUS | Status: AC
  Filled 2022-05-22: qty 5

## 2022-05-22 MED ORDER — AMISULPRIDE (ANTIEMETIC) 5 MG/2ML IV SOLN: 10.0000 mg | Freq: Once | INTRAVENOUS | Status: DC | PRN

## 2022-05-22 MED ORDER — METHOCARBAMOL 500 MG IVPB - SIMPLE MED: 500.0000 mg | Freq: Four times a day (QID) | INTRAVENOUS | Status: DC | PRN

## 2022-05-22 MED ORDER — CEFAZOLIN SODIUM-DEXTROSE 2-4 GM/100ML-% IV SOLN
2.0000 g | Freq: Four times a day (QID) | INTRAVENOUS | Status: AC
  Administered 2022-05-22 (×2): 2 g via INTRAVENOUS
  Filled 2022-05-22 (×2): qty 100

## 2022-05-22 MED ORDER — DIPHENHYDRAMINE HCL 12.5 MG/5ML PO ELIX: 12.5000 mg | ORAL_SOLUTION | ORAL | Status: DC | PRN

## 2022-05-22 MED ORDER — METOCLOPRAMIDE HCL 5 MG/ML IJ SOLN: 5.0000 mg | Freq: Three times a day (TID) | INTRAMUSCULAR | Status: DC | PRN

## 2022-05-22 MED ORDER — ASPIRIN 81 MG PO CHEW
81.0000 mg | CHEWABLE_TABLET | Freq: Two times a day (BID) | ORAL | Status: DC
  Administered 2022-05-22 – 2022-05-23 (×2): 81 mg via ORAL
  Filled 2022-05-22 (×2): qty 1

## 2022-05-22 MED ORDER — ROPIVACAINE HCL 5 MG/ML IJ SOLN
INTRAMUSCULAR | Status: DC | PRN
  Administered 2022-05-22: 20 mL via PERINEURAL

## 2022-05-22 MED ORDER — HYDROMORPHONE HCL 1 MG/ML IJ SOLN: 0.5000 mg | INTRAMUSCULAR | Status: DC | PRN

## 2022-05-22 MED ORDER — TRAMADOL HCL 50 MG PO TABS: 50.0000 mg | ORAL_TABLET | Freq: Four times a day (QID) | ORAL | Status: DC | PRN

## 2022-05-22 MED ORDER — OXYCODONE HCL 5 MG PO TABS: 5.0000 mg | ORAL_TABLET | ORAL | Status: DC | PRN

## 2022-05-22 MED ORDER — ACETAMINOPHEN 500 MG PO TABS
1000.0000 mg | ORAL_TABLET | Freq: Once | ORAL | Status: AC
  Administered 2022-05-22: 1000 mg via ORAL
  Filled 2022-05-22: qty 2

## 2022-05-22 MED ORDER — DEXAMETHASONE SODIUM PHOSPHATE 10 MG/ML IJ SOLN
8.0000 mg | Freq: Once | INTRAMUSCULAR | Status: AC
  Administered 2022-05-22: 8 mg via INTRAVENOUS

## 2022-05-22 MED ORDER — DEXAMETHASONE SODIUM PHOSPHATE 10 MG/ML IJ SOLN
INTRAMUSCULAR | Status: AC
  Filled 2022-05-22: qty 1

## 2022-05-22 MED ORDER — LACTATED RINGERS IV SOLN: INTRAVENOUS | Status: DC

## 2022-05-22 MED ORDER — CLOPIDOGREL BISULFATE 75 MG PO TABS
75.0000 mg | ORAL_TABLET | Freq: Every day | ORAL | Status: DC
  Administered 2022-05-23: 75 mg via ORAL
  Filled 2022-05-22: qty 1

## 2022-05-22 MED ORDER — PHENOL 1.4 % MT LIQD: 1.0000 | OROMUCOSAL | Status: DC | PRN

## 2022-05-22 MED ORDER — LIDOCAINE HCL (PF) 2 % IJ SOLN
INTRAMUSCULAR | Status: AC
  Filled 2022-05-22: qty 5

## 2022-05-22 MED ORDER — METHOCARBAMOL 500 MG PO TABS: 500.0000 mg | ORAL_TABLET | Freq: Four times a day (QID) | ORAL | Status: DC | PRN

## 2022-05-22 MED ORDER — POVIDONE-IODINE 10 % EX SWAB: 2.0000 | Freq: Once | CUTANEOUS | Status: DC

## 2022-05-22 MED ORDER — CHLORHEXIDINE GLUCONATE 0.12 % MT SOLN
15.0000 mL | Freq: Once | OROMUCOSAL | Status: AC
  Administered 2022-05-22: 15 mL via OROMUCOSAL

## 2022-05-22 MED ORDER — ONDANSETRON HCL 4 MG/2ML IJ SOLN
INTRAMUSCULAR | Status: DC | PRN
  Administered 2022-05-22: 4 mg via INTRAVENOUS

## 2022-05-22 MED ORDER — UMECLIDINIUM BROMIDE 62.5 MCG/ACT IN AEPB
1.0000 | INHALATION_SPRAY | Freq: Every day | RESPIRATORY_TRACT | Status: DC
  Administered 2022-05-23: 1 via RESPIRATORY_TRACT
  Filled 2022-05-22: qty 7

## 2022-05-22 MED ORDER — KETOROLAC TROMETHAMINE 30 MG/ML IJ SOLN
INTRAMUSCULAR | Status: DC | PRN
  Administered 2022-05-22: 30 mg

## 2022-05-22 MED ORDER — METOPROLOL SUCCINATE ER 25 MG PO TB24
ORAL_TABLET | ORAL | Status: AC
  Filled 2022-05-22: qty 1

## 2022-05-22 MED ORDER — KETOROLAC TROMETHAMINE 30 MG/ML IJ SOLN
INTRAMUSCULAR | Status: AC
  Filled 2022-05-22: qty 1

## 2022-05-22 MED ORDER — DEXAMETHASONE SODIUM PHOSPHATE 10 MG/ML IJ SOLN
10.0000 mg | Freq: Once | INTRAMUSCULAR | Status: AC
  Administered 2022-05-23: 10 mg via INTRAVENOUS
  Filled 2022-05-22: qty 1

## 2022-05-22 MED ORDER — LIDOCAINE 2% (20 MG/ML) 5 ML SYRINGE
INTRAMUSCULAR | Status: DC | PRN
  Administered 2022-05-22: 60 mg via INTRAVENOUS

## 2022-05-22 MED ORDER — EPHEDRINE SULFATE-NACL 50-0.9 MG/10ML-% IV SOSY
PREFILLED_SYRINGE | INTRAVENOUS | Status: DC | PRN
  Administered 2022-05-22 (×2): 5 mg via INTRAVENOUS

## 2022-05-22 MED ORDER — PANTOPRAZOLE SODIUM 40 MG PO TBEC
40.0000 mg | DELAYED_RELEASE_TABLET | Freq: Every day | ORAL | Status: DC
  Administered 2022-05-23: 40 mg via ORAL
  Filled 2022-05-22: qty 1

## 2022-05-22 MED ORDER — BUPIVACAINE IN DEXTROSE 0.75-8.25 % IT SOLN
INTRATHECAL | Status: DC | PRN
  Administered 2022-05-22: 1.6 mL via INTRATHECAL

## 2022-05-22 MED ORDER — TRANEXAMIC ACID-NACL 1000-0.7 MG/100ML-% IV SOLN
1000.0000 mg | Freq: Once | INTRAVENOUS | Status: AC
  Administered 2022-05-22: 1000 mg via INTRAVENOUS
  Filled 2022-05-22: qty 100

## 2022-05-22 MED ORDER — METOCLOPRAMIDE HCL 5 MG PO TABS: 5.0000 mg | ORAL_TABLET | Freq: Three times a day (TID) | ORAL | Status: DC | PRN

## 2022-05-22 MED ORDER — OXYCODONE HCL 5 MG PO TABS: 5.0000 mg | ORAL_TABLET | Freq: Once | ORAL | Status: DC | PRN

## 2022-05-22 MED ORDER — FENTANYL CITRATE PF 50 MCG/ML IJ SOSY
100.0000 ug | PREFILLED_SYRINGE | INTRAMUSCULAR | Status: DC
  Administered 2022-05-22: 50 ug via INTRAVENOUS
  Filled 2022-05-22: qty 2

## 2022-05-22 MED ORDER — METOPROLOL SUCCINATE ER 25 MG PO TB24
12.5000 mg | ORAL_TABLET | Freq: Every day | ORAL | Status: DC
  Administered 2022-05-23: 12.5 mg via ORAL
  Filled 2022-05-22: qty 1

## 2022-05-22 MED ORDER — MENTHOL 3 MG MT LOZG: 1.0000 | LOZENGE | OROMUCOSAL | Status: DC | PRN

## 2022-05-22 MED ORDER — PROPOFOL 500 MG/50ML IV EMUL
INTRAVENOUS | Status: DC | PRN
  Administered 2022-05-22: 100 ug/kg/min via INTRAVENOUS

## 2022-05-22 MED ORDER — ALBUTEROL SULFATE HFA 108 (90 BASE) MCG/ACT IN AERS: 2.0000 | INHALATION_SPRAY | Freq: Four times a day (QID) | RESPIRATORY_TRACT | Status: DC | PRN

## 2022-05-22 MED ORDER — MESALAMINE 1.2 G PO TBEC
1.2000 g | DELAYED_RELEASE_TABLET | Freq: Every day | ORAL | Status: DC
  Administered 2022-05-23: 1.2 g via ORAL
  Filled 2022-05-22: qty 1

## 2022-05-22 MED ORDER — POLYETHYLENE GLYCOL 3350 17 G PO PACK
17.0000 g | PACK | Freq: Two times a day (BID) | ORAL | Status: DC
  Administered 2022-05-22 – 2022-05-23 (×2): 17 g via ORAL
  Filled 2022-05-22 (×2): qty 1

## 2022-05-22 MED ORDER — ALBUTEROL SULFATE (2.5 MG/3ML) 0.083% IN NEBU: 2.5000 mg | INHALATION_SOLUTION | Freq: Four times a day (QID) | RESPIRATORY_TRACT | Status: DC | PRN

## 2022-05-22 MED ORDER — ONDANSETRON HCL 4 MG PO TABS: 4.0000 mg | ORAL_TABLET | Freq: Four times a day (QID) | ORAL | Status: DC | PRN

## 2022-05-22 MED ORDER — ORAL CARE MOUTH RINSE: 15.0000 mL | Freq: Once | OROMUCOSAL | Status: AC

## 2022-05-22 MED ORDER — SODIUM CHLORIDE 0.9 % IV SOLN: INTRAVENOUS | Status: DC

## 2022-05-22 MED ORDER — SODIUM CHLORIDE 0.9 % IR SOLN
Status: DC | PRN
  Administered 2022-05-22: 1000 mL

## 2022-05-22 MED ORDER — 0.9 % SODIUM CHLORIDE (POUR BTL) OPTIME
TOPICAL | Status: DC | PRN
  Administered 2022-05-22: 1000 mL

## 2022-05-22 MED ORDER — SODIUM CHLORIDE (PF) 0.9 % IJ SOLN
INTRAMUSCULAR | Status: AC
  Filled 2022-05-22: qty 30

## 2022-05-22 MED ORDER — PROPOFOL 1000 MG/100ML IV EMUL
INTRAVENOUS | Status: AC
  Filled 2022-05-22: qty 100

## 2022-05-22 MED ORDER — FLUTICASONE FUROATE-VILANTEROL 100-25 MCG/ACT IN AEPB
1.0000 | INHALATION_SPRAY | Freq: Every day | RESPIRATORY_TRACT | Status: DC
  Administered 2022-05-23: 1 via RESPIRATORY_TRACT
  Filled 2022-05-22: qty 28

## 2022-05-22 MED ORDER — EPINEPHRINE PF 1 MG/ML IJ SOLN
INTRAMUSCULAR | Status: AC
  Filled 2022-05-22: qty 1

## 2022-05-22 MED ORDER — BUPIVACAINE HCL (PF) 0.25 % IJ SOLN
INTRAMUSCULAR | Status: AC
  Filled 2022-05-22: qty 30

## 2022-05-22 MED ORDER — ONDANSETRON HCL 4 MG/2ML IJ SOLN
INTRAMUSCULAR | Status: AC
  Filled 2022-05-22: qty 2

## 2022-05-22 MED ORDER — BISACODYL 10 MG RE SUPP: 10.0000 mg | Freq: Every day | RECTAL | Status: DC | PRN

## 2022-05-22 MED ORDER — PROPOFOL 10 MG/ML IV BOLUS
INTRAVENOUS | Status: DC | PRN
  Administered 2022-05-22 (×2): 20 mg via INTRAVENOUS

## 2022-05-22 MED ORDER — BUPIVACAINE-EPINEPHRINE (PF) 0.25% -1:200000 IJ SOLN
INTRAMUSCULAR | Status: DC | PRN
  Administered 2022-05-22: 30 mL

## 2022-05-22 MED ORDER — ACETAMINOPHEN 325 MG PO TABS
325.0000 mg | ORAL_TABLET | Freq: Four times a day (QID) | ORAL | Status: DC | PRN
  Administered 2022-05-22: 650 mg via ORAL
  Filled 2022-05-22: qty 2

## 2022-05-22 MED ORDER — TRANEXAMIC ACID-NACL 1000-0.7 MG/100ML-% IV SOLN
1000.0000 mg | INTRAVENOUS | Status: DC
  Filled 2022-05-22: qty 100

## 2022-05-22 MED ORDER — FENTANYL CITRATE PF 50 MCG/ML IJ SOSY: 25.0000 ug | PREFILLED_SYRINGE | INTRAMUSCULAR | Status: DC | PRN

## 2022-05-22 SURGICAL SUPPLY — 59 items
ADH SKN CLS APL DERMABOND .7 (GAUZE/BANDAGES/DRESSINGS) ×1
ATTUNE MED ANAT PAT 35 KNEE (Knees) IMPLANT
BAG COUNTER SPONGE SURGICOUNT (BAG) IMPLANT
BAG SPEC THK2 15X12 ZIP CLS (MISCELLANEOUS) ×1
BAG SPNG CNTER NS LX DISP (BAG) ×1
BAG ZIPLOCK 12X15 (MISCELLANEOUS) IMPLANT
BASEPLATE TIB CMT FB PCKT SZ3 (Knees) IMPLANT
BLADE SAW SGTL 11.0X1.19X90.0M (BLADE) IMPLANT
BLADE SAW SGTL 13.0X1.19X90.0M (BLADE) ×1 IMPLANT
BNDG CMPR 5X62 HK CLSR LF (GAUZE/BANDAGES/DRESSINGS) ×1
BNDG ELASTIC 6INX 5YD STR LF (GAUZE/BANDAGES/DRESSINGS) ×1 IMPLANT
BOWL SMART MIX CTS (DISPOSABLE) ×1 IMPLANT
BSPLAT TIB 3 CMNT FXBRNG STRL (Knees) ×1 IMPLANT
CEMENT HV SMART SET (Cement) IMPLANT
COMP FEM CMT ATTUNE NRW 4 RT (Joint) ×1 IMPLANT
COMPONENT FEM CMT ATTN NRW 4RT (Joint) IMPLANT
COVER SURGICAL LIGHT HANDLE (MISCELLANEOUS) ×1 IMPLANT
CUFF TOURN SGL QUICK 34 (TOURNIQUET CUFF) ×1
CUFF TRNQT CYL 34X4.125X (TOURNIQUET CUFF) ×1 IMPLANT
DERMABOND ADVANCED .7 DNX12 (GAUZE/BANDAGES/DRESSINGS) ×1 IMPLANT
DRAPE U-SHAPE 47X51 STRL (DRAPES) ×1 IMPLANT
DRESSING AQUACEL AG SP 3.5X10 (GAUZE/BANDAGES/DRESSINGS) ×1 IMPLANT
DRSG AQUACEL AG ADV 3.5X10 (GAUZE/BANDAGES/DRESSINGS) IMPLANT
DRSG AQUACEL AG SP 3.5X10 (GAUZE/BANDAGES/DRESSINGS) ×1
DURAPREP 26ML APPLICATOR (WOUND CARE) ×2 IMPLANT
ELECT REM PT RETURN 15FT ADLT (MISCELLANEOUS) ×1 IMPLANT
GLOVE BIO SURGEON STRL SZ 6 (GLOVE) ×1 IMPLANT
GLOVE BIOGEL PI IND STRL 6.5 (GLOVE) ×1 IMPLANT
GLOVE BIOGEL PI IND STRL 7.5 (GLOVE) ×1 IMPLANT
GLOVE ORTHO TXT STRL SZ7.5 (GLOVE) ×2 IMPLANT
GOWN STRL REUS W/ TWL LRG LVL3 (GOWN DISPOSABLE) ×2 IMPLANT
GOWN STRL REUS W/TWL LRG LVL3 (GOWN DISPOSABLE) ×2
HANDPIECE INTERPULSE COAX TIP (DISPOSABLE) ×1
HOLDER FOLEY CATH W/STRAP (MISCELLANEOUS) IMPLANT
IMMOBILIZER KNEE 20 (SOFTGOODS) ×1
IMMOBILIZER KNEE 20 THIGH 36 (SOFTGOODS) IMPLANT
INSERT MED ATTUNE 4X6 RT (Insert) IMPLANT
KIT TURNOVER KIT A (KITS) IMPLANT
MANIFOLD NEPTUNE II (INSTRUMENTS) ×1 IMPLANT
NDL SAFETY ECLIP 18X1.5 (MISCELLANEOUS) IMPLANT
NS IRRIG 1000ML POUR BTL (IV SOLUTION) ×1 IMPLANT
PACK TOTAL KNEE CUSTOM (KITS) ×1 IMPLANT
PIN FIX SIGMA LCS THRD HI (PIN) IMPLANT
PROTECTOR NERVE ULNAR (MISCELLANEOUS) ×1 IMPLANT
SET HNDPC FAN SPRY TIP SCT (DISPOSABLE) ×1 IMPLANT
SET PAD KNEE POSITIONER (MISCELLANEOUS) ×1 IMPLANT
SPIKE FLUID TRANSFER (MISCELLANEOUS) ×2 IMPLANT
SUT MNCRL AB 4-0 PS2 18 (SUTURE) ×1 IMPLANT
SUT STRATAFIX PDS+ 0 24IN (SUTURE) ×1 IMPLANT
SUT VIC AB 1 CT1 36 (SUTURE) ×1 IMPLANT
SUT VIC AB 2-0 CT1 27 (SUTURE) ×2
SUT VIC AB 2-0 CT1 TAPERPNT 27 (SUTURE) ×2 IMPLANT
SYR 3ML LL SCALE MARK (SYRINGE) ×1 IMPLANT
TOWEL GREEN STERILE FF (TOWEL DISPOSABLE) ×1 IMPLANT
TRAY FOLEY MTR SLVR 14FR STAT (SET/KITS/TRAYS/PACK) IMPLANT
TRAY FOLEY MTR SLVR 16FR STAT (SET/KITS/TRAYS/PACK) ×1 IMPLANT
TUBE SUCTION HIGH CAP CLEAR NV (SUCTIONS) ×1 IMPLANT
WATER STERILE IRR 1000ML POUR (IV SOLUTION) ×2 IMPLANT
WRAP KNEE MAXI GEL POST OP (GAUZE/BANDAGES/DRESSINGS) ×1 IMPLANT

## 2022-05-22 NOTE — Transfer of Care (Signed)
Immediate Anesthesia Transfer of Care Note  Patient: Anna York  Procedure(s) Performed: TOTAL KNEE ARTHROPLASTY (Right: Knee)  Patient Location: PACU  Anesthesia Type:Regional and Spinal  Level of Consciousness: awake  Airway & Oxygen Therapy: Patient Spontanous Breathing and Patient connected to nasal cannula oxygen  Post-op Assessment: Report given to RN and Post -op Vital signs reviewed and stable  Post vital signs: Reviewed and stable  Last Vitals:  Vitals Value Taken Time  BP 111/68 05/22/22 1220  Temp    Pulse 66 05/22/22 1221  Resp 15 05/22/22 1221  SpO2 100 % 05/22/22 1221  Vitals shown include unvalidated device data.  Last Pain:  Vitals:   05/22/22 0815  TempSrc: Oral  PainSc: 0-No pain         Complications: No notable events documented.

## 2022-05-22 NOTE — Care Plan (Signed)
Ortho Bundle Case Management Note  Patient Details  Name: Anna York MRN: [id number] Date of Birth: 12-05-1940                  R TKA on 05/22/22.  DCP: Home with husband.  DME: No needs. Has RW.  PT: EO 4/5   DME Arranged:  N/A DME Agency:     HH Arranged:    HH Agency:     Additional Comments: Please contact me with any questions of if this plan should need to change.    Dario Ave, Case Manager  [facility]  [phone] 05/22/2022, 3:42 PM

## 2022-05-22 NOTE — Anesthesia Procedure Notes (Signed)
Anesthesia Regional Block: Adductor canal block   Pre-Anesthetic Checklist: , timeout performed,  Correct Patient, Correct Site, Correct Laterality,  Correct Procedure, Correct Position, site marked,  Risks and benefits discussed,  Surgical consent,  Pre-op evaluation,  At surgeon's request and post-op pain management  Laterality: Right  Prep: chloraprep       Needles:  Injection technique: Single-shot  Needle Type: Echogenic Stimulator Needle     Needle Length: 9cm  Needle Gauge: 21     Additional Needles:   Procedures:,,,, ultrasound used (permanent image in chart),,    Narrative:  Start time: 05/22/2022 8:20 AM End time: 05/22/2022 8:22 AM Injection made incrementally with aspirations every 5 mL.  Performed by: Personally  Anesthesiologist: Nilda Simmer, MD  Additional Notes: Discussed risks and benefits of nerve block including, but not limited to, prolonged and/or permanent nerve injury involving sensory and/or motor function. Monitors were applied and a time-out was performed. The nerve and associated structures were visualized under ultrasound guidance. After negative aspiration, local anesthetic was slowly injected around the nerve. There was no evidence of high pressure during the procedure. There were no paresthesias. VSS remained stable and the patient tolerated the procedure well.

## 2022-05-22 NOTE — Interval H&P Note (Signed)
History and Physical Interval Note:  05/22/2022 10:20 AM  Anna York  has presented today for surgery, with the diagnosis of Right knee osteoarthritis.  The various methods of treatment have been discussed with the patient and family. After consideration of risks, benefits and other options for treatment, the patient has consented to  Procedure(s): TOTAL KNEE ARTHROPLASTY (Right) as a surgical intervention.  The patient's history has been reviewed, patient examined, no change in status, stable for surgery.  I have reviewed the patient's chart and labs.  Questions were answered to the patient's satisfaction.     Mauri Pole

## 2022-05-22 NOTE — Evaluation (Signed)
Physical Therapy Evaluation Patient Details Name: Anna York MRN: [id number] DOB: [birth year] Today's Date: 05/22/2022  History of Present Illness  82 yo female presents to therapy s/p R TKA on 05/22/2022 due to failure of conservative measures. Pt has PMH including but not limited to: R BaCa s/p chemo and readiation, CPOD. TIA, LBP and dysphagia.  Clinical Impression    Anna York is a 82 y.o. female POD 0 s/p R TKA. Patient reports IND with mobility at baseline. Patient is now limited by functional impairments (see PT problem list below) and requires min guard for bed mobility and min A for transfers. Patient was able to side step to L  2 feet with RW and min A level of assist. Patient instructed in exercise to facilitate ROM and circulation to manage edema. Patient will benefit from continued skilled PT interventions to address impairments and progress towards PLOF. Acute PT will follow to progress mobility and stair training in preparation for safe discharge home with family support and report OPPT starting 4/5.      Recommendations for follow up therapy are one component of a multi-disciplinary discharge planning process, led by the attending physician.  Recommendations may be updated based on patient status, additional functional criteria and insurance authorization.  Follow Up Recommendations       Assistance Recommended at Discharge Frequent or constant Supervision/Assistance  Patient can return home with the following  A lot of help with walking and/or transfers;A lot of help with bathing/dressing/bathroom;Assistance with cooking/housework;Assist for transportation;Help with stairs or ramp for entrance    Equipment Recommendations None recommended by PT (pt reports having DME in home setting)  Recommendations for Other Services       Functional Status Assessment Patient has had a recent decline in their functional status and demonstrates the ability to make significant  improvements in function in a reasonable and predictable amount of time.     Precautions / Restrictions Precautions Precautions: Knee;Fall Restrictions Weight Bearing Restrictions: No      Mobility  Bed Mobility Overal bed mobility: Needs Assistance Bed Mobility: Supine to Sit, Sit to Supine     Supine to sit: HOB elevated, Min guard Sit to supine: Min guard   General bed mobility comments: increased time    Transfers Overall transfer level: Needs assistance Equipment used: Rolling walker (2 wheels) Transfers: Sit to/from Stand Sit to Stand: Min assist           General transfer comment: cues for proper B UE placement and noted LE instabiltiy in standing    Ambulation/Gait               General Gait Details: NT due to slow regression of spinal with B LE abn sensation and noted instability with WB, pt able to side step to [facility] with RW and min A  Stairs            Wheelchair Mobility    Modified Rankin (Stroke Patients Only)       Balance Overall balance assessment: Needs assistance Sitting-balance support: Feet supported Sitting balance-Leahy Scale: Fair     Standing balance support: Bilateral upper extremity supported, During functional activity, Reliant on assistive device for balance Standing balance-Leahy Scale: Poor                               Pertinent Vitals/Pain Pain Assessment Pain Assessment: 0-10 Pain Score: 5  Pain Location: R Knee Pain Descriptors /  Indicators: Aching, Constant, Discomfort, Operative site guarding Pain Intervention(s): Limited activity within patient's tolerance, Monitored during session, Premedicated before session, Repositioned, Ice applied    Home Living Family/patient expects to be discharged to:: Private residence Living Arrangements: Spouse/significant other Available Help at Discharge: Family Type of Home: House Home Access: Stairs to enter Entrance Stairs-Rails: Right Entrance  Stairs-Number of Steps: 5   Home Layout: Two level;Able to live on main level with bedroom/bathroom Home Equipment: Shower Land (2 wheels);BSC/3in1;Cane - single point      Prior Function Prior Level of Function : Independent/Modified Independent             Mobility Comments: IND with all ADLs, self care tasks, IADLs and driving       Hand Dominance        Extremity/Trunk Assessment        Lower Extremity Assessment Lower Extremity Assessment: RLE deficits/detail RLE Deficits / Details: ankle DF 4/5 and PF 5/5, SLR < 10 degree lag RLE Sensation: decreased light touch (pt reported once EOB that B LE deminished sensation)    Cervical / Trunk Assessment Cervical / Trunk Assessment: Normal  Communication   Communication: No difficulties  Cognition Arousal/Alertness: Awake/alert Behavior During Therapy: WFL for tasks assessed/performed Overall Cognitive Status: Within Functional Limits for tasks assessed                                          General Comments      Exercises Total Joint Exercises Ankle Circles/Pumps: AROM, Both, 20 reps   Assessment/Plan    PT Assessment Patient needs continued PT services  PT Problem List Decreased strength;Decreased range of motion;Decreased activity tolerance;Decreased balance;Decreased mobility;Decreased coordination;Pain       PT Treatment Interventions DME instruction;Gait training;Stair training;Functional mobility training;Therapeutic activities;Therapeutic exercise;Balance training;Neuromuscular re-education;Patient/family education;Modalities    PT Goals (Current goals can be found in the Care Plan section)  Acute Rehab PT Goals Patient Stated Goal: be able to walk normally and go to the beach in June PT Goal Formulation: With patient Time For Goal Achievement: 06/05/22 Potential to Achieve Goals: Good    Frequency 7X/week     Co-evaluation               AM-PAC PT "6  Clicks" Mobility  Outcome Measure Help needed turning from your back to your side while in a flat bed without using bedrails?: A Little Help needed moving from lying on your back to sitting on the side of a flat bed without using bedrails?: A Little Help needed moving to and from a bed to a chair (including a wheelchair)?: A Little Help needed standing up from a chair using your arms (e.g., wheelchair or bedside chair)?: A Little Help needed to walk in hospital room?: Total Help needed climbing 3-5 steps with a railing? : Total 6 Click Score: 14    End of Session Equipment Utilized During Treatment: Gait belt Activity Tolerance: No increased pain;Treatment limited secondary to medical complications (Comment) (slow regression of spinal) Patient left: in bed;with call bell/phone within reach;with bed alarm set Nurse Communication: Mobility status (ongoing B LE instabiltiy and abn sensation) PT Visit Diagnosis: Unsteadiness on feet (R26.81);Other abnormalities of gait and mobility (R26.89);Muscle weakness (generalized) (M62.81);Difficulty in walking, not elsewhere classified (R26.2);Pain Pain - Right/Left: Right Pain - part of body: Knee    Time: 1721-1750 PT Time Calculation (min) (ACUTE ONLY):  29 min   Charges:   PT Evaluation $PT Eval Low Complexity: 1 Low PT Treatments $Therapeutic Activity: 8-22 mins        Baird Lyons, PT   Adair Patter 05/22/2022, 7:18 PM

## 2022-05-22 NOTE — Anesthesia Procedure Notes (Signed)
Spinal  Patient location during procedure: OR End time: 05/22/2022 10:33 AM Reason for block: surgical anesthesia Staffing Performed: resident/CRNA  Resident/CRNA: Noralyn Pick D, CRNA Performed by: Kehinde Bowdish, Clinical cytogeneticist D, CRNA Authorized by: Nilda Simmer, MD   Preanesthetic Checklist Completed: patient identified, IV checked, site marked, risks and benefits discussed, surgical consent, monitors and equipment checked, pre-op evaluation and timeout performed Spinal Block Patient position: sitting Prep: DuraPrep Patient monitoring: heart rate, continuous pulse ox and blood pressure Approach: midline Location: L3-4 Injection technique: single-shot Needle Needle type: Pencan  Needle gauge: 24 G Needle length: 9 cm Assessment Sensory level: T6 Events: CSF return

## 2022-05-22 NOTE — Anesthesia Procedure Notes (Signed)
Spinal  Staffing Performed by: Yulanda Diggs D, CRNA Authorized by: Nilda Simmer, MD

## 2022-05-22 NOTE — Op Note (Signed)
NAME:  [place] RECORD NO.:  [id number]                             FACILITY:  [facility]      PHYSICIAN:  Pietro Cassis. Alvan Dame, M.D.  DATE OF BIRTH:  1940-11-14      DATE OF PROCEDURE:  05/22/2022                                     OPERATIVE REPORT         PREOPERATIVE DIAGNOSIS:  Right knee osteoarthritis.      POSTOPERATIVE DIAGNOSIS:  Right knee osteoarthritis.      FINDINGS:  The patient was noted to have complete loss of cartilage and   bone-on-bone arthritis with associated osteophytes in the patellofemoral and lateral compartments of   the knee with a significant synovitis and associated effusion.  The patient had failed months of conservative treatment including medications, injection therapy, activity modification.     PROCEDURE:  Right total knee replacement.      COMPONENTS USED:  DePuy Attune FB CR MS knee   system, a size 4N femur, 3 tibia, size 6 mm CR MS AOX insert, and 35 anatomic patellar   button.      SURGEON:  Pietro Cassis. Alvan Dame, M.D.      ASSISTANT:  Costella Hatcher, PA-C.      ANESTHESIA:  Regional and Spinal.      SPECIMENS:  None.      COMPLICATION:  None.      DRAINS:  None.  EBL: <100 cc      TOURNIQUET TIME:  33 min at 225 mmHg     The patient was stable to the recovery room.      INDICATION FOR PROCEDURE:  Anna York is a 82 y.o. female patient of   mine.  The patient had been seen, evaluated, and treated for months conservatively in the   office with medication, activity modification, and injections.  The patient had   radiographic changes of bone-on-bone arthritis with endplate sclerosis and osteophytes noted.  Based on the radiographic changes and failed conservative measures, the patient   decided to proceed with definitive treatment, total knee replacement.  Risks of infection, DVT, component failure, need for revision surgery, neurovascular injury were reviewed in the office setting.  The postop course  was reviewed stressing the efforts to maximize post-operative satisfaction and function.  Consent was obtained for benefit of pain   relief.      PROCEDURE IN DETAIL:  The patient was brought to the operative theater.   Once adequate anesthesia, preoperative antibiotics, 2 gm of Ancef,1 gm of Tranexamic Acid, and 10 mg of Decadron administered, the patient was positioned supine with a right thigh tourniquet placed.  The  right lower extremity was prepped and draped in sterile fashion.  A time-   out was performed identifying the patient, planned procedure, and the appropriate extremity.      The right lower extremity was placed in the [facility] leg holder.  The leg was   exsanguinated, tourniquet elevated to 225 mmHg.  A midline incision was   made followed by median parapatellar arthrotomy.  Following initial   exposure, attention was  first directed to the patella.  Precut   measurement was noted to be 19 mm.  I resected down to 13 mm and used a   35 anatomic patellar button to restore patellar height as well as cover the cut surface.      The lug holes were drilled and a metal shim was placed to protect the   patella from retractors and saw blade during the procedure.      At this point, attention was now directed to the femur.  The femoral   canal was opened with a drill, irrigated to try to prevent fat emboli.  An   intramedullary rod was passed at 3 degrees valgus, 9 mm of bone was   resected off the distal femur.  Following this resection, the tibia was   subluxated anteriorly.  Using the extramedullary guide, 2 mm of bone was resected off   the proximal lateral tibia.  We confirmed the gap would be   stable medially and laterally with a size 5 spacer block as well as confirmed that the tibial cut was perpendicular in the coronal plane, checking with an alignment rod.      Once this was done, I sized the femur to be a size 4 in the anterior-   posterior dimension, chose a narrow  component based on medial and   lateral dimension.  The size 4 rotation block was then pinned in   position anterior referenced using the C-clamp to set rotation.  The   anterior, posterior, and  chamfer cuts were made without difficulty nor   notching making certain that I was along the anterior cortex to help   with flexion gap stability.      The final shim cut was made off the lateral aspect of distal femur.      At this point, the tibia was sized to be a size 3.  The size 3 tray was   then pinned in position through the medial third of the tubercle,   drilled, and keel punched.  Trial reduction was now carried with a 4 femur,  3 tibia, a size 6 mm CR MS insert, and the 35 anatomic patella botton.  The knee was brought to full extension with good flexion stability with the patella   tracking through the trochlea without application of pressure.  Given   all these findings the trial components removed.  Final components were   opened and cement was mixed.  The knee was irrigated with normal saline solution and pulse lavage.  The synovial lining was   then injected with 30 cc of 0.25% Marcaine with epinephrine, 1 cc of Toradol and 30 cc of NS for a total of 61 cc.     Final implants were then cemented onto cleaned and dried cut surfaces of bone with the knee brought to extension with a size 6 mm CR MS trial insert.      Once the cement had fully cured, excess cement was removed   throughout the knee.  I confirmed that I was satisfied with the range of   motion and stability, and the final size 6 mm CR MS AOX insert was chosen.  It was   placed into the knee.      The tourniquet had been let down at 33 minutes.  No significant   hemostasis was required.  The extensor mechanism was then reapproximated using #1 Vicryl and #1 Stratafix sutures with the knee   in flexion.  The   remaining wound was closed with 2-0 Vicryl and running 4-0 Monocryl.   The knee was cleaned, dried, dressed  sterilely using Dermabond and   Aquacel dressing.  The patient was then   brought to recovery room in stable condition, tolerating the procedure   well.   Please note that Physician Assistant, Costella Hatcher, PA-C was present for the entirety of the case, and was utilized for pre-operative positioning, peri-operative retractor management, general facilitation of the procedure and for primary wound closure at the end of the case.              Pietro Cassis Alvan Dame, M.D.    05/22/2022 8:54 AM

## 2022-05-22 NOTE — Discharge Instructions (Signed)

## 2022-05-22 NOTE — Anesthesia Postprocedure Evaluation (Signed)
Anesthesia Post Note  Patient: LATOSIA GENNARELLI  Procedure(s) Performed: TOTAL KNEE ARTHROPLASTY (Right: Knee)     Patient location during evaluation: PACU Anesthesia Type: Regional, Spinal and MAC Level of consciousness: awake Pain management: pain level controlled Vital Signs Assessment: post-procedure vital signs reviewed and stable Respiratory status: spontaneous breathing, respiratory function stable and nonlabored ventilation Cardiovascular status: blood pressure returned to baseline and stable Postop Assessment: no headache, no backache and no apparent nausea or vomiting Anesthetic complications: no   No notable events documented.  Last Vitals:  Vitals:   05/22/22 1330 05/22/22 1345  BP: 134/73 138/68  Pulse: 60 (!) 59  Resp: 17 (!) 24  Temp:  (!) 36.4 C  SpO2: 100% 100%    Last Pain:  Vitals:   05/22/22 1345  TempSrc:   PainSc: 0-No pain                 Nilda Simmer

## 2022-05-23 DIAGNOSIS — M1711 Unilateral primary osteoarthritis, right knee: Secondary | ICD-10-CM | POA: Diagnosis not present

## 2022-05-23 LAB — CBC
HCT: 32.6 % — ABNORMAL LOW (ref 36.0–46.0)
Hemoglobin: 10.8 g/dL — ABNORMAL LOW (ref 12.0–15.0)
MCH: 32.9 pg (ref 26.0–34.0)
MCHC: 33.1 g/dL (ref 30.0–36.0)
MCV: 99.4 fL (ref 80.0–100.0)
Platelets: 185 10*3/uL (ref 150–400)
RBC: 3.28 MIL/uL — ABNORMAL LOW (ref 3.87–5.11)
RDW: 13.3 % (ref 11.5–15.5)
WBC: 10 10*3/uL (ref 4.0–10.5)
nRBC: 0 % (ref 0.0–0.2)

## 2022-05-23 LAB — BASIC METABOLIC PANEL
Anion gap: 8 (ref 5–15)
BUN: 12 mg/dL (ref 8–23)
CO2: 21 mmol/L — ABNORMAL LOW (ref 22–32)
Calcium: 8.4 mg/dL — ABNORMAL LOW (ref 8.9–10.3)
Chloride: 107 mmol/L (ref 98–111)
Creatinine, Ser: 0.6 mg/dL (ref 0.44–1.00)
GFR, Estimated: >60 mL/min (ref >60)
Glucose, Bld: 129 mg/dL — ABNORMAL HIGH (ref 70–99)
Potassium: 4 mmol/L (ref 3.5–5.1)
Sodium: 136 mmol/L (ref 135–145)

## 2022-05-23 MED ORDER — METHOCARBAMOL 500 MG PO TABS: 500.0000 mg | ORAL_TABLET | Freq: Four times a day (QID) | ORAL | 2 refills | Status: DC | PRN

## 2022-05-23 MED ORDER — ACETAMINOPHEN 500 MG PO TABS
1000.0000 mg | ORAL_TABLET | Freq: Four times a day (QID) | ORAL | Status: DC
  Administered 2022-05-23: 1000 mg via ORAL
  Filled 2022-05-23: qty 2

## 2022-05-23 MED ORDER — OXYCODONE HCL 5 MG PO TABS: 5.0000 mg | ORAL_TABLET | ORAL | 0 refills | Status: DC | PRN

## 2022-05-23 MED ORDER — POLYETHYLENE GLYCOL 3350 17 G PO PACK: 17.0000 g | PACK | Freq: Two times a day (BID) | ORAL | 0 refills | Status: DC

## 2022-05-23 NOTE — Progress Notes (Addendum)
Physical Therapy Treatment Patient Details Name: Anna York MRN: [id number] DOB: 1940-05-20 Today's Date: 05/23/2022   History of Present Illness 82 yo female presents to therapy s/p R TKA on 05/22/2022 due to failure of conservative measures. Pt has PMH including but not limited to: R BaCa s/p chemo and readiation, CPOD. TIA, LBP and dysphagia.    PT Comments    Pt up in recliner, motivated to return home today. Pt amb 40 ft with RW, slow step to gait pattern, appears nervous, cues to avoid holding breath and for relaxation. Returned to room for seated rest break then pt amb 100 ft with RW, more step through gait pattern with increased RLE stance and weight-bearing in stance phase. Pt cued on ankle pumps, LAQ and heel slides in sitting, no increased pain, good muscle activation noted. Plan for stair training with spouse next session with hopes to d/c home with family support.   Recommendations for follow up therapy are one component of a multi-disciplinary discharge planning process, led by the attending physician.  Recommendations may be updated based on patient status, additional functional criteria and insurance authorization.  Follow Up Recommendations       Assistance Recommended at Discharge Frequent or constant Supervision/Assistance  Patient can return home with the following A lot of help with walking and/or transfers;A lot of help with bathing/dressing/bathroom;Assistance with cooking/housework;Assist for transportation;Help with stairs or ramp for entrance   Equipment Recommendations  None recommended by PT (pt reports having DME in home setting)    Recommendations for Other Services       Precautions / Restrictions Precautions Precautions: Knee;Fall Restrictions Weight Bearing Restrictions: No     Mobility  Bed Mobility  General bed mobility comments: in recliner    Transfers Overall transfer level: Needs assistance Equipment used: Rolling walker (2  wheels) Transfers: Sit to/from Stand Sit to Stand: Min guard  General transfer comment: cues for hand placement, RLE slightly extended for comfort    Ambulation/Gait Ambulation/Gait assistance: Min guard Gait Distance (Feet): 100 Feet (+additional 40) Assistive device: Rolling walker (2 wheels) Gait Pattern/deviations: Step-to pattern, Decreased stride length, Decreased weight shift to right, Decreased stance time - right Gait velocity: decreased  General Gait Details: pt amb 40 ft with RW demo slow, step to gait pattern, decreased RLE stance time and weightbearing, appears very nervous, relaxation cues utilized; after seated rest break, pt ambulates 100 ft with RW, step-to gait pattern with slight step-through, good steadiness, increased time with turns and around obstacles   Stairs             Wheelchair Mobility    Modified Rankin (Stroke Patients Only)       Balance Overall balance assessment: Needs assistance  Standing balance support: Bilateral upper extremity supported, During functional activity, Reliant on assistive device for balance Standing balance-Leahy Scale: Poor     Cognition Arousal/Alertness: Awake/alert Behavior During Therapy: WFL for tasks assessed/performed Overall Cognitive Status: Within Functional Limits for tasks assessed       Exercises Total Joint Exercises Ankle Circles/Pumps: AROM, Both, 20 reps Heel Slides: Seated, AROM, Right, 10 reps Long Arc Quad: Seated, AROM, Strengthening, Right, 10 reps    General Comments        Pertinent Vitals/Pain Pain Assessment Pain Assessment: 0-10 Pain Score: 3  Pain Location: R Knee Pain Descriptors / Indicators: Aching, Sore Pain Intervention(s): Limited activity within patient's tolerance, Monitored during session, Repositioned, Ice applied    Home Living  Prior Function            PT Goals (current goals can now be found in the care plan section)  Acute Rehab PT Goals Patient Stated Goal: be able to walk normally and go to the beach in June PT Goal Formulation: With patient Time For Goal Achievement: 06/05/22 Potential to Achieve Goals: Good Progress towards PT goals: Progressing toward goals    Frequency    7X/week      PT Plan      Co-evaluation              AM-PAC PT "6 Clicks" Mobility   Outcome Measure  Help needed turning from your back to your side while in a flat bed without using bedrails?: A Little Help needed moving from lying on your back to sitting on the side of a flat bed without using bedrails?: A Little Help needed moving to and from a bed to a chair (including a wheelchair)?: A Little Help needed standing up from a chair using your arms (e.g., wheelchair or bedside chair)?: A Little Help needed to walk in hospital room?: A Little Help needed climbing 3-5 steps with a railing? : A Lot 6 Click Score: 17    End of Session Equipment Utilized During Treatment: Gait belt Activity Tolerance: No increased pain Patient left: in chair;with call bell/phone within reach Nurse Communication: Mobility status  PT Visit Diagnosis: Unsteadiness on feet (R26.81);Other abnormalities of gait and mobility (R26.89);Muscle weakness (generalized) (M62.81);Difficulty in walking, not elsewhere classified (R26.2);Pain Pain - Right/Left: Right Pain - part of body: Knee     Time: [id number] PT Time Calculation (min) (ACUTE ONLY): 31 min  Charges:  $Gait Training: 8-22 mins $Therapeutic Exercise: 8-22 mins                      Tori Ahmed Inniss PT, DPT 05/23/22, 1:15 PM

## 2022-05-23 NOTE — Progress Notes (Addendum)
Physical Therapy Treatment Patient Details Name: Anna York MRN: [id number] DOB: [birth year] Today's Date: 05/23/2022   History of Present Illness 82 yo female presents to therapy s/p R TKA on 05/22/2022 due to failure of conservative measures. Pt has PMH including but not limited to: R BaCa s/p chemo and readiation, CPOD. TIA, LBP and dysphagia.    PT Comments    Return for stair training with pt's spouse present. Reviewed sequencing with stairs; pt demos good step-to gait pattern, maintains sequencing, good steadiness, supv for safety. Pt amb 150 ft with RW with step to progressing to step through gait pattern. Pt able to navigate around obstacles in hallway and room, good steadiness, no knee buckling noted. All questions answered, pt planning to d/c home with spouse assisting as needed, and reports OPPT starts 05/25/22.   Recommendations for follow up therapy are one component of a multi-disciplinary discharge planning process, led by the attending physician.  Recommendations may be updated based on patient status, additional functional criteria and insurance authorization.  Follow Up Recommendations       Assistance Recommended at Discharge Frequent or constant Supervision/Assistance  Patient can return home with the following Assistance with cooking/housework;Assist for transportation;Help with stairs or ramp for entrance;A little help with walking and/or transfers;A little help with bathing/dressing/bathroom   Equipment Recommendations  None recommended by PT (pt reports having DME in home setting)    Recommendations for Other Services       Precautions / Restrictions Precautions Precautions: Knee;Fall Restrictions Weight Bearing Restrictions: No     Mobility  Bed Mobility  General bed mobility comments: in recliner    Transfers Overall transfer level: Needs assistance Equipment used: Rolling walker (2 wheels) Transfers: Sit to/from Stand Sit to Stand: Supervision   General transfer comment: cues for hand placement, educated pt on WBAT but pt prefers RLE extended for comfort    Ambulation/Gait Ambulation/Gait assistance: Supervision Gait Distance (Feet): 150 Feet Assistive device: Rolling walker (2 wheels) Gait Pattern/deviations: Step-to pattern, Step-through pattern, Decreased stride length Gait velocity: decreased  General Gait Details: step to progressing to more step-through, increased time with turns and around obstacles, good steadiness and no knee buckling noted   Stairs Stairs: Yes Stairs assistance: Supervision Stair Management: One rail Right, Step to pattern, Forwards Number of Stairs: 5 General stair comments: step to gait pattern with R handrail only, BUE holding to handrail to ascend and descend, pt following sequencing appropriately, supv for safety, spouse present   Wheelchair Mobility    Modified Rankin (Stroke Patients Only)       Balance Overall balance assessment: Needs assistance  Standing balance support: Bilateral upper extremity supported, During functional activity, Reliant on assistive device for balance Standing balance-Leahy Scale: Poor     Cognition Arousal/Alertness: Awake/alert Behavior During Therapy: WFL for tasks assessed/performed Overall Cognitive Status: Within Functional Limits for tasks assessed     Exercises Total Joint Exercises Ankle Circles/Pumps: AROM, Both, 20 reps Heel Slides: Seated, AROM, Right, 10 reps Long Arc Quad: Seated, AROM, Strengthening, Right, 10 reps    General Comments        Pertinent Vitals/Pain Pain Assessment Pain Assessment: 0-10 Pain Score: 4  Pain Location: R Knee Pain Descriptors / Indicators: Aching, Sore Pain Intervention(s): Limited activity within patient's tolerance, Monitored during session, Premedicated before session, Repositioned    Home Living                          Prior  Function            PT Goals (current goals can now be  found in the care plan section) Acute Rehab PT Goals Patient Stated Goal: be able to walk normally and go to the beach in June PT Goal Formulation: With patient Time For Goal Achievement: 06/05/22 Potential to Achieve Goals: Good Progress towards PT goals: Progressing toward goals    Frequency    7X/week      PT Plan Current plan remains appropriate    Co-evaluation              AM-PAC PT "6 Clicks" Mobility   Outcome Measure  Help needed turning from your back to your side while in a flat bed without using bedrails?: A Little Help needed moving from lying on your back to sitting on the side of a flat bed without using bedrails?: A Little Help needed moving to and from a bed to a chair (including a wheelchair)?: A Little Help needed standing up from a chair using your arms (e.g., wheelchair or bedside chair)?: A Little Help needed to walk in hospital room?: A Little Help needed climbing 3-5 steps with a railing? : A Little 6 Click Score: 18    End of Session Equipment Utilized During Treatment: Gait belt Activity Tolerance: Patient tolerated treatment well Patient left: with family/visitor present;in chair;with call bell/phone within reach Nurse Communication: Mobility status  PT Visit Diagnosis: Unsteadiness on feet (R26.81);Other abnormalities of gait and mobility (R26.89);Muscle weakness (generalized) (M62.81);Difficulty in walking, not elsewhere classified (R26.2);Pain Pain - Right/Left: Right Pain - part of body: Knee     Time: [id number] PT Time Calculation (min) (ACUTE ONLY): 29 min  Charges:  $Gait Training: 23-37 mins                     Tori Zandyr Barnhill PT, DPT 05/23/22, 1:24 PM

## 2022-05-23 NOTE — TOC Transition Note (Signed)
Transition of Care [facility]) - CM/SW Discharge Note  Patient Details  Name: Anna York MRN: [id number] Date of Birth: Feb 16, 1941  [facility] CM/SW Contact:  Sherie Don, LCSW Phone Number: 05/23/2022, 11:05 AM  Clinical Narrative: Patient is expected to discharge home after working with PT. [name] met with patient to confirm discharge plan. Patient will go home with OPPT at [facility]. Patient has a rolling walker, shower chair, and 3N1 at home so there are no DME needs at this time. TOC signing off.  Final next level of care: OP Rehab Barriers to Discharge: No Barriers Identified  Patient Goals and CMS Choice Choice offered to / list presented to : NA  Discharge Plan and Services Additional resources added to the After Visit Summary for          DME Arranged: N/A DME Agency: NA  Social Determinants of Health (SDOH) Interventions SDOH Screenings   Food Insecurity: No Food Insecurity (05/22/2022)  Housing: Low Risk  (05/22/2022)  Transportation Needs: No Transportation Needs (05/22/2022)  Utilities: Not At Risk (05/22/2022)  Tobacco Use: Medium Risk (05/22/2022)   Readmission Risk Interventions     No data to display

## 2022-05-23 NOTE — Plan of Care (Signed)
  Problem: Education: Goal: Knowledge of the prescribed therapeutic regimen will improve Outcome: Completed/Met Goal: Individualized Educational Video(s) Outcome: Completed/Met   Problem: Activity: Goal: Ability to avoid complications of mobility impairment will improve Outcome: Completed/Met Goal: Range of joint motion will improve Outcome: Completed/Met   Problem: Clinical Measurements: Goal: Postoperative complications will be avoided or minimized Outcome: Completed/Met   Problem: Pain Management: Goal: Pain level will decrease with appropriate interventions Outcome: Completed/Met   Problem: Skin Integrity: Goal: Will show signs of wound healing Outcome: Completed/Met   Problem: Education: Goal: Knowledge of General Education information will improve Description: Including pain rating scale, medication(s)/side effects and non-pharmacologic comfort measures Outcome: Completed/Met   Problem: Health Behavior/Discharge Planning: Goal: Ability to manage health-related needs will improve Outcome: Completed/Met   Problem: Clinical Measurements: Goal: Ability to maintain clinical measurements within normal limits will improve Outcome: Completed/Met Goal: Will remain free from infection Outcome: Completed/Met Goal: Diagnostic test results will improve Outcome: Completed/Met Goal: Respiratory complications will improve Outcome: Completed/Met Goal: Cardiovascular complication will be avoided Outcome: Completed/Met   Problem: Activity: Goal: Risk for activity intolerance will decrease Outcome: Completed/Met   Problem: Nutrition: Goal: Adequate nutrition will be maintained Outcome: Completed/Met   Problem: Coping: Goal: Level of anxiety will decrease Outcome: Completed/Met   Problem: Elimination: Goal: Will not experience complications related to bowel motility Outcome: Completed/Met Goal: Will not experience complications related to urinary retention Outcome:  Completed/Met   Problem: Pain Managment: Goal: General experience of comfort will improve Outcome: Completed/Met   Problem: Safety: Goal: Ability to remain free from injury will improve Outcome: Completed/Met   Problem: Skin Integrity: Goal: Risk for impaired skin integrity will decrease Outcome: Completed/Met   

## 2022-05-23 NOTE — Progress Notes (Cosign Needed)
    HPI: Patient is a 82 year old female who is POD- 1 from a s/p total knee arthroplasty RIGHT Patient seen by Selinda Michaels PA-S with Dr. Alvan Dame and Costella Hatcher PA-C Patient is currently lying comfortably in bed and states that she slept well.  Patient reports that pain is currently well controled with pain medication. However, she states that she was not allowed tylenol overnight. No other acute events overnight. Foley cathter removed with AM. Patient ambulated 2 feet with RW with min assist with PT yesterday.   Teds, SCD's and ice packs are in place. ACE Bandage removed by Dr. Alvan Dame this AM.  Patient denies chest pain, abdominal pain and SHOB.      Vitals reviewed I/O reviewed Labs reviewed Medications reviewed PMH reviewed Images reviewed  Hgb:  low at 10.8, due to acute blood loss anemia Cr: stable at 0.60 K: stable at 4.0     Physical Exam:   General: well appearing female who is alert, cooperative, pleasant and in NAD Skin: warm, dry and intact Resp: Normal effort of respiration, no signs of respiratory distress   MSK: right lower extremity is warm but not erythematous, there is minimal swelling of the right lower extremity localized around the knee joint. There is no ecchymosis. Compartments are soft. Dressing is C/D/I. Patient is able to wiggle toes. Plantarflexion and dorsiflexion are intact.  Neurovascular: sensation and distal pulses are intact in bilateral lower extremities        Assessment: Total knee arthroplasty, Right    Plan: Continue to monitor vitals  Continue PT WBAT Continue DVT Prophylaxis: ASA OPPT scheduled with [facility] Follow-up in the clinic is scheduled for 2 weeks post-op with Dr. Alvan Dame or Costella Hatcher PA-C   Patient to be discharged home today if goals are met with PT and pain is well controlled.  PDMP was reviewed before opioids were prescribed to patient.    Signed Selinda Michaels PA-S

## 2022-05-23 NOTE — Progress Notes (Signed)
Subjective: 1 Day Post-Op Procedure(s) (LRB): TOTAL KNEE ARTHROPLASTY (Right) Patient reports pain as mild.   Patient seen in rounds with Dr. Alvan Dame. Patient is well, and has had no acute complaints or problems. No acute events overnight. Foley catheter removed. Patient ambulated a few feet with PT. She was upset about a discussion she had about pain meds yesterday. She prefers to use tylenol before stepping up to higher meds. We will start therapy today.   Objective: Vital signs in last 24 hours: Temp:  [97.5 F (36.4 C)-98.6 F (37 C)] 97.6 F (36.4 C) (04/03 0541) Pulse Rate:  [58-91] 68 (04/03 0541) Resp:  [13-24] 18 (04/03 0541) BP: (106-163)/(53-95) 127/66 (04/03 0541) SpO2:  [93 %-100 %] 93 % (04/03 0541)  Intake/Output from previous day:  Intake/Output Summary (Last 24 hours) at 05/23/2022 0745 Last data filed at 05/23/2022 0606 Gross per 24 hour  Intake 3544.22 ml  Output 2575 ml  Net 969.22 ml     Intake/Output this shift: No intake/output data recorded.  Labs: Recent Labs    05/23/22 0401  HGB 10.8*   Recent Labs    05/23/22 0401  WBC 10.0  RBC 3.28*  HCT 32.6*  PLT 185   Recent Labs    05/23/22 0401  NA 136  K 4.0  CL 107  CO2 21*  BUN 12  CREATININE 0.60  GLUCOSE 129*  CALCIUM 8.4*   No results for input(s): "LABPT", "INR" in the last 72 hours.  Exam: General - Patient is Alert and Oriented Extremity - Neurologically intact Sensation intact distally Intact pulses distally Dorsiflexion/Plantar flexion intact Dressing - dressing C/D/I Motor Function - intact, moving foot and toes well on exam.   Past Medical History:  Diagnosis Date   ASCUS (atypical squamous cells of undetermined significance) on Pap smear    Neg high risk HPV   Asthma    Atrophic vaginitis    Backache, unspecified    Breast cancer 1992   Chemo and Radiation   Constipation    COPD (chronic obstructive pulmonary disease)    Cystitis    hx of frequent cystitis  per pt at preop on 05/09/22.   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 eye    Lymphocytic colitis    Osteoarthritis    Osteopenia 2012   T score -2.1   Osteoporosis    Palpitations    Personal history of colonic polyps 07/07/2002   hyperplastic   Personal history of malignant neoplasm of breast    Pneumonia    Stroke 2010   2 TIA's    TIA (transient ischemic attack)    Unspecified transient cerebral ischemia     Assessment/Plan: 1 Day Post-Op Procedure(s) (LRB): TOTAL KNEE ARTHROPLASTY (Right) Principal Problem:   S/P total knee arthroplasty, right  Estimated body mass index is 19.22 kg/m as calculated from the following:   Height as of this encounter: 5\' 4"  (1.626 m).   Weight as of this encounter: 50.8 kg. Advance diet Up with therapy D/C IV fluids   Patient's anticipated LOS is less than 2 midnights, meeting these requirements: - Younger than 3 - Lives within 1 hour of care - Has a competent adult at home to recover with post-op recover - NO history of  - Chronic pain requiring opiods  -  Diabetes  - Coronary Artery Disease  - Heart failure  - Heart attack  - Stroke  - DVT/VTE  - Cardiac arrhythmia  - Respiratory Failure/COPD  - Renal failure  - Anemia  - Advanced Liver disease     DVT Prophylaxis -  Plavix Weight bearing as tolerated.  Hgb stable at 10.8 this AM.  Will schedule tylenol while here.   Plan is to go Home after hospital stay. Plan for discharge today following 1-2 sessions of PT as long as they are meeting their goals. Patient is scheduled for OPPT. Follow up in the office in 2 weeks.   First therapy visit scheduled at [facility] on 05/25/22. Patient was not aware of when she starts.  Griffith Citron, PA-C Orthopedic Surgery [phone] 05/23/2022, 7:45 AM

## 2022-05-24 ENCOUNTER — Encounter (HOSPITAL_COMMUNITY): Payer: Self-pay | Admitting: Orthopedic Surgery

## 2022-05-25 DIAGNOSIS — M25561 Pain in right knee: Secondary | ICD-10-CM | POA: Diagnosis not present

## 2022-05-28 DIAGNOSIS — M25561 Pain in right knee: Secondary | ICD-10-CM | POA: Diagnosis not present

## 2022-05-29 NOTE — Discharge Summary (Signed)
Patient ID: Anna York Taddeo MRN: [id number] DOB/AGE: 81/05/1940 81 y.o.  Admit date: 05/22/2022 Discharge date: 05/23/2022  Admission Diagnoses:  Right knee osteoarthritis  Discharge Diagnoses:  Principal Problem:   S/York total knee arthroplasty, right   Past Medical History:  Diagnosis Date   ASCUS (atypical squamous cells of undetermined significance) on Pap smear    Neg high risk HPV   Asthma    Atrophic vaginitis    Backache, unspecified    Breast cancer 1992   Chemo and Radiation   Constipation    COPD (chronic obstructive pulmonary disease)    Cystitis    hx of frequent cystitis per pt at preop on 05/09/22.   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 eye    Lymphocytic colitis    Osteoarthritis    Osteopenia 2012   T score -2.1   Osteoporosis    Palpitations    Personal history of colonic polyps 07/07/2002   hyperplastic   Personal history of malignant neoplasm of breast    Pneumonia    Stroke 2010   2 TIA's    TIA (transient ischemic attack)    Unspecified transient cerebral ischemia     Surgeries: Procedure(s): TOTAL KNEE ARTHROPLASTY on 05/22/2022   Consultants:   Discharged Condition: Improved  Hospital Course: Anna York Aries is an 82 y.o. female who was admitted 05/22/2022 for operative treatment ofS/York total knee arthroplasty, right. Patient has severe unremitting pain that affects sleep, daily activities, and work/hobbies. After pre-op clearance the patient was taken to the operating room on 05/22/2022 and underwent  Procedure(s): TOTAL KNEE ARTHROPLASTY.    Patient was given perioperative antibiotics:  Anti-infectives (From admission, onward)    Start     Dose/Rate Route Frequency Ordered Stop   05/22/22 1630  ceFAZolin (ANCEF) IVPB 2g/100 mL premix        2 g 200 mL/hr over  30 Minutes Intravenous Every 6 hours 05/22/22 1543 05/23/22 1158   05/22/22 0745  ceFAZolin (ANCEF) IVPB 2g/100 mL premix        2 g 200 mL/hr over 30 Minutes Intravenous On call to O.R. 05/22/22 0734 05/22/22 1034        Patient was given sequential compression devices, early ambulation, and chemoprophylaxis to prevent DVT. Patient worked with PT and was meeting their goals regarding safe ambulation and transfers.  Patient benefited maximally from hospital stay and there were no complications.    Recent vital signs: No data found.   Recent laboratory studies: No results for input(s): "WBC", "HGB", "HCT", "PLT", "NA", "K", "CL", "CO2", "BUN", "CREATININE", "GLUCOSE", "INR", "CALCIUM" in the last 72 hours.  Invalid input(s): "PT", "2"   Discharge Medications:   Allergies as of 05/23/2022       Reactions   Erythromycin Nausea And Vomiting   Macrobid [nitrofurantoin] Hives, Rash        Medication List     TAKE these medications    acetaminophen 500 MG tablet Commonly known as: TYLENOL Take 500 mg by mouth every 6 (six) hours as needed (pain.).   albuterol 108 (90 Base) MCG/ACT inhaler Commonly known as: VENTOLIN HFA Inhale 2 puffs into the lungs every 6 (six) hours as needed.   clopidogrel 75 MG tablet Commonly known as: PLAVIX Take 75 mg by mouth in the morning.  dextromethorphan-guaiFENesin 30-600 MG 12hr tablet Commonly known as: MUCINEX DM Take 1 tablet by mouth in the morning. Notes to patient: Resume home medication as prescribed   ibandronate 150 MG tablet Commonly known as: BONIVA Take 150 mg by mouth every 30 (thirty) days. Take in the morning with a full glass of water, on an empty stomach, and do not take anything else by mouth or lie down for the next 30 min. Notes to patient: Resume home medication as indicated   mesalamine 1.2 g EC tablet Commonly known as: LIALDA Take 1 tablet (1.2 g total) by mouth daily with breakfast.   methocarbamol 500 MG  tablet Commonly known as: ROBAXIN Take 1 tablet (500 mg total) by mouth every 6 (six) hours as needed for muscle spasms (muscle pain).   metoprolol succinate 25 MG 24 hr tablet Commonly known as: TOPROL-XL Take 12.5 mg by mouth in the morning.   omeprazole 20 MG capsule Commonly known as: PRILOSEC Take 20 mg by mouth daily before breakfast. Notes to patient: Resume home medication as prescribed   oxyCODONE 5 MG immediate release tablet Commonly known as: Oxy IR/ROXICODONE Take 1 tablet (5 mg total) by mouth every 4 (four) hours as needed for severe pain (pain score 7-10).   polyethylene glycol 17 g packet Commonly known as: MIRALAX / GLYCOLAX Take 17 g by mouth 2 (two) times daily.   rosuvastatin 5 MG tablet Commonly known as: CRESTOR Take 5 mg by mouth in the morning.   Trelegy Ellipta 100-62.5-25 MCG/ACT Aepb Generic drug: Fluticasone-Umeclidin-Vilant INHALE ONE PUFF INTO THE LUNGS DAILY   Vitamin D 50 MCG (2000 UT) Caps Take 2,000 Units by mouth in the morning.               Discharge Care Instructions  (From admission, onward)           Start     Ordered   05/23/22 0000  Change dressing       Comments: Maintain surgical dressing until follow up in the clinic. If the edges start to pull up, may reinforce with tape. If the dressing is no longer working, may remove and cover with gauze and tape, but must keep the area dry and clean.  Call with any questions or concerns.   05/23/22 0752            Diagnostic Studies: No results found.  Disposition: Discharge disposition: 01-Home or Self Care       Discharge Instructions     Call MD / Call 911   Complete by: As directed    If you experience chest pain or shortness of breath, CALL 911 and be transported to the hospital emergency room.  If you develope a fever above 101 F, pus (white drainage) or increased drainage or redness at the wound, or calf pain, call your surgeon's office.   Change dressing    Complete by: As directed    Maintain surgical dressing until follow up in the clinic. If the edges start to pull up, may reinforce with tape. If the dressing is no longer working, may remove and cover with gauze and tape, but must keep the area dry and clean.  Call with any questions or concerns.   Constipation Prevention   Complete by: As directed    Drink plenty of fluids.  Prune juice may be helpful.  You may use a stool softener, such as Colace (over the counter) 100 mg twice a day.  Use MiraLax (over the  counter) for constipation as needed.   Diet - low sodium heart healthy   Complete by: As directed    Increase activity slowly as tolerated   Complete by: As directed    Weight bearing as tolerated with assist device (walker, cane, etc) as directed, use it as long as suggested by your surgeon or therapist, typically at least 4-6 weeks.   Post-operative opioid taper instructions:   Complete by: As directed    POST-OPERATIVE OPIOID TAPER INSTRUCTIONS: It is important to wean off of your opioid medication as soon as possible. If you do not need pain medication after your surgery it is ok to stop day one. Opioids include: Codeine, Hydrocodone(Norco, Vicodin), Oxycodone(Percocet, oxycontin) and hydromorphone amongst others.  Long term and even short term use of opiods can cause: Increased pain response Dependence Constipation Depression Respiratory depression And more.  Withdrawal symptoms can include Flu like symptoms Nausea, vomiting And more Techniques to manage these symptoms Hydrate well Eat regular healthy meals Stay active Use relaxation techniques(deep breathing, meditating, yoga) Do Not substitute Alcohol to help with tapering If you have been on opioids for less than two weeks and do not have pain than it is ok to stop all together.  Plan to wean off of opioids This plan should start within one week post op of your joint replacement. Maintain the same interval or time  between taking each dose and first decrease the dose.  Cut the total daily intake of opioids by one tablet each day Next start to increase the time between doses. The last dose that should be eliminated is the evening dose.      TED hose   Complete by: As directed    Use stockings (TED hose) for 2 weeks on both leg(s).  You may remove them at night for sleeping.        Follow-up Information     Durene Romans, MD. Go on 06/06/2022.   Specialty: Orthopedic Surgery Why: You are scheduled for first post op appt on Wednesday April 17 at 2:45pm. Contact information: [address] [place] [address] [phone]                  Signed: Cassandria Anger 05/29/2022, 7:16 AM

## 2022-05-30 DIAGNOSIS — M25561 Pain in right knee: Secondary | ICD-10-CM | POA: Diagnosis not present

## 2022-06-01 DIAGNOSIS — M25561 Pain in right knee: Secondary | ICD-10-CM | POA: Diagnosis not present

## 2022-06-04 DIAGNOSIS — M25561 Pain in right knee: Secondary | ICD-10-CM | POA: Diagnosis not present

## 2022-06-05 ENCOUNTER — Ambulatory Visit: Payer: PPO | Admitting: Thoracic Surgery (Cardiothoracic Vascular Surgery)

## 2022-06-05 ENCOUNTER — Other Ambulatory Visit: Payer: PPO

## 2022-06-06 DIAGNOSIS — M25561 Pain in right knee: Secondary | ICD-10-CM | POA: Diagnosis not present

## 2022-06-08 DIAGNOSIS — M25561 Pain in right knee: Secondary | ICD-10-CM | POA: Diagnosis not present

## 2022-06-11 DIAGNOSIS — M25561 Pain in right knee: Secondary | ICD-10-CM | POA: Diagnosis not present

## 2022-06-13 DIAGNOSIS — M25561 Pain in right knee: Secondary | ICD-10-CM | POA: Diagnosis not present

## 2022-06-18 DIAGNOSIS — M25561 Pain in right knee: Secondary | ICD-10-CM | POA: Diagnosis not present

## 2022-06-20 DIAGNOSIS — M25561 Pain in right knee: Secondary | ICD-10-CM | POA: Diagnosis not present

## 2022-06-25 DIAGNOSIS — M25561 Pain in right knee: Secondary | ICD-10-CM | POA: Diagnosis not present

## 2022-06-26 ENCOUNTER — Ambulatory Visit
Admission: RE | Admit: 2022-06-26 | Discharge: 2022-06-26 | Disposition: A | Discharge status: Home / Self Care | Payer: PPO | Source: Ambulatory Visit | Attending: Thoracic Surgery (Cardiothoracic Vascular Surgery) | Admitting: Thoracic Surgery (Cardiothoracic Vascular Surgery)

## 2022-06-26 DIAGNOSIS — R918 Other nonspecific abnormal finding of lung field: Secondary | ICD-10-CM

## 2022-06-26 DIAGNOSIS — R911 Solitary pulmonary nodule: Secondary | ICD-10-CM | POA: Diagnosis not present

## 2022-06-26 DIAGNOSIS — Z853 Personal history of malignant neoplasm of breast: Secondary | ICD-10-CM | POA: Diagnosis not present

## 2022-06-26 DIAGNOSIS — J439 Emphysema, unspecified: Secondary | ICD-10-CM | POA: Diagnosis not present

## 2022-06-26 DIAGNOSIS — I700 Atherosclerosis of aorta: Secondary | ICD-10-CM | POA: Diagnosis not present

## 2022-07-03 ENCOUNTER — Encounter: Payer: Self-pay | Admitting: Thoracic Surgery (Cardiothoracic Vascular Surgery)

## 2022-07-03 ENCOUNTER — Ambulatory Visit: Payer: PPO | Admitting: Thoracic Surgery (Cardiothoracic Vascular Surgery)

## 2022-07-03 VITALS — BP 150/78 | HR 88 | Resp 20 | Ht 64.0 in | Wt 112.0 lb

## 2022-07-03 DIAGNOSIS — R918 Other nonspecific abnormal finding of lung field: Secondary | ICD-10-CM

## 2022-07-03 NOTE — Progress Notes (Signed)
[address]       [place] [address]             [phone]     HPI: Anna York returns for follow-up abnormal lung findings on CT chest  Anna York is an 82 year old woman with a history of tobacco abuse, COPD, multiple pneumonias, breast cancer, TIA, reflux, osteopenia, and arthritis.  She had pneumonia in 2023.  A CT of the chest showed right hilar soft tissue opacity and subsegmental atelectasis.  There also was consolidation in the lower lobe, hilar and mediastinal adenopathy, and severe emphysema.  PET/CT was done to further evaluate and there was clearing in the left upper lobe but persistent consolidation in the right upper lobe.  There was only intermediate metabolic uptake.  Her right lower lobe lesion was not active nor were her lymph nodes.  Radiology felt the findings were consistent with infectious and/ or inflammatory process rather than neoplasm.  She has been followed since then.  I last saw her in January 2024.  CT showed some residual nodularity in the right upper lobe stable to slightly smaller from her previous scan.  Architectural distortion in right lower lobe unchanged.  Changes on left side mostly resolved.  No change in her thoracic aortic ectasia.  In the interim since her last visit she has been feeling well.  She has not had any chest pain, pressure, tightness.  No change in her respiratory status  Past Medical History:  Diagnosis Date   ASCUS (atypical squamous cells of undetermined significance) on Pap smear    Neg high risk HPV   Asthma    Atrophic vaginitis    Backache, unspecified    Breast cancer (HCC) 1992   Chemo and Radiation   Constipation    COPD (chronic obstructive pulmonary disease) (HCC)    Cystitis    hx of frequent cystitis per pt at preop on 05/09/22.   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     albuterol (VENTOLIN HFA) 108 (90 Base) MCG/ACT inhaler Inhale 2 puffs into the lungs every 6 (six) hours as needed. 18 g 2   Cholecalciferol (VITAMIN D) 2000 units CAPS Take 2,000 Units by mouth in the morning.     clopidogrel (PLAVIX) 75 MG tablet Take 75 mg by mouth in the morning.     dextromethorphan-guaiFENesin (MUCINEX DM) 30-600 MG 12hr tablet Take 1 tablet by mouth in the morning.     Fluticasone-Umeclidin-Vilant (TRELEGY ELLIPTA) 100-62.5-25 MCG/ACT AEPB INHALE ONE PUFF INTO THE LUNGS DAILY 60 each 11   ibandronate (BONIVA) 150 MG tablet Take 150 mg by mouth every 30 (thirty) days. Take in  the morning with a full glass of water, on an empty stomach, and do not take anything else by mouth or lie down for the next 30 min.     mesalamine (LIALDA) 1.2 g EC tablet Take 1 tablet (1.2 g total) by mouth daily with breakfast. 30 tablet 0   methocarbamol (ROBAXIN) 500 MG tablet Take 1 tablet (500 mg total) by mouth every 6 (six) hours as needed for muscle spasms (muscle pain). 40 tablet 2   metoprolol succinate (TOPROL-XL) 25 MG 24 hr tablet Take 12.5 mg by mouth in the morning.     omeprazole (PRILOSEC) 20 MG capsule Take 20 mg by mouth daily before breakfast.     oxyCODONE (OXY IR/ROXICODONE) 5 MG immediate release tablet Take 1 tablet (5 mg total) by mouth every 4 (four) hours as needed for severe pain  (pain score 7-10). 42 tablet 0   polyethylene glycol (MIRALAX / GLYCOLAX) 17 g packet Take 17 g by mouth 2 (two) times daily. 14 each 0   rosuvastatin (CRESTOR) 5 MG tablet Take 5 mg by mouth in the morning.     No current facility-administered medications for this visit.    Physical Exam BP (!) 150/78   Pulse 88   Resp 20   Ht 5\' 4"  (1.626 m)   Wt 112 lb (50.8 kg)   SpO2 95% Comment: RA  BMI 19.26 kg/m  82 year old woman in no acute distress Alert and oriented x 3 with no focal deficits Lungs diminished breath sounds bilaterally with no rales or wheezing No cervical or supraclavicular adenopathy Cardiac regular rate and rhythm, no murmur No peripheral edema  Diagnostic Tests: CT CHEST WITHOUT CONTRAST   TECHNIQUE: Multidetector CT imaging of the chest was performed following the standard protocol without IV contrast.   RADIATION DOSE REDUCTION: This exam was performed according to the departmental dose-optimization program which includes automated exposure control, adjustment of the mA and/or kV according to patient size and/or use of iterative reconstruction technique.   COMPARISON:  Chest CT dated March 08, 2022   FINDINGS: Cardiovascular: Normal heart size. No pericardial effusion. Normal caliber thoracic aorta with moderate calcified plaque. Coronary artery calcifications.   Mediastinum/Nodes: Small hiatal hernia. Thyroid is unremarkable. No enlarged lymph nodes seen in the chest.   Lungs/Pleura: Central airways are patent. Severe centrilobular emphysema. Irregular consolidation of the right lower lobe is unchanged in size, measuring up to 2.7 cm. Stable solid nodules of the posterior right upper lobe measuring 19 x 10 mm on series 8, image 43 and 9 mm on image 39 unchanged adjacent linear consolidation. New ground-glass nodule of the left upper lobe measuring 11 mm on series 8, image 65. Additional scattered small solid pulmonary nodules are stable.  Reference subpleural solid nodule of the left lower lobe measuring 5 mm on image 115. No pleural effusion.   Upper Abdomen: No acute abnormality.   Musculoskeletal: No chest wall mass or suspicious bone lesions identified.   IMPRESSION: 1. Stable nodular opacities of the right upper lobe and irregular right lower lobe consolidation, possibly nodular scarring. Recommend continued surveillance with follow-up chest CT in 6 months. Repeat PET-CT could also be considered. 2. New ground-glass nodule of the left upper lobe measuring 11 mm, likely infectious or inflammatory. Recommend attention on follow-up. 3. Aortic Atherosclerosis (ICD10-I70.0) and Emphysema (ICD10-J43.9).     Electronically Signed   By: Allegra Lai M.D.   On: 06/26/2022 13:59   I personally reviewed the CT images.  Findings on  right are stable.  New 11 mm groundglass opacity in the central left upper lobe.  Severe emphysema with architectural distortion.  Impression: Anna York is an 82 year old woman with a history of tobacco abuse, COPD, multiple pneumonias, breast cancer, TIA, reflux, osteopenia, and arthritis.  Has been followed since October 2023 with multiple pulmonary findings on CT chest.  Right upper lobe "nodules"-residual from an area of significantly greater atelectasis previously.  No increase in size.  Will continue to follow but unlikely to be neoplasms.  Right lower lobe nodule-she has architectural distortion in the right lower lobe with some groundglass component.  It was not metabolically active on PET, but cannot completely rule out the possibility of a low-grade adenocarcinoma.  Needs continued follow-up.  Left upper lobe groundglass opacity-new from her prior CT.  Will plan to scan again in 6 months.  Emphysema-symptoms stable  Plan: Return in 6 months with CT chest  I spent over 20 minutes in review of records, images, and in consultation with Anna York today. Loreli Slot, MD [facility] [phone]

## 2022-07-05 DIAGNOSIS — Z96651 Presence of right artificial knee joint: Secondary | ICD-10-CM | POA: Diagnosis not present

## 2022-07-05 DIAGNOSIS — Z471 Aftercare following joint replacement surgery: Secondary | ICD-10-CM | POA: Diagnosis not present

## 2022-07-10 DIAGNOSIS — R0602 Shortness of breath: Secondary | ICD-10-CM | POA: Diagnosis not present

## 2022-07-10 DIAGNOSIS — J441 Chronic obstructive pulmonary disease with (acute) exacerbation: Secondary | ICD-10-CM | POA: Diagnosis not present

## 2022-07-10 DIAGNOSIS — G459 Transient cerebral ischemic attack, unspecified: Secondary | ICD-10-CM | POA: Diagnosis not present

## 2022-07-10 DIAGNOSIS — M1990 Unspecified osteoarthritis, unspecified site: Secondary | ICD-10-CM | POA: Diagnosis not present

## 2022-07-10 DIAGNOSIS — K921 Melena: Secondary | ICD-10-CM | POA: Diagnosis not present

## 2022-07-10 DIAGNOSIS — E785 Hyperlipidemia, unspecified: Secondary | ICD-10-CM | POA: Diagnosis not present

## 2022-07-10 DIAGNOSIS — I10 Essential (primary) hypertension: Secondary | ICD-10-CM | POA: Diagnosis not present

## 2022-07-10 DIAGNOSIS — M810 Age-related osteoporosis without current pathological fracture: Secondary | ICD-10-CM | POA: Diagnosis not present

## 2022-07-30 DIAGNOSIS — Z1231 Encounter for screening mammogram for malignant neoplasm of breast: Secondary | ICD-10-CM | POA: Diagnosis not present

## 2022-08-08 ENCOUNTER — Ambulatory Visit: Payer: PPO | Admitting: Internal Medicine

## 2022-08-08 ENCOUNTER — Encounter: Payer: Self-pay | Admitting: Internal Medicine

## 2022-08-08 VITALS — BP 100/70 | HR 79 | Ht 64.0 in | Wt 112.2 lb

## 2022-08-08 DIAGNOSIS — R152 Fecal urgency: Secondary | ICD-10-CM | POA: Diagnosis not present

## 2022-08-08 DIAGNOSIS — R151 Fecal smearing: Secondary | ICD-10-CM | POA: Diagnosis not present

## 2022-08-08 DIAGNOSIS — R198 Other specified symptoms and signs involving the digestive system and abdomen: Secondary | ICD-10-CM | POA: Diagnosis not present

## 2022-08-08 NOTE — Progress Notes (Signed)
   Subjective:    Patient ID: Anna York, female    DOB: [birth year], 82 y.o.   MRN: [id number]  HPI Anna York is an 82 year old female with a history of lymphocytic colitis, colonic diverticulosis, breast cancer, COPD, lung nodules who is here for follow-up.  I last saw her on 04/27/2022.  She is here alone today.  When I last saw her she was getting over an acute diarrhea with gas and flatulence.  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  We treated her with mesalamine for a few weeks and while it is still on her med list she thinks she stopped this.  She has been doing and feeling well.  The bloating and diarrhea has all resolved.  In fact recently she has had more of an incomplete type bowel movement and a slight sense of urgency after her bowel movements.  Bowel movements can be hard and small but no bleeding.  She has had some minor fecal smearing throughout the day.  No belly pain.  No blood in stool or melena.  No upper GI or hepatobiliary complaint.  She had her right knee replaced with Dr. Charlann Boxer and this went very well.  She took MiraLAX once a day postsurgery and noticed that her bowel movements were complete and more normal.  She thought about starting it back but wanted to talk to me first.   Review of Systems As per HPI, otherwise negative  Current Medications, Allergies, Past Medical History, Past Surgical History, Family History and Social History were reviewed in [employer] record.    Objective:   Physical Exam BP 100/70   Pulse 79   Ht 5\' 4"  (1.626 m)   Wt 112 lb 4 oz (50.9 kg)   SpO2 97%   BMI 19.27 kg/m  Gen: awake, alert, NAD Pulm: CTA b/l Abd: soft, NT/ND, +BS throughout Ext: no c/c/e Neuro: nonfocal     Assessment & Plan:  82 year old female with a history of lymphocytic colitis, colonic diverticulosis, breast cancer, COPD, lung nodules who is here for follow-up.  Resolved postinfectious IBS/incomplete bowel  movement/history of lymphocytic colitis--mild constipation and incomplete bowel movement.  Certainly no evidence of recurrent lymphocytic colitis. -- Stop mesalamine (may have already been stopped but she will check at home) -- Resume MiraLAX 17 g once daily which was previously very effective for her  2.  Colon cancer screening --previously deferred based on age.  See discussion at last office visit.  PET scan August 2023 reassuring from a colonic standpoint (done for another reason)  Follow-up as needed  20 minutes total spent today including patient facing time, coordination of care, reviewing medical history/procedures/pertinent radiology studies, and documentation of the encounter.

## 2022-08-08 NOTE — Patient Instructions (Signed)
Stop taking mesalamine (Lialda).  Restart over the counter Miralax 17 grams daily.   _______________________________________________________  If your blood pressure at your visit was 140/90 or greater, please contact your primary care physician to follow up on this.  _______________________________________________________  If you are age 81 or older, your body mass index should be between 23-30. Your Body mass index is 19.27 kg/m. If this is out of the aforementioned range listed, please consider follow up with your Primary Care Provider.  If you are age 49 or younger, your body mass index should be between 19-25. Your Body mass index is 19.27 kg/m. If this is out of the aformentioned range listed, please consider follow up with your Primary Care Provider.   ________________________________________________________  The [facility] providers would like to encourage you to use [facility] to communicate with providers for non-urgent requests or questions.  Due to long hold times on the telephone, sending your provider a message by [facility] may be a faster and more efficient way to get a response.  Please allow 48 business hours for a response.  Please remember that this is for non-urgent requests.  _______________________________________________________

## 2022-09-04 DIAGNOSIS — H903 Sensorineural hearing loss, bilateral: Secondary | ICD-10-CM | POA: Diagnosis not present

## 2022-09-17 DIAGNOSIS — H353221 Exudative age-related macular degeneration, left eye, with active choroidal neovascularization: Secondary | ICD-10-CM | POA: Diagnosis not present

## 2022-10-24 DIAGNOSIS — H353221 Exudative age-related macular degeneration, left eye, with active choroidal neovascularization: Secondary | ICD-10-CM | POA: Diagnosis not present

## 2022-11-23 DIAGNOSIS — H5213 Myopia, bilateral: Secondary | ICD-10-CM | POA: Diagnosis not present

## 2022-11-23 DIAGNOSIS — H903 Sensorineural hearing loss, bilateral: Secondary | ICD-10-CM | POA: Diagnosis not present

## 2022-11-23 DIAGNOSIS — Z961 Presence of intraocular lens: Secondary | ICD-10-CM | POA: Diagnosis not present

## 2022-11-24 DIAGNOSIS — Z23 Encounter for immunization: Secondary | ICD-10-CM | POA: Diagnosis not present

## 2022-11-29 ENCOUNTER — Other Ambulatory Visit: Payer: Self-pay | Admitting: Thoracic Surgery (Cardiothoracic Vascular Surgery)

## 2022-11-29 DIAGNOSIS — R918 Other nonspecific abnormal finding of lung field: Secondary | ICD-10-CM

## 2022-12-03 DIAGNOSIS — H353221 Exudative age-related macular degeneration, left eye, with active choroidal neovascularization: Secondary | ICD-10-CM | POA: Diagnosis not present

## 2022-12-31 ENCOUNTER — Other Ambulatory Visit: Payer: PPO

## 2022-12-31 DIAGNOSIS — H903 Sensorineural hearing loss, bilateral: Secondary | ICD-10-CM | POA: Diagnosis not present

## 2023-01-01 ENCOUNTER — Ambulatory Visit
Admission: RE | Admit: 2023-01-01 | Discharge: 2023-01-01 | Disposition: A | Discharge status: Home / Self Care | Payer: PPO | Source: Ambulatory Visit | Attending: Thoracic Surgery (Cardiothoracic Vascular Surgery) | Admitting: Thoracic Surgery (Cardiothoracic Vascular Surgery)

## 2023-01-01 DIAGNOSIS — R918 Other nonspecific abnormal finding of lung field: Secondary | ICD-10-CM | POA: Diagnosis not present

## 2023-01-01 DIAGNOSIS — I700 Atherosclerosis of aorta: Secondary | ICD-10-CM | POA: Diagnosis not present

## 2023-01-01 DIAGNOSIS — J432 Centrilobular emphysema: Secondary | ICD-10-CM | POA: Diagnosis not present

## 2023-01-02 DIAGNOSIS — H353221 Exudative age-related macular degeneration, left eye, with active choroidal neovascularization: Secondary | ICD-10-CM | POA: Diagnosis not present

## 2023-01-08 ENCOUNTER — Other Ambulatory Visit (HOSPITAL_BASED_OUTPATIENT_CLINIC_OR_DEPARTMENT_OTHER): Payer: Self-pay | Admitting: Pulmonary Disease

## 2023-01-08 ENCOUNTER — Ambulatory Visit: Payer: PPO | Admitting: Thoracic Surgery (Cardiothoracic Vascular Surgery)

## 2023-01-08 ENCOUNTER — Encounter: Payer: Self-pay | Admitting: Thoracic Surgery (Cardiothoracic Vascular Surgery)

## 2023-01-08 VITALS — BP 159/82 | HR 89 | Resp 20 | Ht 64.0 in | Wt 115.0 lb

## 2023-01-08 DIAGNOSIS — R918 Other nonspecific abnormal finding of lung field: Secondary | ICD-10-CM

## 2023-01-08 NOTE — Progress Notes (Unsigned)
[address]       [place] [address]             [phone]     HPI: Anna York returns for follow-up of multiple lung nodules.  Anna York is an 82 year old woman with a history of tobacco abuse (quit 2002), COPD, multiple pneumonias, breast cancer, TIA, reflux, osteopenia, osteoarthritis, and multiple pulmonary nodules.  She had pneumonia in 2003.  CT of the chest showed right hilar soft tissue opacity and subsegmental atelectasis.  There was hilar and mediastinal adenopathy, consolidation in the lower lobe, and severe emphysema.  PET/CT was done and it showed clearing of the left upper lobe but persistent consolidation in the right upper lobe.  There is a complex lesion in the right lower lobe that was not metabolically active.  I last saw her in May.  Right upper and lower lobe findings were stable.  There was a new groundglass opacity in the left upper lobe, so she was scheduled for 38-month follow-up.  Respiratory status has been stable.  She does have a cough.  Has not had any COPD exacerbations.  Past Medical History:  Diagnosis Date  . ASCUS (atypical squamous cells of undetermined significance) on Pap smear    Neg high risk HPV  . Asthma   . Atrophic vaginitis   . Backache, unspecified   . Breast cancer (HCC) 1992   Chemo and Radiation  . Constipation   . COPD (chronic obstructive pulmonary disease) (HCC)   . Cystitis    hx of frequent cystitis per pt at preop on 05/09/22.  Marland Kitchen Cystitis, unspecified   . Diverticulosis   . Dysphagia   . Dyspnea    with exertion  . GERD (gastroesophageal reflux disease)    indigestion  . 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 Lymphocytic colitis   . Osteoarthritis   . Osteopenia 2012   T score -2.1  . Osteoporosis   . Palpitations   . Personal history of colonic polyps 07/07/2002   hyperplastic  . Personal history of malignant neoplasm of breast   .  Pneumonia   . Stroke [facility]) 2010   2 TIA's   . TIA (transient ischemic attack)   . Unspecified transient cerebral ischemia     Current Outpatient Medications  Medication Sig Dispense Refill  . acetaminophen (TYLENOL) 500 MG tablet Take 500 mg by mouth every 6 (six) hours as needed (pain.).    Marland Kitchen albuterol (VENTOLIN HFA) 108 (90 Base) MCG/ACT inhaler Inhale 2 puffs into the lungs every 6 (six) hours as needed. 18 g 2  . Cholecalciferol (VITAMIN D) 2000 units CAPS Take 2,000 Units by mouth in the morning.    . clopidogrel (PLAVIX) 75 MG tablet Take 75 mg by mouth in the morning.    Marland Kitchen dextromethorphan-guaiFENesin (MUCINEX DM) 30-600 MG 12hr tablet Take 1 tablet by mouth in the morning.    . Fluticasone-Umeclidin-Vilant (TRELEGY ELLIPTA) 100-62.5-25 MCG/ACT AEPB INHALE ONE PUFF INTO THE LUNGS DAILY 60 each 11  . ibandronate (BONIVA) 150 MG tablet Take 150 mg by mouth every 30 (thirty) days. Take in the morning with a full glass of water, on an empty stomach, and do not take anything else by mouth or lie down for the next 30 min.    . metoprolol succinate (TOPROL-XL) 25 MG 24 hr tablet Take 12.5 mg by mouth in the morning.    Marland Kitchen omeprazole (PRILOSEC) 20 MG  capsule Take 20 mg by mouth daily before breakfast.    . polyethylene glycol (MIRALAX / GLYCOLAX) 17 g packet Take 17 g by mouth 2 (two) times daily. 14 each 0  . rosuvastatin (CRESTOR) 5 MG tablet Take 5 mg by mouth in the morning.     No current facility-administered medications for this visit.    Physical Exam BP (!) 159/82 (BP Location: Left Arm, Patient Position: Sitting)   Pulse 89   Resp 20   Ht 5\' 4"  (1.626 m)   Wt 115 lb (52.2 kg)   SpO2 95% Comment: RA  BMI 19.48 kg/m  82 year old woman in no acute distress Alert and oriented x 3 with no focal deficits Lungs breath sounds bilaterally, no wheezing Cardiac regular rate and rhythm No peripheral edema  Diagnostic Tests: CT CHEST WITHOUT CONTRAST   TECHNIQUE: Multidetector  CT imaging of the chest was performed following the standard protocol without IV contrast.   RADIATION DOSE REDUCTION: This exam was performed according to the departmental dose-optimization program which includes automated exposure control, adjustment of the mA and/or kV according to patient size and/or use of iterative reconstruction technique.   COMPARISON:  06/26/2022   FINDINGS: Cardiovascular: The heart is normal in size. No pericardial effusion.   No evidence of thoracic aortic aneurysm. Atherosclerotic calcifications of the aortic arch.   Mediastinum/Nodes: No suspicious mediastinal lymphadenopathy.   Visualized thyroid is unremarkable.   Lungs/Pleura: 18 mm posterior right upper lobe nodule (series/image 51), unchanged. Additional 9 mm nodule in the posterior right upper lobe along the major fissure (series 5/image 48), unchanged.   4.1 x 3.3 cm cystic/cavitary lesion in the right lower lobe (series 5/image 32), with mild irregular wall thickening, unchanged.   7 mm subpleural left lower lobe nodular opacity (series 5/image 109), new. Additional 5 mm subpleural nodule in the left lower lobe (series 5/image 135), unchanged.   Severe centrilobular and paraseptal emphysematous changes, upper lung predominant.   No focal consolidation.   No pleural effusion or pneumothorax.   Upper Abdomen: Visualized upper abdomen is notable for vascular calcifications and a 4.4 cm simple right upper pole renal cyst (series 2/image 162), benign.   Musculoskeletal: Degenerative changes of the visualized thoracolumbar spine.   IMPRESSION: 7 mm subpleural left lower lobe nodular opacity, new. Follow-up CT chest is suggested in 3 months.   Additional stable right upper and lower lobe nodules, as above. Attention on follow-up is suggested.   Aortic Atherosclerosis (ICD10-I70.0) and Emphysema (ICD10-J43.9).     Electronically Signed   By: Charline Bills M.D.   On:  01/01/2023 23:36   I personally reviewed the CT images.  Complex scan with severe emphysema and scarring.  Multiple lung nodules that are stable.  New 7 mm solid left lower lobe nodule.  Impression: Anna York is an 82 year old woman with a history of tobacco abuse (quit 2002), COPD, multiple pneumonias, breast cancer, TIA, reflux, osteopenia, osteoarthritis, and multiple pulmonary nodules.  Right upper lobe nodules-an area of previous pneumonia.  Stable.  Continue to monitor.  Right lower lobe "nodule"-architectural distortion with the groundglass component.  Not metabolically active but still concerning for possible low-grade adenocarcinoma.  No interval change.  Left upper lobe groundglass opacity-improved.  Left lower lobe lung nodule-new from 6 months ago.  Will repeat a CT in 3 months.  COPD-stable respiratory status  Plan: Return in 3 months with CT chest  I spent over 20 minutes in review of records, images, and in consultation with  Anna York today Loreli Slot, MD [facility] [phone]

## 2023-01-14 DIAGNOSIS — Z79899 Other long term (current) drug therapy: Secondary | ICD-10-CM | POA: Diagnosis not present

## 2023-01-14 DIAGNOSIS — E785 Hyperlipidemia, unspecified: Secondary | ICD-10-CM | POA: Diagnosis not present

## 2023-01-14 DIAGNOSIS — M810 Age-related osteoporosis without current pathological fracture: Secondary | ICD-10-CM | POA: Diagnosis not present

## 2023-01-14 DIAGNOSIS — E559 Vitamin D deficiency, unspecified: Secondary | ICD-10-CM | POA: Diagnosis not present

## 2023-01-14 DIAGNOSIS — I10 Essential (primary) hypertension: Secondary | ICD-10-CM | POA: Diagnosis not present

## 2023-01-21 DIAGNOSIS — N952 Postmenopausal atrophic vaginitis: Secondary | ICD-10-CM | POA: Diagnosis not present

## 2023-01-21 DIAGNOSIS — J449 Chronic obstructive pulmonary disease, unspecified: Secondary | ICD-10-CM | POA: Diagnosis not present

## 2023-01-21 DIAGNOSIS — R413 Other amnesia: Secondary | ICD-10-CM | POA: Diagnosis not present

## 2023-01-21 DIAGNOSIS — M19011 Primary osteoarthritis, right shoulder: Secondary | ICD-10-CM | POA: Diagnosis not present

## 2023-01-21 DIAGNOSIS — Z0000 Encounter for general adult medical examination without abnormal findings: Secondary | ICD-10-CM | POA: Diagnosis not present

## 2023-01-21 DIAGNOSIS — G459 Transient cerebral ischemic attack, unspecified: Secondary | ICD-10-CM | POA: Diagnosis not present

## 2023-01-21 DIAGNOSIS — E785 Hyperlipidemia, unspecified: Secondary | ICD-10-CM | POA: Diagnosis not present

## 2023-01-21 DIAGNOSIS — M810 Age-related osteoporosis without current pathological fracture: Secondary | ICD-10-CM | POA: Diagnosis not present

## 2023-01-21 DIAGNOSIS — Z1331 Encounter for screening for depression: Secondary | ICD-10-CM | POA: Diagnosis not present

## 2023-01-21 DIAGNOSIS — R0602 Shortness of breath: Secondary | ICD-10-CM | POA: Diagnosis not present

## 2023-01-21 DIAGNOSIS — I10 Essential (primary) hypertension: Secondary | ICD-10-CM | POA: Diagnosis not present

## 2023-01-21 DIAGNOSIS — M1990 Unspecified osteoarthritis, unspecified site: Secondary | ICD-10-CM | POA: Diagnosis not present

## 2023-01-22 ENCOUNTER — Encounter (HOSPITAL_BASED_OUTPATIENT_CLINIC_OR_DEPARTMENT_OTHER): Payer: Self-pay | Admitting: Pulmonary Disease

## 2023-01-22 ENCOUNTER — Ambulatory Visit (HOSPITAL_BASED_OUTPATIENT_CLINIC_OR_DEPARTMENT_OTHER): Payer: PPO | Admitting: Pulmonary Disease

## 2023-01-22 VITALS — BP 120/78 | HR 73 | Resp 14 | Ht 64.0 in | Wt 116.6 lb

## 2023-01-22 DIAGNOSIS — J432 Centrilobular emphysema: Secondary | ICD-10-CM | POA: Diagnosis not present

## 2023-01-22 MED ORDER — ALBUTEROL SULFATE HFA 108 (90 BASE) MCG/ACT IN AERS: 2.0000 | INHALATION_SPRAY | Freq: Four times a day (QID) | RESPIRATORY_TRACT | 2 refills | Status: AC | PRN

## 2023-01-22 MED ORDER — TRELEGY ELLIPTA 100-62.5-25 MCG/ACT IN AEPB: INHALATION_SPRAY | RESPIRATORY_TRACT | 0 refills | Status: DC

## 2023-01-22 MED ORDER — TRELEGY ELLIPTA 100-62.5-25 MCG/ACT IN AEPB: INHALATION_SPRAY | RESPIRATORY_TRACT | 11 refills | Status: AC

## 2023-01-22 NOTE — Patient Instructions (Signed)
Multiple nodules with new opacity in LLL measuring 7mm --CT chest in 3 months per CT Surgery with Dr. Dorris Fetch  Emphysema - overall well controlled --CONTINUE Trelegy 100 mcg ONE puff ONCE a day. REFILLED --CONTINUE Albuterol AS NEEDED for shortness of breath or wheezing --Continue flutter valve as needed. Use this twice a day. 5 inhalations at a time for mucous production --Continue mucinex 600 mg daily as needed. You can purchase this over-the-counter --ORDER pulmonary function tests

## 2023-01-22 NOTE — Progress Notes (Signed)
Subjective:   PATIENT ID: Anna York GENDER: female DOB: 09-24-1940, MRN: [id number]  Chief Complaint  Patient presents with   Follow-up    COPD- Doing about the same. The breathing is a little worse than last year not much,     Reason for Visit: Follow-up  Anna York is 82 year old with COPD, emphysema, osteoporosis, TIA, hx breast cancer who presents for follow-upl  Initial consult She was previously followed by Dr. Delton Coombes for COPD. She is compliant with Trelegy daily. She continues to have mucous production especially after her pneumonia in the summer. Denies shortness of breath or wheezing.   She recently was seen by CTS for abnormal CT with bilateral pulmonary infiltrates. Earlier this summer she was treated for pneumonia and had a CXR demonstrating right upper lobe and left lower lobe abnormalities. Follow-up CT and PET/CT were ordered and demonstrated right upper lobe atelectasis and left lower lobe consolidation - that was improved on latest scan.  01/13/22 Stable on Trelegy.  Flutter valve has been helping with mucus production.  Has not yet picked up Mucinex.  COPD otherwise is well-controlled, denies shortness of breath or wheezing.  No limitation in activity.  She was recently seen by Dr. Dorris Fetch cardiothoracic clinic.  Note reviewed from 12/12/2021.  01/22/23 Since our last visit she reports doing fine with the Trelegy. Previously used flutter valve but has not needed it in recent months and has not had mucous production. Uses mucinex daily which helps. Has some shortness of breath with activity. No coughing or wheezing. No limitation in activity. No exacerbations in the last year.  Social History: Former smoker. 30 pack- years. Quit >20 years ago.  Past Medical History:  Diagnosis Date   ASCUS (atypical squamous cells of undetermined significance) on Pap smear    Neg high risk HPV   Asthma    Atrophic vaginitis    Backache, unspecified     Breast cancer (HCC) 1992   Chemo and Radiation   Constipation    COPD (chronic obstructive pulmonary disease) (HCC)    Cystitis    hx of frequent cystitis per pt at preop on 05/09/22.   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 eye    Lymphocytic colitis    Osteoarthritis    Osteopenia 2012   T score -2.1   Osteoporosis    Palpitations    Personal history of colonic polyps 07/07/2002   hyperplastic   Personal history of malignant neoplasm of breast    Pneumonia    Stroke (HCC) 2010   2 TIA's    TIA (transient ischemic attack)    Unspecified transient cerebral ischemia      Family History  Problem Relation Age of Onset   Breast cancer Mother        Age 54   Hypertension Father      Social History   Occupational History   Occupation: Veterinary surgeon    Comment: [employer]    Employer: [employer]  Tobacco Use   Smoking status: Former    Current packs/day: 0.00    Average packs/day: 1 pack/day for 30.0 years (30.0 ttl pk-yrs)    Types: Cigarettes    Start date: 02/19/1970    Quit date: 02/20/2000    Years since quitting: 22.9  Smokeless tobacco: Never  Vaping Use   Vaping status: Never Used  Substance and Sexual Activity   Alcohol use: Yes    Alcohol/week: 14.0 standard drinks of alcohol    Types: 14 Glasses of wine per week    Comment: 3 glsses wine daily   Drug use: No   Sexual activity: Not Currently    Birth control/protection: Post-menopausal    Comment: 1st intercourse 82 yo-Fewer than 5 partners    Allergies  Allergen Reactions   Erythromycin Nausea And Vomiting   Macrobid [Nitrofurantoin] Hives and Rash     Outpatient Medications Prior to Visit  Medication Sig Dispense Refill   acetaminophen (TYLENOL) 500 MG tablet Take 500 mg by mouth every 6 (six) hours as needed (pain.).      albuterol (VENTOLIN HFA) 108 (90 Base) MCG/ACT inhaler Inhale 2 puffs into the lungs every 6 (six) hours as needed. 18 g 2   Cholecalciferol (VITAMIN D) 2000 units CAPS Take 2,000 Units by mouth in the morning.     clopidogrel (PLAVIX) 75 MG tablet Take 75 mg by mouth in the morning.     dextromethorphan-guaiFENesin (MUCINEX DM) 30-600 MG 12hr tablet Take 1 tablet by mouth in the morning.     ibandronate (BONIVA) 150 MG tablet Take 150 mg by mouth every 30 (thirty) days. Take in the morning with a full glass of water, on an empty stomach, and do not take anything else by mouth or lie down for the next 30 min.     metoprolol succinate (TOPROL-XL) 25 MG 24 hr tablet Take 12.5 mg by mouth in the morning.     omeprazole (PRILOSEC) 20 MG capsule Take 20 mg by mouth daily before breakfast.     polyethylene glycol (MIRALAX / GLYCOLAX) 17 g packet Take 17 g by mouth 2 (two) times daily. 14 each 0   rosuvastatin (CRESTOR) 5 MG tablet Take 5 mg by mouth in the morning.     Fluticasone-Umeclidin-Vilant (TRELEGY ELLIPTA) 100-62.5-25 MCG/ACT AEPB INHALE ONE PUFF into THE lungs DAILY 60 each 0   No facility-administered medications prior to visit.    Review of Systems  Constitutional:  Negative for chills, diaphoresis, fever, malaise/fatigue and weight loss.  HENT:  Negative for congestion.   Respiratory:  Negative for cough, hemoptysis, sputum production, shortness of breath and wheezing.   Cardiovascular:  Negative for chest pain, palpitations and leg swelling.     Objective:   Vitals:   01/22/23 1329  BP: 120/78  Pulse: 73  Resp: 14  SpO2: 98%  Weight: 116 lb 9.6 oz (52.9 kg)  Height: 5\' 4"  (1.626 m)   SpO2: 98 %  Physical Exam: General: Well-appearing, no acute distress HENT: [place], AT Eyes: EOMI, no scleral icterus Respiratory: Clear to auscultation bilaterally.  No crackles, wheezing or rales Cardiovascular: RRR, -M/R/G, no JVD Extremities:-Edema,-tenderness Neuro: AAO x4, CNII-XII  grossly intact Psych: Normal mood, normal affect   Data Reviewed:  Imaging: CT Chest 10/09/21 - RUL subsegmental atelectasis. Right hilar opacity measuring 3.1 x 1.4 cm. LLL consolidation. Emphysema with upper lobe predominance. LLL with honeycombed appearance vs interstitial thickening of previously seen emphysema in 03/02/17 PET/CT 10/19/21 - Partial right upper lobe collapse. Interval left lower lobe consolidation improved compared to prior CT 10/09/21 CT Chest 12/12/21 - Decrease in RUL opacity, unchanged RLL scarring. Near resolution of LLL consdolidation. Improved mediastinal adenopathy CT Chest 01/01/23 - New LLL subpleural nodule measuring 7mm. Stable prior nodules  PFT: 03/26/17 FVC 2.63 (93%) FEV1  1.41 (67%) Ratio 54  TLC 120% DLCO not complete Interpretation: Moderate obstructive defect with air trapping and hyperinflation  Labs: CBC    Component Value Date/Time   WBC 10.0 05/23/2022 0401   RBC 3.28 (L) 05/23/2022 0401   HGB 10.8 (L) 05/23/2022 0401   HGB 13.6 01/21/2018 1403   HGB 13.5 01/15/2017 1427   HCT 32.6 (L) 05/23/2022 0401   HCT 40.9 01/15/2017 1427   PLT 185 05/23/2022 0401   PLT 240 01/21/2018 1403   PLT 232 01/15/2017 1427   MCV 99.4 05/23/2022 0401   MCV 95.1 01/15/2017 1427   MCH 32.9 05/23/2022 0401   MCHC 33.1 05/23/2022 0401   RDW 13.3 05/23/2022 0401   RDW 13.8 01/15/2017 1427   LYMPHSABS 0.7 01/21/2018 1403   LYMPHSABS 0.5 (L) 01/15/2017 1427   MONOABS 0.7 01/21/2018 1403   MONOABS 0.6 01/15/2017 1427   EOSABS 0.0 01/21/2018 1403   EOSABS 0.0 01/15/2017 1427   BASOSABS 0.1 01/21/2018 1403   BASOSABS 0.0 01/15/2017 1427   Absolute eosinophils - 0 01/2018     Assessment & Plan:   Discussion: 82 year old with COPD, emphysema, osteoporosis, TIA, hx breast cancer who presents for follow-up. Emphysema well controlled with breakthrough mucous production that is controlled by OTC. Reviewed CT scan with new LLL subcentimeter nodule that is being  followed by CT surgery.   Multiple nodules with new opacity in LLL measuring 7mm --CT chest in 3 months per CT Surgery with Dr. Dorris Fetch  Emphysema - overall well controlled --CONTINUE Trelegy 100 mcg ONE puff ONCE a day. REFILLED --CONTINUE Albuterol AS NEEDED for shortness of breath or wheezing --Continue flutter valve as needed. Use this twice a day. 5 inhalations at a time for mucous production --Continue mucinex 600 mg daily as needed. You can purchase this over-the-counter --ORDER pulmonary function tests  Health Maintenance Immunization History  Administered Date(s) Administered   Influenza Split 11/20/2010, 12/10/2011, 11/19/2012   Influenza, High Dose Seasonal PF 10/21/2018, 11/25/2021   Influenza,inj,Quad PF,6+ Mos 11/19/2014   Influenza,inj,quad, With Preservative 11/19/2016   Influenza-Unspecified 12/03/2016, 11/24/2022   PFIZER(Purple Top)SARS-COV-2 Vaccination 03/10/2019, 03/28/2019, 11/04/2019   Pneumococcal Polysaccharide-23 02/19/2010   Pneumococcal-Unspecified 02/20/2015   Zoster Recombinant(Shingrix) 05/02/2018, 08/11/2018   Zoster, Live 02/20/2007   CT Lung Screen - not qualified due to age.  No orders of the defined types were placed in this encounter.  Meds ordered this encounter  Medications   DISCONTD: Fluticasone-Umeclidin-Vilant (TRELEGY ELLIPTA) 100-62.5-25 MCG/ACT AEPB    Sig: INHALE ONE PUFF INTO THE LUNGS DAILY    Dispense:  60 each    Refill:  0    NEEDS OV FOR FUTURE REFILLS   Fluticasone-Umeclidin-Vilant (TRELEGY ELLIPTA) 100-62.5-25 MCG/ACT AEPB    Sig: INHALE ONE PUFF INTO THE LUNGS DAILY    Dispense:  60 each    Refill:  11    Return in about 11 months (around 12/23/2023).   I have spent a total time of 30-minutes on the day of the appointment including chart review, data review, collecting history, coordinating care and discussing medical diagnosis and plan with the patient/family. Past medical history, allergies, medications were  reviewed. Pertinent imaging, labs and tests included in this note have been reviewed and interpreted independently by me.  Kinnie Kaupp Mechele Collin, MD [facility] Office Number [phone]

## 2023-02-01 DIAGNOSIS — H353221 Exudative age-related macular degeneration, left eye, with active choroidal neovascularization: Secondary | ICD-10-CM | POA: Diagnosis not present

## 2023-02-27 ENCOUNTER — Other Ambulatory Visit: Payer: Self-pay | Admitting: Thoracic Surgery (Cardiothoracic Vascular Surgery)

## 2023-02-27 DIAGNOSIS — R918 Other nonspecific abnormal finding of lung field: Secondary | ICD-10-CM

## 2023-03-07 NOTE — Progress Notes (Addendum)
COVID Vaccine received:  []  No [x]  Yes Date of any COVID positive Test in last 90 days: none  PCP - Rodrigo Ran, MD [phone] Cardiologist - Marca Ancona, MD Thoracic- Charlett Lango, MD   Pulmonology- Chi Mechele Collin, MD Oncology- Serena Croissant, MD      Chest x-ray -  CT chest wo contrast 01-01-2023  Epic EKG -  05-09-22  Epic Stress Test -  ECHO - Bubble study 09-04-2017 Epic Cardiac Cath -   PCR screen: [x]  Ordered & Completed []   No Order but Needs PROFEND     []   N/A for this surgery  Surgery Plan:  [x]  Ambulatory   []  Outpatient in bed  []  Admit Anesthesia:    [x]  General  []  Spinal  []   Choice []   MAC  Pacemaker / ICD device [x]  No []  Yes   Spinal Cord Stimulator:[x]  No []  Yes       History of Sleep Apnea? [x]  No []  Yes   CPAP used?- [x]  No []  Yes    Does the patient monitor blood sugar?   [x]  N/A   []  No []  Yes  Patient has: [x]  NO Hx DM   []  Pre-DM   []  DM1  []   DM2  Blood Thinner / Instructions:  Plavix  hold x 5 days per Dr. Laurey Morale verbal order.   Patient is aware  Aspirin Instructions:  none  ERAS Protocol Ordered: []  No  [x]  Yes PRE-SURGERY [x]  ENSURE  []  G2   Patient is to be NPO after: 07:00  Dental hx: []  Dentures:  [x]  N/A      []  Bridge or Partial:                   []  Loose or Damaged teeth:   Comments: The patient was given Benzoyl peroxide Gel as ordered. Instruction regarding application starting 2 days prior to surgery was given and patient voiced understanding.   Patient was given the 5 CHG shower / bath instructions for Reverse Shoulder arthroplasty surgery along with 2 bottles of the CHG soap. Patient will start this on:  03-10-2023  All questions were asked and answered, Patient voiced understanding of this process.   Activity level: Patient is able to climb a flight of stairs without difficulty; []  No CP   but would have _SOB  Patient can perform ADLs without assistance.   Anesthesia review: HTN, hx TIA, COPD-severe emphysema, murmur,  lung nodules, GERD, HOH- no HAs, some memory loss,  ? PFO/ murmur - patient denied having either one.   Patient denies shortness of breath, fever, cough and chest pain at PAT appointment.  Patient verbalized understanding and agreement to the Pre-Surgical Instructions that were given to them at this PAT appointment. Patient was also educated of the need to review these PAT instructions again prior to her surgery.I reviewed the appropriate phone numbers to call if they have any and questions or concerns.

## 2023-03-07 NOTE — Patient Instructions (Addendum)
SURGICAL WAITING ROOM VISITATION Patients having surgery or a procedure may have no more than 2 support people in the waiting area - these visitors may rotate in the visitor waiting room.   Due to an increase in RSV and influenza rates and associated hospitalizations, children ages 43 and under may not visit patients in [facility] hospitals. If the patient needs to stay at the hospital during part of their recovery, the visitor guidelines for inpatient rooms apply.  PRE-OP VISITATION  Pre-op nurse will coordinate an appropriate time for 1 support person to accompany the patient in pre-op.  This support person may not rotate.  This visitor will be contacted when the time is appropriate for the visitor to come back in the pre-op area.  Please refer to the [facility] website for the visitor guidelines for Inpatients (after your surgery is over and you are in a regular room).  You are not required to quarantine at this time prior to your surgery. However, you must do this: Hand Hygiene often Do NOT share personal items Notify your provider if you are in close contact with someone who has COVID or you develop fever 100.4 or greater, new onset of sneezing, cough, sore throat, shortness of breath or body aches.  If you test positive for Covid or have been in contact with anyone that has tested positive in the last 10 days please notify you surgeon.    Your procedure is scheduled on:  THURSDAY  March 14, 2023  Report to [facility] Main Entrance: [address] entrance where the [employer] is available.   Report to admitting at: 07:30    AM  Call this number if you have any questions or problems the morning of surgery [phone]  Do not eat food after Midnight the night prior to your surgery/procedure.  After Midnight you may have the following liquids until    07:00 AM  DAY OF SURGERY  Clear Liquid Diet Water Black Coffee (sugar ok, NO MILK/CREAM OR CREAMERS)  Tea (sugar ok, NO  MILK/CREAM OR CREAMERS) regular and decaf                             Plain Jell-O  with no fruit (NO RED)                                           Fruit ices (not with fruit pulp, NO RED)                                     Popsicles (NO RED)                                                                  Juice: NO CITRUS JUICES: only apple, WHITE grape, WHITE cranberry Sports drinks like Gatorade or Powerade (NO RED)                   The day of surgery:  Drink ONE (1) Pre-Surgery Clear Ensure at  07:00  AM the morning of surgery. Drink in one sitting. Do not sip.  This drink was given to you during your hospital pre-op appointment visit. Nothing else to drink after completing the Pre-Surgery Clear Ensure : No candy, chewing gum or throat lozenges.    FOLLOW ANY ADDITIONAL PRE OP INSTRUCTIONS YOU RECEIVED FROM YOUR SURGEON'S OFFICE!!!   Oral Hygiene is also important to reduce your risk of infection.        Remember - BRUSH YOUR TEETH THE MORNING OF SURGERY WITH YOUR REGULAR TOOTHPASTE  Do NOT smoke after Midnight the night before surgery.  STOP TAKING all Vitamins, Herbs and supplements 1 week before your surgery.   Stop taking PLAVIX on:  Saturday 03-09-2023   Take ONLY these medicines the morning of surgery with A SIP OF WATER: Omeprazole, metoprolol, and you may take Tylenol if needed for pain. You may use your Trelegy Ellipta and Albuterol inhaler if needed.                    You may not have any metal on your body including hair pins, jewelry, and body piercing  Do not wear make-up, lotions, powders, perfumes  or deodorant  Do not wear nail polish including gel and S&S, artificial / acrylic nails, or any other type of covering on natural nails including finger and toenails. If you have artificial nails, gel coating, etc., that needs to be removed by a nail salon, Please have this removed prior to surgery. Not doing so may mean that your surgery could be cancelled or  delayed if the Surgeon or anesthesia staff feels like they are unable to monitor you safely.   Do not shave 48 hours prior to surgery to avoid nicks in your skin which may contribute to postoperative infections.   Contacts, Hearing Aids, dentures or bridgework may not be worn into surgery. DENTURES WILL BE REMOVED PRIOR TO SURGERY PLEASE DO NOT APPLY "Poly grip" OR ADHESIVES!!!  You may bring a small overnight bag with you on the day of surgery, only pack items that are not valuable. [facility] IS NOT RESPONSIBLE   FOR VALUABLES THAT ARE LOST OR STOLEN.   Patients discharged on the day of surgery will not be allowed to drive home.  Someone NEEDS to stay with you for the first 24 hours after anesthesia.  Do not bring your home medications to the hospital EXCEPT BRING YOUR ALBUTEROL INHALER. . The Pharmacy will dispense medications listed on your medication list to you during your admission in the Hospital.  Special Instructions: Bring a copy of your healthcare power of attorney and living will documents the day of surgery, if you wish to have them scanned into your [facility]- EPIC  Please read over the following fact sheets you were given: IF YOU HAVE QUESTIONS ABOUT YOUR PRE-OP INSTRUCTIONS, PLEASE CALL [phone].     Pre-operative 5 CHG Bath Instructions   You can play a key role in reducing the risk of infection after surgery. Your skin needs to be as free of germs as possible. You can reduce the number of germs on your skin by washing with CHG (chlorhexidine gluconate) soap before surgery. CHG is an antiseptic soap that kills germs and continues to kill germs even after washing.   DO NOT use if you have an allergy to chlorhexidine/CHG or antibacterial soaps. If your skin becomes reddened or irritated, stop using the CHG and notify one of our RNs at [phone]  Please shower  with the CHG soap starting 4 days before surgery using the following schedule: START SHOWERS  ON   SUNDAY  March 10, 2023                                                                                                                                                                              Please keep in mind the following:  DO NOT shave, including legs and underarms, starting the day of your first shower.   You may shave your face at any point before/day of surgery.   Place clean sheets on your bed the day you start using CHG soap. Use a clean washcloth (not used since being washed) for each shower. DO NOT sleep with pets once you start using the CHG.   CHG Shower Instructions:  If you choose to wash your hair and private area, wash first with your normal shampoo/soap.  After you use shampoo/soap, rinse your hair and body thoroughly to remove shampoo/soap residue.  Turn the water OFF and apply about 3 tablespoons (45 ml) of CHG soap to a CLEAN washcloth.  Apply CHG soap ONLY FROM YOUR NECK DOWN TO YOUR TOES (washing for 3-5 minutes)  DO NOT use CHG soap on face, private areas, open wounds, or sores.  Pay special attention to the area where your surgery is being performed.  If you are having back surgery, having someone wash your back for you may be helpful.  Wait 2 minutes after CHG soap is applied, then you may rinse off the CHG soap.  Pat dry with a clean towel  Put on clean clothes/pajamas   If you choose to wear lotion, please use ONLY the CHG-compatible lotions on the back of this paper.     Additional instructions for the day of surgery: DO NOT APPLY any lotions, deodorants, cologne, or perfumes.   Put on clean/comfortable clothes.  Brush your teeth.  Ask your nurse before applying any prescription medications to the skin.      CHG Compatible Lotions   Aveeno Moisturizing lotion  Cetaphil Moisturizing Cream  Cetaphil Moisturizing Lotion  Clairol Herbal Essence Moisturizing Lotion, Dry Skin  Clairol Herbal Essence Moisturizing Lotion, Extra Dry Skin   Clairol Herbal Essence Moisturizing Lotion, Normal Skin  Curel Age Defying Therapeutic Moisturizing Lotion with Alpha Hydroxy  Curel Extreme Care Body Lotion  Curel Soothing Hands Moisturizing Hand Lotion  Curel Therapeutic Moisturizing Cream, Fragrance-Free  Curel Therapeutic Moisturizing Lotion, Fragrance-Free  Curel Therapeutic Moisturizing Lotion, Original Formula  Eucerin Daily Replenishing Lotion  Eucerin Dry Skin Therapy Plus Alpha Hydroxy Crme  Eucerin Dry Skin Therapy Plus Alpha Hydroxy Lotion  Eucerin Original Crme  Eucerin Original Lotion  Eucerin Plus Crme  Eucerin Plus Lotion  Eucerin TriLipid Replenishing Lotion  Keri Anti-Bacterial Hand Lotion  Keri Deep Conditioning Original Lotion Dry Skin Formula Softly Scented  Keri Deep Conditioning Original Lotion, Fragrance Free Sensitive Skin Formula  Keri Lotion Fast Absorbing Fragrance Free Sensitive Skin Formula  Keri Lotion Fast Absorbing Softly Scented Dry Skin Formula  Keri Original Lotion  Keri Skin Renewal Lotion Keri Silky Smooth Lotion  Keri Silky Smooth Sensitive Skin Lotion  Nivea Body Creamy Conditioning Oil  Nivea Body Extra Enriched Lotion  Nivea Body Original Lotion  Nivea Body Sheer Moisturizing Lotion Nivea Crme  Nivea Skin Firming Lotion  NutraDerm 30 Skin Lotion  NutraDerm Skin Lotion  NutraDerm Therapeutic Skin Cream  NutraDerm Therapeutic Skin Lotion  ProShield Protective Hand Cream  Provon moisturizing lotion    Preparing for Total Shoulder Arthroplasty ================================================================= Please follow these instructions carefully, in addition to any other special Bathing information that was explained to you at the Presurgical Appointment:  BENZOYL PEROXIDE 5% GEL: Used to kill bacteria on the skin which could cause an infection at the surgery site.   Please do not use if you have an allergy to benzoyl peroxide. If your skin becomes reddened/irritated stop using  the benzoyl peroxide and inform your Doctor.   Starting two days before surgery, apply as follows:  1. Apply benzoyl peroxide gel in the morning and at night. Apply after taking a shower. If you are not taking a shower, clean entire shoulder front, back, and side, along with the armpit with a clean wet washcloth.  2. Place a quarter-sized dollop of the gel on your SHOULDER and rub in thoroughly, making sure to cover the front, back, and side of your shoulder, along with the armpit.   2 Days prior to Surgery  TUESDAY   March 12, 2023 First Application _______ Morning Second Application _______ Night  Day Before Surgery   [facility]  March 13, 2023 First Application______ Morning  On the night before surgery, wash your entire body (except hair, face and private areas) with CHG Soap. THEN, rub in the LAST application of the Benzoyl Peroxide Gel on your shoulder.   3. On the Morning of Surgery wash your BODY AGAIN with CHG Soap (except hair, face and private areas)  4. DO NOT USE THE BENZOYL PEROXIDE GEL ON THE DAY OF YOUR SURGERY      FAILURE TO FOLLOW THESE INSTRUCTIONS MAY RESULT IN THE CANCELLATION OF YOUR SURGERY  PATIENT SIGNATURE_________________________________  NURSE SIGNATURE__________________________________  ________________________________________________________________________      Anna York    An incentive spirometer is a tool that can help keep your lungs clear and active. This tool measures how well you are filling your lungs with each breath. Taking long deep breaths may help reverse or decrease the chance of developing breathing (pulmonary) problems (especially infection) following: A long period of time when you are unable to move or be active. BEFORE THE PROCEDURE  If the spirometer includes an indicator to show your best effort, your nurse or respiratory therapist will set it to a desired goal. If possible, sit up straight or lean slightly  forward. Try not to slouch. Hold the incentive spirometer in an upright position. INSTRUCTIONS FOR USE  Sit on the edge of your bed if possible, or sit up as far as you can in bed or on a chair. Hold the incentive spirometer in an upright position. Breathe out normally. Place the mouthpiece in your mouth and seal your lips tightly around it. Breathe in  slowly and as deeply as possible, raising the piston or the ball toward the top of the column. Hold your breath for 3-5 seconds or for as long as possible. Allow the piston or ball to fall to the bottom of the column. Remove the mouthpiece from your mouth and breathe out normally. Rest for a few seconds and repeat Steps 1 through 7 at least 10 times every 1-2 hours when you are awake. Take your time and take a few normal breaths between deep breaths. The spirometer may include an indicator to show your best effort. Use the indicator as a goal to work toward during each repetition. After each set of 10 deep breaths, practice coughing to be sure your lungs are clear. If you have an incision (the cut made at the time of surgery), support your incision when coughing by placing a pillow or rolled up towels firmly against it. Once you are able to get out of bed, walk around indoors and cough well. You may stop using the incentive spirometer when instructed by your caregiver.  RISKS AND COMPLICATIONS Take your time so you do not get dizzy or light-headed. If you are in pain, you may need to take or ask for pain medication before doing incentive spirometry. It is harder to take a deep breath if you are having pain. AFTER USE Rest and breathe slowly and easily. It can be helpful to keep track of a log of your progress. Your caregiver can provide you with a simple table to help with this. If you are using the spirometer at home, follow these instructions: SEEK MEDICAL CARE IF:  You are having difficultly using the spirometer. You have trouble using the  spirometer as often as instructed. Your pain medication is not giving enough relief while using the spirometer. You develop fever of 100.5 F (38.1 C) or higher.                                                                                                    SEEK IMMEDIATE MEDICAL CARE IF:  You cough up bloody sputum that had not been present before. You develop fever of 102 F (38.9 C) or greater. You develop worsening pain at or near the incision site. MAKE SURE YOU:  Understand these instructions. Will watch your condition. Will get help right away if you are not doing well or get worse. Document Released: 06/18/2006 Document Revised: 04/30/2011 Document Reviewed: 08/19/2006 [facility] Patient Information 2014 [place], [place].

## 2023-03-08 ENCOUNTER — Other Ambulatory Visit: Payer: Self-pay

## 2023-03-08 ENCOUNTER — Encounter (HOSPITAL_COMMUNITY)
Admission: RE | Admit: 2023-03-08 | Discharge: 2023-03-08 | Disposition: A | Discharge status: Home / Self Care | Payer: PPO | Source: Ambulatory Visit | Attending: Orthopedic Surgery | Admitting: Orthopedic Surgery

## 2023-03-08 ENCOUNTER — Encounter (HOSPITAL_COMMUNITY): Payer: Self-pay

## 2023-03-08 VITALS — BP 160/78 | Temp 98.2°F | Resp 14 | Ht 64.0 in | Wt 110.0 lb

## 2023-03-08 DIAGNOSIS — Z87891 Personal history of nicotine dependence: Secondary | ICD-10-CM | POA: Insufficient documentation

## 2023-03-08 DIAGNOSIS — J449 Chronic obstructive pulmonary disease, unspecified: Secondary | ICD-10-CM | POA: Diagnosis not present

## 2023-03-08 DIAGNOSIS — Z8673 Personal history of transient ischemic attack (TIA), and cerebral infarction without residual deficits: Secondary | ICD-10-CM | POA: Diagnosis not present

## 2023-03-08 DIAGNOSIS — M75101 Unspecified rotator cuff tear or rupture of right shoulder, not specified as traumatic: Secondary | ICD-10-CM | POA: Diagnosis not present

## 2023-03-08 DIAGNOSIS — I10 Essential (primary) hypertension: Secondary | ICD-10-CM | POA: Insufficient documentation

## 2023-03-08 DIAGNOSIS — Z01818 Encounter for other preprocedural examination: Secondary | ICD-10-CM

## 2023-03-08 DIAGNOSIS — Z01812 Encounter for preprocedural laboratory examination: Secondary | ICD-10-CM | POA: Diagnosis not present

## 2023-03-08 HISTORY — DX: Other amnesia: R41.3

## 2023-03-08 HISTORY — DX: Cardiac murmur, unspecified: R01.1

## 2023-03-08 HISTORY — DX: Essential (primary) hypertension: I10

## 2023-03-08 LAB — CBC
HCT: 42.9 % (ref 36.0–46.0)
Hemoglobin: 13.6 g/dL (ref 12.0–15.0)
MCH: 31.5 pg (ref 26.0–34.0)
MCHC: 31.7 g/dL (ref 30.0–36.0)
MCV: 99.3 fL (ref 80.0–100.0)
Platelets: 295 10*3/uL (ref 150–400)
RBC: 4.32 MIL/uL (ref 3.87–5.11)
RDW: 13.9 % (ref 11.5–15.5)
WBC: 7.6 10*3/uL (ref 4.0–10.5)
nRBC: 0 % (ref 0.0–0.2)

## 2023-03-08 LAB — BASIC METABOLIC PANEL
Anion gap: 9 (ref 5–15)
BUN: 19 mg/dL (ref 8–23)
CO2: 24 mmol/L (ref 22–32)
Calcium: 9.3 mg/dL (ref 8.9–10.3)
Chloride: 102 mmol/L (ref 98–111)
Creatinine, Ser: 0.58 mg/dL (ref 0.44–1.00)
GFR, Estimated: >60 mL/min (ref >60)
Glucose, Bld: 105 mg/dL — ABNORMAL HIGH (ref 70–99)
Potassium: 4.1 mmol/L (ref 3.5–5.1)
Sodium: 135 mmol/L (ref 135–145)

## 2023-03-08 LAB — SURGICAL PCR SCREEN
MRSA, PCR: NEGATIVE
Staphylococcus aureus: NEGATIVE

## 2023-03-11 NOTE — Progress Notes (Signed)
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 former smoker with h/o HTN, COPD, Stroke, right shoulder oa scheduled for above procedure 03/14/2023 with Dr. Francena Hanly.    Pt follows with pulmonology for COPD.  She was last seen 01/22/2023. Per OV note pt has some shortness of breath with activity, no limitations in activity, no exacerbations in the last year. 1 year follow up recommended.   Clearance received from PCP which states pt is low risk, cleared from medical and cardiac standpoint.   VS: BP (!) 160/78 Comment: left arm sitting  Temp 36.8 C (Oral)   Resp 14   Ht 5\' 4"  (1.626 m)   Wt 49.9 kg   SpO2 97%   BMI 18.88 kg/m   PROVIDERS: Rodrigo Ran, MD is PCP    LABS: Labs reviewed: Acceptable for surgery. (all labs ordered are listed, but only abnormal results are displayed)  Labs Reviewed  BASIC METABOLIC PANEL - Abnormal; Notable for the following components:      Result Value   Glucose, Bld 105 (*)    All other components within normal limits  SURGICAL PCR SCREEN  CBC     IMAGES:   EKG:   CV:  Past Medical History:  Diagnosis Date   ASCUS (atypical squamous cells of undetermined significance) on Pap smear    Neg high risk HPV   Asthma    Atrophic vaginitis    Backache, unspecified    Breast cancer (HCC) 1992   Chemo and Radiation   Constipation    COPD (chronic obstructive pulmonary disease) (HCC)    Cystitis    hx of frequent cystitis per pt at preop on 05/09/22.   Cystitis, unspecified    Diverticulosis    Dysphagia    Dyspnea    with exertion   GERD (gastroesophageal reflux disease)    indigestion   Heart murmur    patient denies but it is noted in several visit notes    Hypertension    Intermediate stage nonexudative age-related macular degeneration of left eye    Lymphocytic colitis    Memory loss    Mild   Osteoarthritis    Osteopenia 2012   T score -2.1   Osteoporosis    Palpitations    Personal history of colonic polyps 07/07/2002   hyperplastic   Personal history of malignant neoplasm of breast    Pneumonia    Stroke (HCC) 2010   2 TIA's    TIA (transient ischemic attack)    Unspecified transient cerebral ischemia     Past Surgical History:  Procedure Laterality Date   BREAST BIOPSY Left    biopsy   BREAST LUMPECTOMY Right    chemo and radiation   BREAST LUMPECTOMY WITH RADIOACTIVE SEED LOCALIZATION Right 08/09/2016   Procedure: RIGHT BREAST LUMPECTOMY WITH RADIOACTIVE SEED LOCALIZATION;  Surgeon: Ovidio Kin, MD;  Location: [facility] OR;  Service: General;  Laterality: Right;   CARDIAC CATHETERIZATION     CATARACT EXTRACTION  2014   COLONOSCOPY  2011   diverticulosis   COLPOSCOPY     EYE SURGERY Bilateral    cataract extraction   FACIAL COSMETIC SURGERY     under both eyes  LYMPHADENECTOMY  26   TONSILLECTOMY     age 67   TOTAL KNEE ARTHROPLASTY Right 05/22/2022   Procedure: TOTAL KNEE ARTHROPLASTY;  Surgeon: Durene Romans, MD;  Location: [facility] ORS;  Service: Orthopedics;  Laterality: Right;    MEDICATIONS:  acetaminophen (TYLENOL) 500 MG tablet   albuterol (VENTOLIN HFA) 108 (90 Base) MCG/ACT inhaler   clopidogrel (PLAVIX) 75 MG tablet   Fluticasone-Umeclidin-Vilant (TRELEGY ELLIPTA) 100-62.5-25 MCG/ACT AEPB   ibandronate (BONIVA) 150 MG tablet   metoprolol succinate (TOPROL-XL) 25 MG 24 hr tablet   nystatin (MYCOSTATIN) 100000 UNIT/ML suspension   omeprazole (PRILOSEC) 20 MG capsule   polyethylene glycol (MIRALAX / GLYCOLAX) 17 g packet   rosuvastatin (CRESTOR) 5 MG tablet   No current facility-administered medications for this encounter.    Jodell Cipro Ward, PA-C [facility] Pre-Surgical Testing [phone]

## 2023-03-13 NOTE — Anesthesia Preprocedure Evaluation (Addendum)
Anesthesia Evaluation  Patient identified by MRN, date of birth, ID band Patient awake    Reviewed: Allergy & Precautions, NPO status , Patient's Chart, lab work & pertinent test results  Airway Mallampati: II  TM Distance: >3 FB Neck ROM: Full    Dental no notable dental hx. (+) Implants, Dental Advisory Given, Teeth Intact   Pulmonary asthma , COPD,  COPD inhaler, former smoker   Pulmonary exam normal breath sounds clear to auscultation       Cardiovascular hypertension, Normal cardiovascular exam+ Valvular Problems/Murmurs (mild) AI and MR  Rhythm:Regular Rate:Normal     Neuro/Psych  Headaches TIA (on plavix)   GI/Hepatic   Endo/Other    Renal/GU Lab Results      Component                Value               Date                           K                        4.1                 03/08/2023                         CREATININE               0.58                03/08/2023                    Musculoskeletal  (+) Arthritis , Osteoarthritis,    Abdominal   Peds  Hematology Lab Results      Component                Value               Date                      WBC                      7.6                 03/08/2023                HGB                      13.6                03/08/2023                HCT                      42.9                03/08/2023                MCV                      99.3                03/08/2023                PLT  295                 03/08/2023              Anesthesia Other Findings All: emycin macrobid  Breast CA  Reproductive/Obstetrics                             Anesthesia Physical Anesthesia Plan  ASA: 3  Anesthesia Plan: General   Post-op Pain Management: Regional block* and Minimal or no pain anticipated   Induction: Intravenous  PONV Risk Score and Plan: Treatment may vary due to age or medical condition and  Ondansetron  Airway Management Planned: Oral ETT  Additional Equipment: None  Intra-op Plan:   Post-operative Plan: Extubation in OR  Informed Consent: I have reviewed the patients History and Physical, chart, labs and discussed the procedure including the risks, benefits and alternatives for the proposed anesthesia with the patient or authorized representative who has indicated his/her understanding and acceptance.     Dental advisory given  Plan Discussed with: CRNA and Anesthesiologist  Anesthesia Plan Comments: (GA w R ISB 10/10)        Anesthesia Quick Evaluation

## 2023-03-14 ENCOUNTER — Ambulatory Visit (HOSPITAL_COMMUNITY): Payer: Self-pay | Admitting: Physician Assistant

## 2023-03-14 ENCOUNTER — Ambulatory Visit (HOSPITAL_BASED_OUTPATIENT_CLINIC_OR_DEPARTMENT_OTHER): Payer: PPO | Admitting: Anesthesiology

## 2023-03-14 ENCOUNTER — Ambulatory Visit (HOSPITAL_COMMUNITY)
Admission: RE | Admit: 2023-03-14 | Discharge: 2023-03-14 | Disposition: A | Discharge status: Home / Self Care | Payer: PPO | Attending: Orthopedic Surgery | Admitting: Orthopedic Surgery

## 2023-03-14 ENCOUNTER — Encounter (HOSPITAL_COMMUNITY): Payer: Self-pay | Admitting: Orthopedic Surgery

## 2023-03-14 ENCOUNTER — Encounter (HOSPITAL_COMMUNITY)
Admission: RE | Disposition: A | Discharge status: Home / Self Care | Payer: Self-pay | Source: Home / Self Care | Attending: Orthopedic Surgery

## 2023-03-14 ENCOUNTER — Other Ambulatory Visit: Payer: Self-pay

## 2023-03-14 DIAGNOSIS — Z9221 Personal history of antineoplastic chemotherapy: Secondary | ICD-10-CM | POA: Insufficient documentation

## 2023-03-14 DIAGNOSIS — Z923 Personal history of irradiation: Secondary | ICD-10-CM | POA: Insufficient documentation

## 2023-03-14 DIAGNOSIS — J4489 Other specified chronic obstructive pulmonary disease: Secondary | ICD-10-CM | POA: Diagnosis not present

## 2023-03-14 DIAGNOSIS — Z853 Personal history of malignant neoplasm of breast: Secondary | ICD-10-CM | POA: Diagnosis not present

## 2023-03-14 DIAGNOSIS — J449 Chronic obstructive pulmonary disease, unspecified: Secondary | ICD-10-CM | POA: Diagnosis not present

## 2023-03-14 DIAGNOSIS — I10 Essential (primary) hypertension: Secondary | ICD-10-CM | POA: Diagnosis not present

## 2023-03-14 DIAGNOSIS — K219 Gastro-esophageal reflux disease without esophagitis: Secondary | ICD-10-CM | POA: Insufficient documentation

## 2023-03-14 DIAGNOSIS — G8918 Other acute postprocedural pain: Secondary | ICD-10-CM | POA: Diagnosis not present

## 2023-03-14 DIAGNOSIS — M12811 Other specific arthropathies, not elsewhere classified, right shoulder: Secondary | ICD-10-CM | POA: Diagnosis not present

## 2023-03-14 DIAGNOSIS — M75101 Unspecified rotator cuff tear or rupture of right shoulder, not specified as traumatic: Secondary | ICD-10-CM | POA: Diagnosis not present

## 2023-03-14 DIAGNOSIS — Z87891 Personal history of nicotine dependence: Secondary | ICD-10-CM | POA: Diagnosis not present

## 2023-03-14 DIAGNOSIS — M19011 Primary osteoarthritis, right shoulder: Secondary | ICD-10-CM | POA: Diagnosis not present

## 2023-03-14 HISTORY — PX: REVERSE SHOULDER ARTHROPLASTY: SHX5054

## 2023-03-14 SURGERY — ARTHROPLASTY, SHOULDER, TOTAL, REVERSE
Anesthesia: General | Site: Shoulder | Laterality: Right

## 2023-03-14 MED ORDER — PHENYLEPHRINE HCL-NACL 20-0.9 MG/250ML-% IV SOLN
INTRAVENOUS | Status: DC | PRN
  Administered 2023-03-14: 30 ug/min via INTRAVENOUS

## 2023-03-14 MED ORDER — ONDANSETRON HCL 4 MG/2ML IJ SOLN
INTRAMUSCULAR | Status: AC
  Filled 2023-03-14: qty 2

## 2023-03-14 MED ORDER — ACETAMINOPHEN 10 MG/ML IV SOLN
650.0000 mg | Freq: Once | INTRAVENOUS | Status: DC | PRN
  Administered 2023-03-14: 650 mg via INTRAVENOUS

## 2023-03-14 MED ORDER — EPHEDRINE SULFATE (PRESSORS) 50 MG/ML IJ SOLN
INTRAMUSCULAR | Status: DC | PRN
  Administered 2023-03-14: 7.5 mg via INTRAVENOUS

## 2023-03-14 MED ORDER — FENTANYL CITRATE (PF) 100 MCG/2ML IJ SOLN
INTRAMUSCULAR | Status: DC | PRN
  Administered 2023-03-14: 75 ug via INTRAVENOUS
  Administered 2023-03-14: 25 ug via INTRAVENOUS

## 2023-03-14 MED ORDER — CHLORHEXIDINE GLUCONATE 0.12 % MT SOLN
15.0000 mL | Freq: Once | OROMUCOSAL | Status: AC
  Administered 2023-03-14: 15 mL via OROMUCOSAL

## 2023-03-14 MED ORDER — CYCLOBENZAPRINE HCL 10 MG PO TABS
10.0000 mg | ORAL_TABLET | Freq: Three times a day (TID) | ORAL | 1 refills | Status: DC | PRN
Start: 2023-03-14 — End: 2023-09-27

## 2023-03-14 MED ORDER — BUPIVACAINE HCL (PF) 0.5 % IJ SOLN
INTRAMUSCULAR | Status: DC | PRN
  Administered 2023-03-14: 10 mL via PERINEURAL

## 2023-03-14 MED ORDER — FENTANYL CITRATE PF 50 MCG/ML IJ SOSY
25.0000 ug | PREFILLED_SYRINGE | INTRAMUSCULAR | Status: DC | PRN
  Administered 2023-03-14: 50 ug via INTRAVENOUS

## 2023-03-14 MED ORDER — DEXAMETHASONE SODIUM PHOSPHATE 10 MG/ML IJ SOLN
INTRAMUSCULAR | Status: DC | PRN
  Administered 2023-03-14: 5 mg via INTRAVENOUS

## 2023-03-14 MED ORDER — ROCURONIUM BROMIDE 10 MG/ML (PF) SYRINGE
PREFILLED_SYRINGE | INTRAVENOUS | Status: AC
Start: 2023-03-14 — End: ?
  Filled 2023-03-14: qty 10

## 2023-03-14 MED ORDER — BUPIVACAINE LIPOSOME 1.3 % IJ SUSP
INTRAMUSCULAR | Status: DC | PRN
  Administered 2023-03-14: 10 mL via PERINEURAL

## 2023-03-14 MED ORDER — ROCURONIUM BROMIDE 100 MG/10ML IV SOLN
INTRAVENOUS | Status: DC | PRN
  Administered 2023-03-14: 35 mg via INTRAVENOUS

## 2023-03-14 MED ORDER — 0.9 % SODIUM CHLORIDE (POUR BTL) OPTIME
TOPICAL | Status: DC | PRN
  Administered 2023-03-14: 1000 mL

## 2023-03-14 MED ORDER — LIDOCAINE HCL (CARDIAC) PF 100 MG/5ML IV SOSY
PREFILLED_SYRINGE | INTRAVENOUS | Status: DC | PRN
  Administered 2023-03-14: 60 mg via INTRAVENOUS

## 2023-03-14 MED ORDER — FENTANYL CITRATE PF 50 MCG/ML IJ SOSY
PREFILLED_SYRINGE | INTRAMUSCULAR | Status: AC
  Filled 2023-03-14: qty 1

## 2023-03-14 MED ORDER — MIDAZOLAM HCL 2 MG/2ML IJ SOLN
1.0000 mg | INTRAMUSCULAR | Status: DC
  Filled 2023-03-14: qty 2

## 2023-03-14 MED ORDER — ACETAMINOPHEN 10 MG/ML IV SOLN
INTRAVENOUS | Status: AC
  Filled 2023-03-14: qty 100

## 2023-03-14 MED ORDER — ORAL CARE MOUTH RINSE
15.0000 mL | Freq: Once | OROMUCOSAL | Status: AC
Start: 2023-03-14 — End: 2023-03-14

## 2023-03-14 MED ORDER — ONDANSETRON HCL 4 MG/2ML IJ SOLN
INTRAMUSCULAR | Status: AC
Start: 2023-03-14 — End: ?
  Filled 2023-03-14: qty 2

## 2023-03-14 MED ORDER — LACTATED RINGERS IV SOLN: INTRAVENOUS | Status: DC

## 2023-03-14 MED ORDER — PROPOFOL 10 MG/ML IV BOLUS
INTRAVENOUS | Status: AC
  Filled 2023-03-14: qty 20

## 2023-03-14 MED ORDER — ONDANSETRON HCL 4 MG/2ML IJ SOLN
INTRAMUSCULAR | Status: DC | PRN
  Administered 2023-03-14: 4 mg via INTRAVENOUS

## 2023-03-14 MED ORDER — FENTANYL CITRATE (PF) 100 MCG/2ML IJ SOLN
INTRAMUSCULAR | Status: AC
  Filled 2023-03-14: qty 2

## 2023-03-14 MED ORDER — TRANEXAMIC ACID-NACL 1000-0.7 MG/100ML-% IV SOLN
1000.0000 mg | INTRAVENOUS | Status: AC
  Administered 2023-03-14: 1000 mg via INTRAVENOUS
  Filled 2023-03-14: qty 100

## 2023-03-14 MED ORDER — DEXAMETHASONE SODIUM PHOSPHATE 10 MG/ML IJ SOLN
INTRAMUSCULAR | Status: AC
  Filled 2023-03-14: qty 1

## 2023-03-14 MED ORDER — OXYCODONE-ACETAMINOPHEN 5-325 MG PO TABS: 1.0000 | ORAL_TABLET | ORAL | 0 refills | Status: DC | PRN

## 2023-03-14 MED ORDER — FENTANYL CITRATE PF 50 MCG/ML IJ SOSY
50.0000 ug | PREFILLED_SYRINGE | INTRAMUSCULAR | Status: DC
  Administered 2023-03-14: 25 ug via INTRAVENOUS
  Filled 2023-03-14: qty 2

## 2023-03-14 MED ORDER — CEFAZOLIN SODIUM-DEXTROSE 2-4 GM/100ML-% IV SOLN
2.0000 g | INTRAVENOUS | Status: AC
Start: 2023-03-14 — End: 2023-03-14
  Administered 2023-03-14: 2 g via INTRAVENOUS
  Filled 2023-03-14: qty 100

## 2023-03-14 MED ORDER — VANCOMYCIN HCL 1000 MG IV SOLR
INTRAVENOUS | Status: DC | PRN
  Administered 2023-03-14: 1000 mg

## 2023-03-14 MED ORDER — ONDANSETRON HCL 4 MG/2ML IJ SOLN
4.0000 mg | Freq: Once | INTRAMUSCULAR | Status: AC | PRN
Start: 2023-03-14 — End: 2023-03-14
  Administered 2023-03-14: 4 mg via INTRAVENOUS

## 2023-03-14 MED ORDER — STERILE WATER FOR IRRIGATION IR SOLN
Status: DC | PRN
  Administered 2023-03-14: 1000 mL

## 2023-03-14 MED ORDER — ONDANSETRON HCL 4 MG PO TABS: 4.0000 mg | ORAL_TABLET | Freq: Three times a day (TID) | ORAL | 0 refills | Status: DC | PRN

## 2023-03-14 MED ORDER — PROPOFOL 10 MG/ML IV BOLUS
INTRAVENOUS | Status: DC | PRN
  Administered 2023-03-14: 50 ug/kg/min via INTRAVENOUS
  Administered 2023-03-14: 90 mg via INTRAVENOUS

## 2023-03-14 MED ORDER — TRANEXAMIC ACID 1000 MG/10ML IV SOLN: 1000.0000 mg | INTRAVENOUS | Status: DC

## 2023-03-14 MED ORDER — PROPOFOL 10 MG/ML IV BOLUS: INTRAVENOUS | Status: DC | PRN

## 2023-03-14 MED ORDER — PHENYLEPHRINE HCL (PRESSORS) 10 MG/ML IV SOLN
INTRAVENOUS | Status: DC | PRN
  Administered 2023-03-14: 120 ug via INTRAVENOUS

## 2023-03-14 MED ORDER — SUGAMMADEX SODIUM 200 MG/2ML IV SOLN
INTRAVENOUS | Status: DC | PRN
  Administered 2023-03-14: 200 mg via INTRAVENOUS

## 2023-03-14 SURGICAL SUPPLY — 64 items
BAG COUNTER SPONGE SURGICOUNT (BAG) IMPLANT
BAG ZIPLOCK 12X15 (MISCELLANEOUS) ×1 IMPLANT
BIT DRILL AR 3 NS (BIT) IMPLANT
BLADE SAW SGTL 83.5X18.5 (BLADE) ×1 IMPLANT
BNDG COHESIVE 4X5 TAN STRL LF (GAUZE/BANDAGES/DRESSINGS) ×1 IMPLANT
CLSR STERI-STRIP ANTIMIC 1/2X4 (GAUZE/BANDAGES/DRESSINGS) IMPLANT
COOLER ICEMAN CLASSIC (MISCELLANEOUS) ×1 IMPLANT
COVER BACK TABLE 60X90IN (DRAPES) ×1 IMPLANT
COVER SURGICAL LIGHT HANDLE (MISCELLANEOUS) ×1 IMPLANT
CUP SUT UNIV REVERS 36 NEUTRAL (Cup) IMPLANT
DERMABOND ADVANCED .7 DNX12 (GAUZE/BANDAGES/DRESSINGS) IMPLANT
DRAPE SHEET LG 3/4 BI-LAMINATE (DRAPES) ×1 IMPLANT
DRAPE SURG 17X11 SM STRL (DRAPES) ×1 IMPLANT
DRAPE SURG ORHT 6 SPLT 77X108 (DRAPES) ×2 IMPLANT
DRAPE TOP 10253 STERILE (DRAPES) ×1 IMPLANT
DRAPE U-SHAPE 47X51 STRL (DRAPES) ×1 IMPLANT
DRESSING AQUACEL AG SP 3.5X6 (GAUZE/BANDAGES/DRESSINGS) ×1 IMPLANT
DRSG AQUACEL AG ADV 3.5X 6 (GAUZE/BANDAGES/DRESSINGS) IMPLANT
DRSG AQUACEL AG ADV 3.5X10 (GAUZE/BANDAGES/DRESSINGS) IMPLANT
DRSG AQUACEL AG SP 3.5X6 (GAUZE/BANDAGES/DRESSINGS) ×1
DURAPREP 26ML APPLICATOR (WOUND CARE) ×1 IMPLANT
ELECT BLADE TIP CTD 4 INCH (ELECTRODE) ×1 IMPLANT
ELECT PENCIL ROCKER SW 15FT (MISCELLANEOUS) ×1 IMPLANT
ELECT REM PT RETURN 15FT ADLT (MISCELLANEOUS) ×1 IMPLANT
FACESHIELD WRAPAROUND (MASK) ×5
FACESHIELD WRAPAROUND OR TEAM (MASK) ×5 IMPLANT
GLENOID UNI REV MOD 24 +2 LAT (Joint) IMPLANT
GLENOSPHERE 36 +4 LAT/24 (Joint) IMPLANT
GLOVE BIO SURGEON STRL SZ7.5 (GLOVE) ×1 IMPLANT
GLOVE BIO SURGEON STRL SZ8 (GLOVE) ×1 IMPLANT
GLOVE SS BIOGEL STRL SZ 7 (GLOVE) ×1 IMPLANT
GLOVE SS BIOGEL STRL SZ 7.5 (GLOVE) ×1 IMPLANT
GOWN STRL SURGICAL XL XLNG (GOWN DISPOSABLE) ×2 IMPLANT
INSERT HUMERAL UNI REVERS 36 3 (Insert) IMPLANT
KIT BASIN OR (CUSTOM PROCEDURE TRAY) ×1 IMPLANT
KIT TURNOVER KIT A (KITS) IMPLANT
MANIFOLD NEPTUNE II (INSTRUMENTS) ×1 IMPLANT
NDL TAPERED W/ NITINOL LOOP (MISCELLANEOUS) ×1 IMPLANT
NEEDLE TAPERED W/ NITINOL LOOP (MISCELLANEOUS) ×1
NS IRRIG 1000ML POUR BTL (IV SOLUTION) ×1 IMPLANT
PACK SHOULDER (CUSTOM PROCEDURE TRAY) ×1 IMPLANT
PAD ARMBOARD 7.5X6 YLW CONV (MISCELLANEOUS) ×1 IMPLANT
PAD COLD SHLDR WRAP-ON (PAD) ×1 IMPLANT
PIN NITINOL TARGETER 2.8 (PIN) IMPLANT
PIN SET MODULAR GLENOID SYSTEM (PIN) IMPLANT
RESTRAINT HEAD UNIVERSAL NS (MISCELLANEOUS) ×1 IMPLANT
SCREW CENTRAL MODULAR 25 (Screw) IMPLANT
SCREW PERI LOCK 5.5X16 (Screw) IMPLANT
SCREW PERIPHERAL 5.5X20 LOCK (Screw) IMPLANT
SCREW PERIPHERAL 5.5X28 LOCK (Screw) IMPLANT
SLING ARM FOAM STRAP LRG (SOFTGOODS) IMPLANT
SLING ARM FOAM STRAP MED (SOFTGOODS) IMPLANT
STEM HUMERAL UNI REVERS SZ9 (Stem) IMPLANT
STRIP CLOSURE SKIN 1/2X4 (GAUZE/BANDAGES/DRESSINGS) ×1 IMPLANT
SUT MNCRL AB 3-0 PS2 18 (SUTURE) ×1 IMPLANT
SUT MON AB 2-0 CT1 36 (SUTURE) ×1 IMPLANT
SUT VIC AB 1 CT1 36 (SUTURE) ×1 IMPLANT
SUTURE TAPE 1.3 40 TPR END (SUTURE) ×2 IMPLANT
SUTURETAPE 1.3 40 TPR END (SUTURE) ×2
TOWEL GREEN STERILE FF (TOWEL DISPOSABLE) ×1 IMPLANT
TOWEL OR 17X26 10 PK STRL BLUE (TOWEL DISPOSABLE) ×1 IMPLANT
TUBE SUCTION HIGH CAP CLEAR NV (SUCTIONS) ×1 IMPLANT
TUBING CONNECTING 10 (TUBING) ×1 IMPLANT
WATER STERILE IRR 1000ML POUR (IV SOLUTION) ×2 IMPLANT

## 2023-03-14 NOTE — Anesthesia Postprocedure Evaluation (Signed)
Anesthesia Post Note  Patient: Anna York  Procedure(s) Performed: REVERSE SHOULDER ARTHROPLASTY (Right: Shoulder)     Patient location during evaluation: PACU Anesthesia Type: General and Regional Level of consciousness: awake and alert Pain management: pain level controlled Vital Signs Assessment: post-procedure vital signs reviewed and stable Respiratory status: spontaneous breathing, nonlabored ventilation, respiratory function stable and patient connected to nasal cannula oxygen Cardiovascular status: blood pressure returned to baseline and stable Postop Assessment: no apparent nausea or vomiting Anesthetic complications: no   No notable events documented.  Last Vitals:  Vitals:   03/14/23 1245 03/14/23 1300  BP: (!) 140/82 (!) 150/90  Pulse: 79 75  Resp: (!) 21 19  Temp:    SpO2: 91% 90%    Last Pain:  Vitals:   03/14/23 1300  TempSrc:   PainSc: 9                  Trevor Iha

## 2023-03-14 NOTE — Op Note (Signed)
03/14/2023  11:28 AM  PATIENT:   Anna York  83 y.o. female  PRE-OPERATIVE DIAGNOSIS:  Right Shoulder Rotator Cuff Tear arthropathy  POST-OPERATIVE DIAGNOSIS: Same  PROCEDURE: Right shoulder reverse arthroplasty lysing a press-fit size 9 Arthrex stem with a neutral metathesis, +3 constrained polyethylene insert, 36/+4 glenosphere and a small/+2 baseplate  SURGEON:  Terrah Decoster, Vania Rea M.D.  ASSISTANTS: Ralene Bathe, PA-C  Ralene Bathe, PA-C was utilized as an Geophysicist/field seismologist throughout this case, essential for help with positioning the patient, positioning extremity, tissue manipulation, implantation of the prosthesis, suture management, wound closure, and intraoperative decision-making.  ANESTHESIA:   General Endotracheal and interscalene block with Exparel  EBL: 100 cc  SPECIMEN: None  Drains: None   PATIENT DISPOSITION:  PACU - hemodynamically stable.    PLAN OF CARE: Discharge to home after PACU  Brief history:  Patient is an 83 year old female well-known to our practice with chronic and progressively increasing right shoulder pain related to severe osteoarthritis and rotator cuff dysfunction and degeneration.  Due to her increasing pain and failure to respond to prolonged attempts at conservative management, she is brought to the operating this time for planned right shoulder reverse arthroplasty.  Preoperatively, I counseled the patient regarding treatment options and risks versus benefits thereof.  Possible surgical complications were all reviewed including potential for bleeding, infection, neurovascular injury, persistent pain, loss of motion, anesthetic complication, failure of the implant, and possible need for additional surgery. They understand and accept and agrees with our planned procedure.   Procedure detail:  After undergoing routine preop evaluation the patient received prophylactic antibiotics and interscalene block with Exparel established in the  holding area by the anesthesia department.  Subsequently placed spine on the operating table and underwent the smooth induction of a general endotracheal anesthesia.  Placed into the beachchair position and appropriately padded and protected.  The right shoulder girdle region was sterilely prepped and draped in standard fashion.  Timeout was called.  A deltopectoral approach to the right shoulder was made through an approximately 7 cm incision.  Skin flaps elevated dissection carried deeply deltopectoral interval was then developed from proximal to distal with the vein taken laterally.  There were numerous varicosities and tortuous anomalies with the cephalic vein and ultimately this was ligated.  The conjoined tendon was then mobilized and retracted medially.  Long head biceps tendon was then tenodesed at the upper border the pectoralis major tendon with the proximal segment unroofed and excised.  The rotator cuff was then split from the apex of the bicipital groove to the base of the coracoid and the subscap was then separated from the lesser tuberosity with electrocautery and the free margin was tagged with a pair of grasping suture tape sutures.  Capsular attachments were then divided from the anterior and inferior margins of the humeral neck with careful dissection and consideration of the very large osteophyte at the margins of the humeral head and ultimately humeral head was delivered through the wound.  An extra medullary guide was used to outline the proposed humeral head resection which we performed with an oscillating saw at approximately 20 degrees of retroversion.  A marginal osteophyte was removed with a rondure and a metal cap was then placed over the cut proximal humeral surface.  The glenoid was then exposed and a circumferential labral resection was performed in addition to extensive synovectomy with removal of multiple chondral loose bodies from the peritracheal region.  A guidepin was then  directed into the center of  the glenoid and the glenoid was then reamed with the central followed by the peripheral reamer to a stable subchondral bony bed.  Preparation completed with a drill and tapped for a 25 mm lag screw.  Our baseplate was then assembled and inserted with vancomycin powder applied on the threads of the lag screw with excellent fixation achieved.  All the peripheral locking screws were then placed using standard technique with excellent fixation.  A 36/+4 glenosphere was then impacted after we confirmed that proper clearance had been achieved around the margin of the glenoid.  A central locking screw was then placed.  We then returned attention back to the humeral metaphysis where the canal was opened and we broached up to a size 9 and approximately 20 degrees of retroversion.  A neutral metaphyseal reaming guide was then used.  The metaphysis.  Trial implant was placed and trial reduction showed good motion stability and soft tissue balance.  At this point the trial implant was removed.  A final implant was assembled.  The canal was irrigated cleaned and dried and vancomycin powder applied into the canal and the final implant was then seated with excellent fixation.  Trial reductions were then performed and ultimately felt that a +3 constrained poly would give [place] the best motion stability and soft tissue balance.  Trial poly was removed.  The implant was cleaned and dried and the final poly was impacted.  Final reduction showed excellent motion stability and soft tissue balance all much to our satisfaction.  The subscapularis was then mobilized and confirmed to have good elasticity and good contractility and was repaired back to the collar of the implant using the previously placed suture tape sutures.  Wound was copes irrigated.  Hemostasis was obtained.  The deltopectoral interval was reapproximated with a series of figure-of-eight number Vicryl sutures after the balance of the vancomycin  powder was sprayed liberally throughout the deep soft tissue planes.  2-0 Monocryl used to close the subcu layer and intracuticular 3-0 Monocryl for the skin followed by Steri-Strips and Aquacel dressing.  The right arm was placed into a sling.  Patient was then awakened, extubated, and taken to the recovery in stable condition.  Senaida Lange MD   Contact # [phone]

## 2023-03-14 NOTE — Transfer of Care (Signed)
Immediate Anesthesia Transfer of Care Note  Patient: Anna York  Procedure(s) Performed: REVERSE SHOULDER ARTHROPLASTY (Right: Shoulder)  Patient Location: PACU  Anesthesia Type:General  Level of Consciousness: drowsy  Airway & Oxygen Therapy: Patient Spontanous Breathing  Post-op Assessment: Report given to RN  Post vital signs: Reviewed and stable  Last Vitals:  Vitals Value Taken Time  BP 123/67 03/14/23 1148  Temp    Pulse 75 03/14/23 1151  Resp 19 03/14/23 1151  SpO2 99 % 03/14/23 1151  Vitals shown include unfiled device data.  Last Pain:  Vitals:   03/14/23 0935  TempSrc:   PainSc: 0-No pain         Complications: No notable events documented.

## 2023-03-14 NOTE — Discharge Instructions (Signed)

## 2023-03-14 NOTE — Care Plan (Signed)
Ortho Bundle Case Management Note  Patient Details  Name: Anna York MRN: [id number] Date of Birth: [birth year]                  R Rev TSA on 03/14/23.  DCP: Home with husband.  DME: No needs.  PT: EO   DME Arranged:  N/A DME Agency:       Additional Comments: Please contact me with any questions of if this plan should need to change.    Despina Pole, CCM Case Manager, [facility]  [phone] 03/14/2023, 2:39 PM

## 2023-03-14 NOTE — Evaluation (Signed)
Occupational Therapy Evaluation Patient Details Name: Anna York MRN: [id number] DOB: [birth year] Today's Date: 03/14/2023   History of Present Illness 83 yo female s/p R reverse TSA. PMH: R TKA 05/22/2022, R BaCa s/p chemo and radiation, COPD. TIA, LBP and dysphagia.   Clinical Impression   PTA pt lives independently with her husband. Education completed regarding compensatory strategies for ADL tasks and functional mobility, management of sling, RUE ROM per specified parameters in the order set as indicated below, positioning of operative arm in sitting and supine and edema control, including use of "Iceman" Cold Therapy machine. Caregiver present for education, written handouts provided and reviewed using Teach Back and pt/caregiver verbalized/demonstrated understanding. Due to the below listed deficits, pt requires mod assistance with ADL tasks and min assist with functional mobility. Caregiver will be able to provide necessary level of assistance at discharge. Pt to follow up with MD to progress rehab of the operative shoulder.  During ambulation, pt with SOB and desat to 80 on RA with good pleth. Slow rebound to low 90s with pursed lip breathing. Nsg made aware. On second ambulation attempt, O2 sat dropped to @ 83 with rebound to 93. Pt states she has been having increased SOB and "needs to follow up with her doctor".        If plan is discharge home, recommend the following: A little help with walking and/or transfers;A lot of help with bathing/dressing/bathroom;Assistance with cooking/housework;Assist for transportation;Help with stairs or ramp for entrance    Functional Status Assessment  Patient has had a recent decline in their functional status and demonstrates the ability to make significant improvements in function in a reasonable and predictable amount of time.  Equipment Recommendations  None recommended by OT    Recommendations for Other Services       Precautions /  Restrictions Precautions Precautions: Fall;Shoulder; Shoulder FF 0-60; ER 0-20; Abd 0-45. ROM for ADL purposes only; AROM elbow/wrist/hand; OK for lap slides and pendulums; Loosen sling from neck when arm is supported in sitting  Shoulder Interventions: Shoulder sling/immobilizer;At all times;Off for dressing/bathing/exercises Precaution Booklet Issued: No Required Braces or Orthoses: Sling Restrictions Weight Bearing Restrictions Per Provider Order: Yes RUE Weight Bearing Per Provider Order: Non weight bearing      Mobility Bed Mobility               General bed mobility comments: Educated on technique for bed mobility    Transfers Overall transfer level: Needs assistance   Transfers: Sit to/from Stand Sit to Stand: Min assist                  Balance Overall balance assessment: Needs assistance   Sitting balance-Leahy Scale: Good       Standing balance-Leahy Scale: Fair                             ADL either performed or assessed with clinical judgement   ADL Overall ADL's : Needs assistance/impaired     Grooming: Minimal assistance   Upper Body Bathing: Moderate assistance;Sitting   Lower Body Bathing: Moderate assistance;Sit to/from stand   Upper Body Dressing : Moderate assistance;Sitting   Lower Body Dressing: Moderate assistance;Sit to/from stand   Toilet Transfer: Minimal assistance      Per orders, R shoulder parameters as follows for ADL tasks: Abd 0-45; ER 0-20; FF 0-60. While moving within specified parameters, pt/caregiver instructed on bathing and how to donn/doff shirt, placing  operative arm through sleeve first when donning and off last when doffing.Pt/caregiver educated on compensatory strategies for LB ADL and strategies to reduce risk of falls.  Pt/caregiver educated on donning/doffing sling and to wear the sling at all times with the exception of ADL, and to loosen the neck strap of the sling when the operative arm is in a  supported position when sitting. In sitting or supine, pt instructed to have a pillow behind and under their operative arm to provide support. If assist needed with ambulation, caregiver educated on the importance of walking on pt's non-operative side.  Education regarding use of "IceMan" Cold Therapy completed, including the importance of using a barrier on the shoulder prior to positioning the wrap-on pad. Pt/caregiver verbalized/demonstrated understanding. Teach Back used while caregiver assisted with dressing pt and positioning "wrap-on pad" to facilitate DC.      Functional mobility during ADLs: Minimal assistance (HHA)       Vision         Perception         Praxis         Pertinent Vitals/Pain Pain Assessment Pain Assessment: No/denies pain     Extremity/Trunk Assessment Upper Extremity Assessment Upper Extremity Assessment: Right hand dominant;RUE deficits/detail RUE Deficits / Details: block active; elbow/wrist/hand AAROM WFL RUE Coordination: decreased gross motor;decreased fine motor   Lower Extremity Assessment Lower Extremity Assessment:  (R TKA)   Cervical / Trunk Assessment Cervical / Trunk Assessment: Normal   Communication Communication Communication: No apparent difficulties   Cognition Arousal: Alert Behavior During Therapy: WFL for tasks assessed/performed Overall Cognitive Status: History of cognitive impairments - at baseline (memory deficits at baseline)                                       General Comments       Exercises Exercises: Shoulder Shoulder Exercises Elbow Flexion: AAROM, Right, 5 reps Elbow Extension: AAROM, Right, 5 reps Wrist Flexion: AAROM, Right, 5 reps Wrist Extension: AAROM, Right, 5 reps Digit Composite Flexion: AAROM, Right, 5 reps Composite Extension: AAROM, Right, 5 reps Neck Flexion: AROM Neck Extension: AROM Neck Lateral Flexion - Right: AROM Neck Lateral Flexion - Left: AROM   Shoulder  Instructions Shoulder Instructions Donning/doffing shirt without moving shoulder: Caregiver independent with task;Patient able to independently direct caregiver Method for sponge bathing under operated UE: Caregiver independent with task;Patient able to independently direct caregiver Donning/doffing sling/immobilizer: Caregiver independent with task;Patient able to independently direct caregiver Correct positioning of sling/immobilizer: Caregiver independent with task;Patient able to independently direct caregiver Pendulum exercises (written home exercise program): Caregiver independent with task;Patient able to independently direct caregiver (dangle) ROM for elbow, wrist and digits of operated UE: Caregiver independent with task;Patient able to independently direct caregiver Sling wearing schedule (on at all times/off for ADL's): Caregiver independent with task;Patient able to independently direct caregiver Proper positioning of operated UE when showering: Caregiver independent with task;Patient able to independently direct caregiver Positioning of UE while sleeping: Patient able to independently direct caregiver;Caregiver independent with task    Home Living Family/patient expects to be discharged to:: Private residence Living Arrangements: Spouse/significant other Available Help at Discharge: Family Type of Home: House Home Access: Stairs to enter Secretary/administrator of Steps: 2 Entrance Stairs-Rails: Right Home Layout: Two level;Able to live on main level with bedroom/bathroom     Bathroom Shower/Tub: Producer, television/film/video: Handicapped height Bathroom  Accessibility: Yes How Accessible: Accessible via wheelchair Home Equipment: Shower seat;Rolling Walker (2 wheels);BSC/3in1;Cane - single point          Prior Functioning/Environment Prior Level of Function : Independent/Modified Independent                        OT Problem List: Decreased  strength;Decreased range of motion;Impaired balance (sitting and/or standing);Decreased safety awareness;Decreased knowledge of precautions;Impaired UE functional use      OT Treatment/Interventions:      OT Goals(Current goals can be found in the care plan section) Acute Rehab OT Goals Patient Stated Goal: to use her arm better OT Goal Formulation: All assessment and education complete, DC therapy  OT Frequency:      Co-evaluation              AM-PAC OT "6 Clicks" Daily Activity     Outcome Measure Help from another person eating meals?: A Little Help from another person taking care of personal grooming?: A Little Help from another person toileting, which includes using toliet, bedpan, or urinal?: A Lot Help from another person bathing (including washing, rinsing, drying)?: A Lot Help from another person to put on and taking off regular upper body clothing?: A Lot Help from another person to put on and taking off regular lower body clothing?: A Lot 6 Click Score: 14   End of Session Equipment Utilized During Treatment: Gait belt Nurse Communication: Other (comment) (Desat wtih mobility)  Activity Tolerance: Patient tolerated treatment well Patient left: in chair;with family/visitor present  OT Visit Diagnosis: Unsteadiness on feet (R26.81);Muscle weakness (generalized) (M62.81)                Time: 4696-2952 OT Time Calculation (min): 52 min Charges:  OT General Charges $OT Visit: 1 Visit OT Evaluation $OT Eval Low Complexity: 1 Low OT Treatments $Self Care/Home Management : 23-37 mins  Luisa Dago, OT/L   Acute OT Clinical Specialist Acute Rehabilitation Services Pager [phone] Office [phone]   [facility] 03/14/2023, 4:43 PM

## 2023-03-14 NOTE — Anesthesia Procedure Notes (Signed)
Procedure Name: Intubation Date/Time: 03/14/2023 9:58 AM  Performed by: Randa Evens, CRNAPre-anesthesia Checklist: Patient identified, Emergency Drugs available, Suction available and Patient being monitored Patient Re-evaluated:Patient Re-evaluated prior to induction Oxygen Delivery Method: Circle System Utilized Preoxygenation: Pre-oxygenation with 100% oxygen Induction Type: IV induction Ventilation: Mask ventilation without difficulty Laryngoscope Size: Mac and 3 Grade View: Grade I Tube type: Oral Tube size: 7.0 mm Number of attempts: 1 Airway Equipment and Method: Stylet and Oral airway Placement Confirmation: ETT inserted through vocal cords under direct vision, positive ETCO2 and breath sounds checked- equal and bilateral Secured at: 21 cm Tube secured with: Tape Dental Injury: Teeth and Oropharynx as per pre-operative assessment

## 2023-03-14 NOTE — H&P (Signed)
Anna York    Chief Complaint: Right Shoulder Rotator Cuff Tear arthropathy HPI: The patient is a 83 y.o. female with chronic and progressively increasing right shoulder pain and profoundly restricted mobility related to severe rotator cuff tear arthropathy with associated bone deformity.  Due to her increasing pain and failure to respond to prolonged attempts of conservative management, she is brought to the operating this time for planned right shoulder reverse arthroplasty.  Past Medical History:  Diagnosis Date   ASCUS (atypical squamous cells of undetermined significance) on Pap smear    Neg high risk HPV   Asthma    Atrophic vaginitis    Backache, unspecified    Breast cancer (HCC) 1992   Chemo and Radiation   Constipation    COPD (chronic obstructive pulmonary disease) (HCC)    Cystitis    hx of frequent cystitis per pt at preop on 05/09/22.   Cystitis, unspecified    Diverticulosis    Dysphagia    Dyspnea    with exertion   GERD (gastroesophageal reflux disease)    indigestion   Heart murmur    patient denies but it is noted in several visit notes   Hypertension    Intermediate stage nonexudative age-related macular degeneration of left eye    Lymphocytic colitis    Memory loss    Mild   Osteoarthritis    Osteopenia 2012   T score -2.1   Osteoporosis    Palpitations    Personal history of colonic polyps 07/07/2002   hyperplastic   Personal history of malignant neoplasm of breast    Pneumonia    Stroke (HCC) 2010   2 TIA's    TIA (transient ischemic attack)    Unspecified transient cerebral ischemia       Past Surgical History:  Procedure Laterality Date   BREAST BIOPSY Left    biopsy   BREAST LUMPECTOMY Right    chemo and radiation   BREAST LUMPECTOMY WITH RADIOACTIVE SEED LOCALIZATION Right 08/09/2016   Procedure: RIGHT BREAST LUMPECTOMY WITH RADIOACTIVE SEED LOCALIZATION;  Surgeon: Ovidio Kin, MD;  Location: [facility] OR;  Service: General;   Laterality: Right;   CARDIAC CATHETERIZATION     CATARACT EXTRACTION  2014   COLONOSCOPY  2011   diverticulosis   COLPOSCOPY     EYE SURGERY Bilateral    cataract extraction   FACIAL COSMETIC SURGERY     under both eyes   LYMPHADENECTOMY  1993   TONSILLECTOMY     age 28   TOTAL KNEE ARTHROPLASTY Right 05/22/2022   Procedure: TOTAL KNEE ARTHROPLASTY;  Surgeon: Durene Romans, MD;  Location: [facility] ORS;  Service: Orthopedics;  Laterality: Right;    Family History  Problem Relation Age of Onset   Breast cancer Mother        Age 10   Hypertension Father     Social History:  reports that she quit smoking about 23 years ago. Her smoking use included cigarettes. She started smoking about 53 years ago. She has a 30 pack-year smoking history. She has never used smokeless tobacco. She reports current alcohol use of about 14.0 standard drinks of alcohol per week. She reports that she does not use drugs.  BMI: Estimated body mass index is 18.88 kg/m as calculated from the following:   Height as of 03/08/23: 5\' 4"  (1.626 m).   Weight as of 03/08/23: 49.9 kg.  Lab Results  Component Value Date   ALBUMIN 4.3 01/21/2018   Diabetes: Patient does  not have a diagnosis of diabetes.     Smoking Status:       No medications prior to admission.     Physical Exam: Right shoulder demonstrates painful and profoundly limited motion as noted at recent office visits.  Strength is globally decreased secondary to pain and guarding.  Otherwise neurovascular intact in the right upper extremity.  Imaging studies of the right shoulder confirm severe osteoarthritic change with marked bony deformity  Vitals     Assessment/Plan  Impression: Right Shoulder Rotator Cuff Tear arthropathy  Plan of Action: Procedure(s): REVERSE SHOULDER ARTHROPLASTY  Alford Gamero M Jarren Para 03/14/2023, 5:59 AM Contact # [phone]

## 2023-03-14 NOTE — Anesthesia Procedure Notes (Signed)
Anesthesia Regional Block: Interscalene brachial plexus block   Pre-Anesthetic Checklist: , timeout performed,  Correct Patient, Correct Site, Correct Laterality,  Correct Procedure, Correct Position, site marked,  Risks and benefits discussed,  Surgical consent,  Pre-op evaluation,  At surgeon's request and post-op pain management  Laterality: Upper and Right  Prep: Maximum Sterile Barrier Precautions used, chloraprep       Needles:  Injection technique: Single-shot  Needle Type: Echogenic Needle     Needle Length: 5cm  Needle Gauge: 21     Additional Needles:   Procedures:,,,, ultrasound used (permanent image in chart),,    Narrative:  Start time: 03/14/2023 9:05 AM End time: 03/14/2023 9:11 AM Injection made incrementally with aspirations every 5 mL.  Performed by: Personally  Anesthesiologist: Trevor Iha, MD  Additional Notes: Block assessed prior to procedure. Patient tolerated procedure well.

## 2023-03-15 ENCOUNTER — Encounter (HOSPITAL_COMMUNITY): Payer: Self-pay | Admitting: Orthopedic Surgery

## 2023-03-27 DIAGNOSIS — Z471 Aftercare following joint replacement surgery: Secondary | ICD-10-CM | POA: Diagnosis not present

## 2023-03-27 DIAGNOSIS — Z96611 Presence of right artificial shoulder joint: Secondary | ICD-10-CM | POA: Diagnosis not present

## 2023-04-02 ENCOUNTER — Other Ambulatory Visit: Payer: PPO

## 2023-04-09 ENCOUNTER — Ambulatory Visit: Payer: PPO | Admitting: Thoracic Surgery (Cardiothoracic Vascular Surgery)

## 2023-04-19 DIAGNOSIS — M25511 Pain in right shoulder: Secondary | ICD-10-CM | POA: Diagnosis not present

## 2023-04-19 DIAGNOSIS — M25611 Stiffness of right shoulder, not elsewhere classified: Secondary | ICD-10-CM | POA: Diagnosis not present

## 2023-04-24 ENCOUNTER — Ambulatory Visit (INDEPENDENT_AMBULATORY_CARE_PROVIDER_SITE_OTHER)

## 2023-04-24 ENCOUNTER — Encounter: Payer: Self-pay | Admitting: Primary Care

## 2023-04-24 ENCOUNTER — Ambulatory Visit: Admitting: Primary Care

## 2023-04-24 VITALS — BP 140/74 | HR 83 | Temp 97.5°F | Ht 65.5 in | Wt 113.8 lb

## 2023-04-24 DIAGNOSIS — R911 Solitary pulmonary nodule: Secondary | ICD-10-CM

## 2023-04-24 DIAGNOSIS — J449 Chronic obstructive pulmonary disease, unspecified: Secondary | ICD-10-CM | POA: Diagnosis not present

## 2023-04-24 DIAGNOSIS — R0602 Shortness of breath: Secondary | ICD-10-CM

## 2023-04-24 DIAGNOSIS — Z96611 Presence of right artificial shoulder joint: Secondary | ICD-10-CM | POA: Diagnosis not present

## 2023-04-24 DIAGNOSIS — R918 Other nonspecific abnormal finding of lung field: Secondary | ICD-10-CM | POA: Diagnosis not present

## 2023-04-24 NOTE — Progress Notes (Signed)
 @Patient  ID: Anna York, female    DOB: 1940/12/10, 83 y.o.   MRN: [id number]  No chief complaint on file.   Referring provider: Rodrigo Ran, MD  HPI: 83 year old female, former smoker. PMH significant for COPD, TIA, osteoarthritis, osteoporosis, breast cancer, right total knee arthroplasty.  04/24/2023 Discussed the use of AI scribe software for clinical note transcription with the patient, who gave verbal consent to proceed.  History of Present Illness   Patient presents today for acute office visit due to shortness of breath.  She recently had shoulder surgery on March 14, 2023.  Follows with Dr. Everardo All from our office for history of COPD and centrilobular emphysema.  She is maintained on Trelegy 100 mcg and as needed albuterol.  She has experienced increased shortness of breath since the day of her surgery, which began immediately after waking up from anesthesia. The shortness of breath occurs primarily with exertion, such as walking or standing, and not while sitting. No associated chest tightness, wheezing, or significant cough.  She has a history of emphysema COPD and is under the care of Dr. Everardo All. Her last documented breathing test in 2019 showed moderate obstructive lung disease with lung function at 67% predicted. She uses Trelegy daily and carries an albuterol rescue inhaler, although she rarely uses it and does not notice a significant difference when she does. She mentions not being very active but does walk around and move regularly.  She is scheduled for a follow-up CT scan on April 15th to monitor a previously identified lung nodule. She has had pneumonia multiple times in the past.  No chest pain, wheezing, significant cough, hemoptysis, voice hoarseness, or dysphagia.      Allergies  Allergen Reactions   Erythromycin Nausea And Vomiting   Macrobid [Nitrofurantoin] Hives and Rash    Immunization History  Administered Date(s) Administered   Influenza  Split 11/20/2010, 12/10/2011, 11/19/2012   Influenza, High Dose Seasonal PF 10/21/2018, 11/25/2021   Influenza,inj,Quad PF,6+ Mos 11/19/2014   Influenza,inj,quad, With Preservative 11/19/2016   Influenza-Unspecified 12/03/2016, 11/24/2022   PFIZER(Purple Top)SARS-COV-2 Vaccination 03/10/2019, 03/28/2019, 11/04/2019   Pneumococcal Polysaccharide-23 02/19/2010   Pneumococcal-Unspecified 02/20/2015   Zoster Recombinant(Shingrix) 05/02/2018, 08/11/2018   Zoster, Live 02/20/2007    Past Medical History:  Diagnosis Date   ASCUS (atypical squamous cells of undetermined significance) on Pap smear    Neg high risk HPV   Asthma    Atrophic vaginitis    Backache, unspecified    Breast cancer (HCC) 1992   Chemo and Radiation   Constipation    COPD (chronic obstructive pulmonary disease) (HCC)    Cystitis    hx of frequent cystitis per pt at preop on 05/09/22.   Cystitis, unspecified    Diverticulosis    Dysphagia    Dyspnea    with exertion   GERD (gastroesophageal reflux disease)    indigestion   Heart murmur    patient denies but it is noted in several visit notes   Hypertension    Intermediate stage nonexudative age-related macular degeneration of left eye    Lymphocytic colitis    Memory loss    Mild   Osteoarthritis    Osteopenia 2012   T score -2.1   Osteoporosis    Palpitations    Personal history of colonic polyps 07/07/2002   hyperplastic   Personal history of malignant neoplasm of breast    Pneumonia    Stroke (HCC) 2010   2 TIA's    TIA (  transient ischemic attack)    Unspecified transient cerebral ischemia     Tobacco History: Social History   Tobacco Use  Smoking Status Former   Current packs/day: 0.00   Average packs/day: 1 pack/day for 30.0 years (30.0 ttl pk-yrs)   Types: Cigarettes   Start date: 02/19/1970   Quit date: 02/20/2000   Years since quitting: 23.1  Smokeless Tobacco Never   Counseling given: Not Answered   Outpatient Medications Prior to  Visit  Medication Sig Dispense Refill   acetaminophen (TYLENOL) 500 MG tablet Take 500 mg by mouth every 6 (six) hours as needed for headache (pain.).     albuterol (VENTOLIN HFA) 108 (90 Base) MCG/ACT inhaler Inhale 2 puffs into the lungs every 6 (six) hours as needed. 18 g 2   clopidogrel (PLAVIX) 75 MG tablet Take 75 mg by mouth in the morning.     cyclobenzaprine (FLEXERIL) 10 MG tablet Take 1 tablet (10 mg total) by mouth 3 (three) times daily as needed for muscle spasms. 30 tablet 1   Fluticasone-Umeclidin-Vilant (TRELEGY ELLIPTA) 100-62.5-25 MCG/ACT AEPB INHALE ONE PUFF INTO THE LUNGS DAILY 60 each 11   ibandronate (BONIVA) 150 MG tablet Take 150 mg by mouth every 30 (thirty) days. Take in the morning with a full glass of water, on an empty stomach, and do not take anything else by mouth or lie down for the next 30 min.     metoprolol succinate (TOPROL-XL) 25 MG 24 hr tablet Take 25 mg by mouth in the morning.     nystatin (MYCOSTATIN) 100000 UNIT/ML suspension Use as directed 5 mLs in the mouth or throat 2 (two) times daily.     omeprazole (PRILOSEC) 20 MG capsule Take 20 mg by mouth daily before breakfast.     ondansetron (ZOFRAN) 4 MG tablet Take 1 tablet (4 mg total) by mouth every 8 (eight) hours as needed for nausea or vomiting. 10 tablet 0   oxyCODONE-acetaminophen (PERCOCET) 5-325 MG tablet Take 1 tablet by mouth every 4 (four) hours as needed (max 6 q). 20 tablet 0   polyethylene glycol (MIRALAX / GLYCOLAX) 17 g packet Take 17 g by mouth 2 (two) times daily. (Patient not taking: Reported on 03/05/2023) 14 each 0   rosuvastatin (CRESTOR) 5 MG tablet Take 5 mg by mouth in the morning.     No facility-administered medications prior to visit.   Review of Systems  Review of Systems  Constitutional: Negative.  Negative for fever.  HENT: Negative.    Respiratory:  Positive for shortness of breath. Negative for cough, chest tightness and wheezing.   Cardiovascular: Negative.  Negative  for chest pain and leg swelling.   Physical Exam  There were no vitals taken for this visit. Physical Exam Constitutional:      General: She is not in acute distress.    Appearance: Normal appearance. She is not ill-appearing.  HENT:     Head: Normocephalic and atraumatic.  Cardiovascular:     Rate and Rhythm: Normal rate and regular rhythm.     Comments: RRR Pulmonary:     Effort: Pulmonary effort is normal.     Breath sounds: Normal breath sounds.     Comments: CTA Skin:    General: Skin is warm and dry.  Neurological:     General: No focal deficit present.     Mental Status: She is alert and oriented to person, place, and time. Mental status is at baseline.  Psychiatric:  Mood and Affect: Mood normal.        Behavior: Behavior normal.        Thought Content: Thought content normal.        Judgment: Judgment normal.      Lab Results:  CBC    Component Value Date/Time   WBC 7.6 03/08/2023 1406   RBC 4.32 03/08/2023 1406   HGB 13.6 03/08/2023 1406   HGB 13.6 01/21/2018 1403   HGB 13.5 01/15/2017 1427   HCT 42.9 03/08/2023 1406   HCT 40.9 01/15/2017 1427   PLT 295 03/08/2023 1406   PLT 240 01/21/2018 1403   PLT 232 01/15/2017 1427   MCV 99.3 03/08/2023 1406   MCV 95.1 01/15/2017 1427   MCH 31.5 03/08/2023 1406   MCHC 31.7 03/08/2023 1406   RDW 13.9 03/08/2023 1406   RDW 13.8 01/15/2017 1427   LYMPHSABS 0.7 01/21/2018 1403   LYMPHSABS 0.5 (L) 01/15/2017 1427   MONOABS 0.7 01/21/2018 1403   MONOABS 0.6 01/15/2017 1427   EOSABS 0.0 01/21/2018 1403   EOSABS 0.0 01/15/2017 1427   BASOSABS 0.1 01/21/2018 1403   BASOSABS 0.0 01/15/2017 1427    BMET    Component Value Date/Time   NA 135 03/08/2023 1406   NA 140 01/15/2017 1427   K 4.1 03/08/2023 1406   K 3.9 01/15/2017 1427   CL 102 03/08/2023 1406   CO2 24 03/08/2023 1406   CO2 20 (L) 01/15/2017 1427   GLUCOSE 105 (H) 03/08/2023 1406   GLUCOSE 93 01/15/2017 1427   BUN 19 03/08/2023 1406   BUN  12.7 01/15/2017 1427   CREATININE 0.58 03/08/2023 1406   CREATININE 0.87 01/21/2018 1403   CREATININE 0.8 01/15/2017 1427   CALCIUM 9.3 03/08/2023 1406   CALCIUM 9.3 01/15/2017 1427   GFRNONAA >60 03/08/2023 1406   GFRNONAA >60 01/21/2018 1403   GFRAA >60 01/21/2018 1403    BNP No results found for: "BNP"  ProBNP No results found for: "PROBNP"  Imaging: No results found.   Assessment & Plan:   1. Shortness of breath (Primary) - DG Chest 2 View; Future - D-Dimer, Quantitative; Future - D-Dimer, Quantitative - Basic Metabolic Panel (BMET); Future - CBC w/Diff; Future - CBC w/Diff - Basic Metabolic Panel (BMET) Assessment and Plan    Postoperative Shortness of Breath New onset shortness of breath following recent shoulder surgery. No associated chest tightness, wheezing, or abnormal cough. No voice changes or swallowing difficulties. No chest pain or hemoptysis. Shortness of breath primarily with exertion. -Ordered chest x-ray and blood work to rule out postoperative pneumonia or pulmonary embolism. -Plan to walk patient to assess oxygen levels during exertion. -Will follow up with patient regarding results of chest x-ray and labs.  Chronic Obstructive Pulmonary Disease (COPD) Last pulmonary function test in 2019 showed moderate obstructive lung disease with 67% predicted lung function. Patient is currently using Trelegy inhaler daily and has albuterol as a rescue inhaler. -Continue Trelegy inhaler daily and albuterol hfa 2 puffs every 4-6 hours as needed for breakthrough symptoms. -Schedule repeat pulmonary function test per order in December by Dr. Everardo All  Lung Nodule Identified on CT scan in the fall. Follow-up CT scan scheduled for April 15th with Dr. Robby Sermon. -Continue with scheduled follow-up CT scan.      Glenford Bayley, NP 04/24/2023

## 2023-04-24 NOTE — Patient Instructions (Addendum)
 -  POSTOPERATIVE SHORTNESS OF BREATH: This refers to the new difficulty in breathing you have experienced since your surgery. We have ordered a chest x-ray and blood work to check for any possible complications like pneumonia or a blood clot in the lungs. We will also assess your oxygen levels while you walk. We will follow up with you once we have the results.  -CHRONIC OBSTRUCTIVE PULMONARY DISEASE (COPD): COPD is a chronic lung condition that makes it hard to breathe. Your last test in 2019 showed moderate disease. Continue using your Trelegy inhaler daily and your albuterol inhaler as needed. We will schedule a repeat pulmonary function test and consult with Dr. Everardo All about your recent symptoms.  -LUNG NODULE: A lung nodule is a small growth in the lung. You have a follow-up CT scan scheduled for April 15th to monitor this nodule. Please keep this appointment.  INSTRUCTIONS: We will contact you with the results for your CXR and labs as soon as they come back. Continue using your Trelegy inhaler daily and your albuterol inhaler as needed. We will schedule a repeat pulmonary function test and consult with Dr. Everardo All. Keep your follow-up CT scan appointment on April 15th through Dr. Sunday Corn office.  Follow-up Please schedule PFTs first available

## 2023-04-25 ENCOUNTER — Telehealth: Payer: Self-pay

## 2023-04-25 ENCOUNTER — Ambulatory Visit (HOSPITAL_COMMUNITY)
Admission: RE | Admit: 2023-04-25 | Discharge: 2023-04-25 | Disposition: A | Discharge status: Home / Self Care | Source: Ambulatory Visit | Attending: Primary Care | Admitting: Primary Care

## 2023-04-25 DIAGNOSIS — C50911 Malignant neoplasm of unspecified site of right female breast: Secondary | ICD-10-CM | POA: Diagnosis not present

## 2023-04-25 DIAGNOSIS — R0602 Shortness of breath: Secondary | ICD-10-CM | POA: Diagnosis not present

## 2023-04-25 DIAGNOSIS — C50912 Malignant neoplasm of unspecified site of left female breast: Secondary | ICD-10-CM | POA: Diagnosis not present

## 2023-04-25 DIAGNOSIS — R918 Other nonspecific abnormal finding of lung field: Secondary | ICD-10-CM | POA: Diagnosis not present

## 2023-04-25 DIAGNOSIS — J432 Centrilobular emphysema: Secondary | ICD-10-CM | POA: Diagnosis not present

## 2023-04-25 LAB — CBC WITH DIFFERENTIAL/PLATELET
Basophils Absolute: 0.1 10*3/uL (ref 0.0–0.1)
Basophils Relative: 1.3 % (ref 0.0–3.0)
Eosinophils Absolute: 0 10*3/uL (ref 0.0–0.7)
Eosinophils Relative: 0.7 % (ref 0.0–5.0)
HCT: 40.9 % (ref 36.0–46.0)
Hemoglobin: 13.4 g/dL (ref 12.0–15.0)
Lymphocytes Relative: 14 % (ref 12.0–46.0)
Lymphs Abs: 0.9 10*3/uL (ref 0.7–4.0)
MCHC: 32.7 g/dL (ref 30.0–36.0)
MCV: 97.1 fl (ref 78.0–100.0)
Monocytes Absolute: 0.7 10*3/uL (ref 0.1–1.0)
Monocytes Relative: 10 % (ref 3.0–12.0)
Neutro Abs: 4.8 10*3/uL (ref 1.4–7.7)
Neutrophils Relative %: 74 % (ref 43.0–77.0)
Platelets: 301 10*3/uL (ref 150.0–400.0)
RBC: 4.21 Mil/uL (ref 3.87–5.11)
RDW: 14.2 % (ref 11.5–15.5)
WBC: 6.5 10*3/uL (ref 4.0–10.5)

## 2023-04-25 LAB — BASIC METABOLIC PANEL
BUN: 14 mg/dL (ref 6–23)
CO2: 23 meq/L (ref 19–32)
Calcium: 9.9 mg/dL (ref 8.4–10.5)
Chloride: 102 meq/L (ref 96–112)
Creatinine, Ser: 0.69 mg/dL (ref 0.40–1.20)
GFR: 81.2 mL/min (ref >60.00)
Glucose, Bld: 96 mg/dL (ref 70–99)
Potassium: 4.3 meq/L (ref 3.5–5.1)
Sodium: 138 meq/L (ref 135–145)

## 2023-04-25 MED ORDER — IOHEXOL 350 MG/ML SOLN
75.0000 mL | Freq: Once | INTRAVENOUS | Status: AC | PRN
  Administered 2023-04-25: 75 mL via INTRAVENOUS

## 2023-04-25 NOTE — Telephone Encounter (Signed)
 I received a secure chat from Jone Baseman, NP regarding pt. Per Waynetta Sandy,   "Merrick Feutz - please let patient know CXR showed possible PNA, d-dimer is elevated (can be elevated in infections but also need to rule out blood clot since she had surgery recently). Needs stat CTA  (I will order). Jeanice Lim have them hold her, please call office number and ask for provider of the day if its after 12pm today "  I called and informed pt. Pt verbalized understanding. NFN

## 2023-04-25 NOTE — Progress Notes (Addendum)
 D-dimer elevated. Seen yesterday for new shortness of breath. Rfecent shoulder surgery in January. No fever or cough. CXR showed possible PNA. Ordering CTA. Holding off on abx until CT imaging completed.

## 2023-04-25 NOTE — Addendum Note (Signed)
 Addended by: Glenford Bayley on: 04/25/2023 09:39 AM   Modules accepted: Orders

## 2023-04-26 LAB — D-DIMER, QUANTITATIVE: D-Dimer, Quant: 1.23 ug{FEU}/mL — ABNORMAL HIGH (ref <0.50)

## 2023-04-26 LAB — BASIC METABOLIC PANEL

## 2023-04-26 LAB — CBC WITH DIFFERENTIAL/PLATELET

## 2023-04-26 NOTE — Progress Notes (Signed)
 CTA was negative for a pulmonary embolism or blood clot, no pneumonia on CT imaging. Stable lund nodule. Extensive emphysema right upper lung. No acute findings. Ill see what Dr. Everardo All recommend, but nothing concerning was seen on imaging. Continue to use trelegy.   Dr. Everardo All can you review my note, dyspnea started after anesthesiology. No cough, fever or wheezing present on exam. No voice changes or trouble swallowing .

## 2023-05-01 DIAGNOSIS — H353221 Exudative age-related macular degeneration, left eye, with active choroidal neovascularization: Secondary | ICD-10-CM | POA: Diagnosis not present

## 2023-05-03 DIAGNOSIS — M25511 Pain in right shoulder: Secondary | ICD-10-CM | POA: Diagnosis not present

## 2023-05-08 DIAGNOSIS — M25511 Pain in right shoulder: Secondary | ICD-10-CM | POA: Diagnosis not present

## 2023-05-10 DIAGNOSIS — M25511 Pain in right shoulder: Secondary | ICD-10-CM | POA: Diagnosis not present

## 2023-05-14 DIAGNOSIS — M25511 Pain in right shoulder: Secondary | ICD-10-CM | POA: Diagnosis not present

## 2023-05-14 DIAGNOSIS — M25611 Stiffness of right shoulder, not elsewhere classified: Secondary | ICD-10-CM | POA: Diagnosis not present

## 2023-05-17 DIAGNOSIS — M25511 Pain in right shoulder: Secondary | ICD-10-CM | POA: Diagnosis not present

## 2023-05-17 DIAGNOSIS — M25611 Stiffness of right shoulder, not elsewhere classified: Secondary | ICD-10-CM | POA: Diagnosis not present

## 2023-05-20 ENCOUNTER — Ambulatory Visit (HOSPITAL_BASED_OUTPATIENT_CLINIC_OR_DEPARTMENT_OTHER): Admitting: Pulmonary Disease

## 2023-05-20 DIAGNOSIS — J432 Centrilobular emphysema: Secondary | ICD-10-CM | POA: Diagnosis not present

## 2023-05-20 LAB — PULMONARY FUNCTION TEST
DL/VA % pred: 50 %
DL/VA: 2.04 ml/min/mmHg/L
DLCO cor % pred: 42 %
DLCO cor: 8.1 ml/min/mmHg
DLCO unc % pred: 42 %
DLCO unc: 8.1 ml/min/mmHg
FEF 25-75 Post: 0.68 L/s
FEF 25-75 Pre: 0.66 L/s
FEF2575-%Change-Post: 3 %
FEF2575-%Pred-Post: 49 %
FEF2575-%Pred-Pre: 48 %
FEV1-%Change-Post: 0 %
FEV1-%Pred-Post: 77 %
FEV1-%Pred-Pre: 77 %
FEV1-Post: 1.5 L
FEV1-Pre: 1.5 L
FEV1FVC-%Change-Post: 0 %
FEV1FVC-%Pred-Pre: 75 %
FEV6-%Change-Post: 1 %
FEV6-%Pred-Post: 107 %
FEV6-%Pred-Pre: 105 %
FEV6-Post: 2.63 L
FEV6-Pre: 2.6 L
FEV6FVC-%Change-Post: 0 %
FEV6FVC-%Pred-Post: 103 %
FEV6FVC-%Pred-Pre: 103 %
FVC-%Change-Post: 0 %
FVC-%Pred-Post: 103 %
FVC-%Pred-Pre: 103 %
FVC-Post: 2.69 L
FVC-Pre: 2.68 L
Post FEV1/FVC ratio: 56 %
Post FEV6/FVC ratio: 98 %
Pre FEV1/FVC ratio: 56 %
Pre FEV6/FVC Ratio: 98 %
RV % pred: 116 %
RV: 2.82 L
TLC % pred: 108 %
TLC: 5.54 L

## 2023-05-20 NOTE — Patient Instructions (Signed)
Performed full PFT today. ?

## 2023-05-20 NOTE — Progress Notes (Signed)
Performed full PFT today. ?

## 2023-05-29 DIAGNOSIS — H353221 Exudative age-related macular degeneration, left eye, with active choroidal neovascularization: Secondary | ICD-10-CM | POA: Diagnosis not present

## 2023-06-03 ENCOUNTER — Other Ambulatory Visit (HOSPITAL_COMMUNITY): Payer: Self-pay | Admitting: Adult Health

## 2023-06-03 DIAGNOSIS — M48061 Spinal stenosis, lumbar region without neurogenic claudication: Secondary | ICD-10-CM | POA: Diagnosis not present

## 2023-06-03 DIAGNOSIS — M545 Low back pain, unspecified: Secondary | ICD-10-CM

## 2023-06-03 DIAGNOSIS — I10 Essential (primary) hypertension: Secondary | ICD-10-CM | POA: Diagnosis not present

## 2023-06-04 ENCOUNTER — Ambulatory Visit
Admission: RE | Admit: 2023-06-04 | Discharge: 2023-06-04 | Disposition: A | Discharge status: Home / Self Care | Payer: PPO | Source: Ambulatory Visit | Attending: Thoracic Surgery (Cardiothoracic Vascular Surgery) | Admitting: Thoracic Surgery (Cardiothoracic Vascular Surgery)

## 2023-06-04 DIAGNOSIS — Z853 Personal history of malignant neoplasm of breast: Secondary | ICD-10-CM | POA: Diagnosis not present

## 2023-06-04 DIAGNOSIS — J439 Emphysema, unspecified: Secondary | ICD-10-CM | POA: Diagnosis not present

## 2023-06-04 DIAGNOSIS — R918 Other nonspecific abnormal finding of lung field: Secondary | ICD-10-CM

## 2023-06-04 DIAGNOSIS — I7121 Aneurysm of the ascending aorta, without rupture: Secondary | ICD-10-CM | POA: Diagnosis not present

## 2023-06-06 ENCOUNTER — Ambulatory Visit (HOSPITAL_COMMUNITY): Attending: Adult Health

## 2023-06-06 ENCOUNTER — Encounter (HOSPITAL_COMMUNITY): Payer: Self-pay

## 2023-06-10 DIAGNOSIS — Z96611 Presence of right artificial shoulder joint: Secondary | ICD-10-CM | POA: Diagnosis not present

## 2023-06-10 DIAGNOSIS — Z5189 Encounter for other specified aftercare: Secondary | ICD-10-CM | POA: Diagnosis not present

## 2023-06-11 ENCOUNTER — Ambulatory Visit: Payer: PPO | Admitting: Thoracic Surgery (Cardiothoracic Vascular Surgery)

## 2023-06-11 VITALS — BP 123/76 | HR 98 | Resp 20 | Ht 64.0 in | Wt 114.0 lb

## 2023-06-11 DIAGNOSIS — R918 Other nonspecific abnormal finding of lung field: Secondary | ICD-10-CM

## 2023-06-11 NOTE — Progress Notes (Signed)
 [address]       [place] York [address]             [phone]      HPI: Anna York returns for follow-up of lung nodules.  Anna York is an 83 year old woman with a history of tobacco abuse, COPD, multiple pneumonias, breast cancer, TIA, reflux, osteopenia, osteoarthritis, ascending aneurysm, and multiple pulmonary nodules.  She had pneumonia in 2023.  CT showed a right hilar soft tissue opacity and extensive left lower lobe consolidation.  Interval PET/CT was done for follow-up which showed clearing of the left lower lobe but persistent consolidation in the right upper lobe.  There was area of complex architectural distortion in the right lower lobe that did not have metabolic activity.  She has been followed since then.  I last saw her in November 2024.  Her previous findings were all stable but there was a new 7 mm nodule in the left lower lobe.  She now returns with a 62-month follow-up.  In the interim since her last visit she had shoulder surgery in January.  Says her breathing has really never returned to baseline since then but she has an appointment with Dr. Washington Hacker next week.  Past Medical History:  Diagnosis Date   ASCUS (atypical squamous cells of undetermined significance) on Pap smear    Neg high risk HPV   Asthma    Atrophic vaginitis    Backache, unspecified    Breast cancer (HCC) 1992   Chemo and Radiation   Constipation    COPD (chronic obstructive pulmonary disease) (HCC)    Cystitis    hx of frequent cystitis per pt at preop on 05/09/22.   Cystitis, unspecified    Diverticulosis    Dysphagia    Dyspnea    with exertion   GERD (gastroesophageal reflux disease)    indigestion   Heart murmur    patient denies but it is noted in several visit notes   Hypertension    Intermediate stage nonexudative age-related macular degeneration of left eye    Lymphocytic colitis    Memory loss    Mild   Osteoarthritis    Osteopenia 2012    T score -2.1   Osteoporosis    Palpitations    Personal history of colonic polyps 07/07/2002   hyperplastic   Personal history of malignant neoplasm of breast    Pneumonia    Stroke (HCC) 2010   2 TIA's    TIA (transient ischemic attack)    Unspecified transient cerebral ischemia     Current Outpatient Medications  Medication Sig Dispense Refill   acetaminophen  (TYLENOL ) 500 MG tablet Take 500 mg by mouth every 6 (six) hours as needed for headache (pain.).     albuterol  (VENTOLIN  HFA) 108 (90 Base) MCG/ACT inhaler Inhale 2 puffs into the lungs every 6 (six) hours as needed. 18 g 2   clopidogrel  (PLAVIX ) 75 MG tablet Take 75 mg by mouth in the morning.     Fluticasone -Umeclidin-Vilant (TRELEGY ELLIPTA ) 100-62.5-25 MCG/ACT AEPB INHALE ONE PUFF INTO THE LUNGS DAILY 60 each 11   metoprolol  succinate (TOPROL -XL) 25 MG 24 hr tablet Take 25 mg by mouth in the morning.     nystatin  (MYCOSTATIN ) 100000 UNIT/ML suspension Use as directed 5 mLs in the mouth or throat 2 (two) times daily.     omeprazole (PRILOSEC) 20 MG capsule Take 20 mg by mouth daily before breakfast.     oxyCODONE -acetaminophen  (PERCOCET)  5-325 MG tablet Take 1 tablet by mouth every 4 (four) hours as needed (max 6 q). 20 tablet 0   rosuvastatin  (CRESTOR ) 5 MG tablet Take 5 mg by mouth in the morning.     cyclobenzaprine  (FLEXERIL ) 10 MG tablet Take 1 tablet (10 mg total) by mouth 3 (three) times daily as needed for muscle spasms. (Patient not taking: Reported on 06/11/2023) 30 tablet 1   ibandronate (BONIVA) 150 MG tablet Take 150 mg by mouth every 30 (thirty) days. Take in the morning with a full glass of water , on an empty stomach, and do not take anything else by mouth or lie down for the next 30 min. (Patient not taking: Reported on 06/11/2023)     ondansetron  (ZOFRAN ) 4 MG tablet Take 1 tablet (4 mg total) by mouth every 8 (eight) hours as needed for nausea or vomiting. 10 tablet 0   polyethylene glycol (MIRALAX  / GLYCOLAX ) 17  g packet Take 17 g by mouth 2 (two) times daily. (Patient not taking: Reported on 06/11/2023) 14 each 0   No current facility-administered medications for this visit.    Physical Exam BP 123/76   Pulse 98   Resp 20   Ht 5\' 4"  (1.626 m)   Wt 114 lb (51.7 kg)   SpO2 97% Comment: RA  BMI 19.92 kg/m  83 year old woman in no acute distress Alert and oriented x 3 with no focal deficits Lungs diminished bilaterally but otherwise clear, no wheezing Cardiac regular rate and rhythm No cervical supraclavicular adenopathy  Diagnostic Tests: CT CHEST WITHOUT CONTRAST   TECHNIQUE: Multidetector CT imaging of the chest was performed following the standard protocol without IV contrast.   RADIATION DOSE REDUCTION: This exam was performed according to the departmental dose-optimization program which includes automated exposure control, adjustment of the mA and/or kV according to patient size and/or use of iterative reconstruction technique.   COMPARISON:  April 25, 2023, January 01, 2023, Jun 26, 2022, March 08, 2022, December 12, 2021, October 19, 2021   FINDINGS: Cardiovascular: No cardiomegaly or pericardial effusion. Unchanged ascending aortic aneurysm measuring 4.3 cm. Scattered aortic atherosclerosis. Similar diffuse dilation of the distal aortic arch and the descending aorta, measuring up to 3.8 cm, unchanged.   Mediastinum/Nodes: No mediastinal mass. No mediastinal, hilar, or axillary lymphadenopathy. Left axillary lymph node dissection.   Lungs/Pleura: The midline trachea and bronchi are patent. Advanced destructive emphysema with an upper lobe predominance. Biapical pleuroparenchymal scarring. No focal airspace consolidation, pleural effusion, or pneumothorax. Posterior right upper lobe nodule (axial 47), measures 1 x 1.8 cm, unchanged. A smaller nodule in the posterior right upper lobe abutting the oblique fissure measures 0.8 by 1 cm, unchanged by my measurement). The region  of subpleural nodularity in the left lower lobe under prior study has resolved in the interim. Unchanged 5 mm subpleural nodule left lower lobe (axial 119). Additionally, the region of thick-walled cavitary change in the superior segment of the right lower lobe is also unchanged, measuring 4 x 3.5 cm (previously, 4.1 x 3.3 cm, by my measurement).   Musculoskeletal: No acute fracture or destructive bone lesion. Right glenohumeral joint arthroplasty is well aligned without dislocation. Osteopenia. Multilevel degenerative disc disease of the spine.   Upper Abdomen: No acute abnormality in the partially visualized upper abdomen.   IMPRESSION: 1. Advanced destructive emphysema. No pneumonia, pulmonary edema, or pleural effusion. 2. Overall, unchanged appearance of the scattered right upper and left lower lobe pulmonary nodules, as described above. Additionally, the thick-walled region  of cavitary change in the superior segment of the right lower lobe is also unchanged, measuring 4 x 3.5 cm. Continued follow-up per routine oncologic protocols recommended. 3. Similar fusiform aneurysmal dilation of the ascending and thoracic aorta measuring up to 4.3 cm in the ascending segment. Continued attention on follow-up imaging recommended.   Aortic Atherosclerosis (ICD10-I70.0) and Emphysema (ICD10-J43.9).   Aortic aneurysm NOS (ICD10-I71.9).     Electronically Signed   By: Rance Burrows M.D.   On: 06/04/2023 17:09   I personally reviewed the CT images.  Complex scan.  Advanced emphysema.  Multiple nodular lung opacities unchanged.  Architectural distortion right lower lobe unchanged.  New 7 mm nodule from previous scan resolved.  Remaining nodule stable.  4.3 cm ascending aneurysm unchanged.  Impression: Anna York is an 83 year old woman with a history of tobacco abuse (quit 2002), COPD, multiple pneumonias, breast cancer, TIA, reflux, osteopenia, osteoarthritis, and ascending  aortic aneurysm, and multiple pulmonary nodules.   Pulmonary nodules Right upper lobe nodules in area of previous pneumonia-stable dating back 2 years.  Continue to monitor.  Architectural distortion right lower lobe-unchanged dating back to August 2023.  Continue to monitor.  Left lower lobe nodule-new in November 2024.  Resolved.  Smaller 5 mm subpleural nodule near base unchanged.  Ascending aneurysm-4.1 cm by my measurements.  Stable.  Will continue to follow-up.  Plan: Return in 6 months with CT chest  Anna Higashi, MD [facility] [phone]

## 2023-06-13 ENCOUNTER — Encounter (HOSPITAL_BASED_OUTPATIENT_CLINIC_OR_DEPARTMENT_OTHER): Payer: Self-pay | Admitting: Pulmonary Disease

## 2023-06-13 ENCOUNTER — Ambulatory Visit (HOSPITAL_BASED_OUTPATIENT_CLINIC_OR_DEPARTMENT_OTHER): Admitting: Pulmonary Disease

## 2023-06-13 VITALS — BP 116/70 | HR 88 | Ht 64.0 in | Wt 114.5 lb

## 2023-06-13 DIAGNOSIS — R918 Other nonspecific abnormal finding of lung field: Secondary | ICD-10-CM | POA: Diagnosis not present

## 2023-06-13 DIAGNOSIS — R5381 Other malaise: Secondary | ICD-10-CM

## 2023-06-13 DIAGNOSIS — J439 Emphysema, unspecified: Secondary | ICD-10-CM | POA: Diagnosis not present

## 2023-06-13 DIAGNOSIS — Z87891 Personal history of nicotine dependence: Secondary | ICD-10-CM

## 2023-06-13 DIAGNOSIS — J449 Chronic obstructive pulmonary disease, unspecified: Secondary | ICD-10-CM

## 2023-06-13 MED ORDER — TRELEGY ELLIPTA 200-62.5-25 MCG/ACT IN AEPB: 1.0000 | INHALATION_SPRAY | Freq: Every day | RESPIRATORY_TRACT | Status: AC

## 2023-06-13 NOTE — Patient Instructions (Signed)
 Emphysema - worsening dyspnea post-op that is unchanged Deconditioning --Reviewed pulmonary function tests. Improved obstruction to mild --TRY INCREASED strength of Trelegy 200 ONE puff ONCE a day. If you tolerate this please call our office so we can write a prescription --CONTINUE Albuterol  AS NEEDED for shortness of breath or wheezing --Continue flutter valve as needed. Use this twice a day. 5 inhalations at a time for mucous production --Continue mucinex 600 mg daily as needed. You can purchase this over-the-counter --REFER to pulmonary rehab

## 2023-06-13 NOTE — Progress Notes (Signed)
 Subjective:   PATIENT ID: Anna York GENDER: female DOB: 1940/03/21, MRN: [id number]  Chief Complaint  Patient presents with   Follow-up    Centrilobular emphysema    Reason for Visit: Follow-up  [name] is 83 year old with COPD, emphysema, osteoporosis, TIA, hx breast cancer who presents for follow-upl  Initial consult She was previously followed by Dr. Baldwin York for COPD. She is compliant with Trelegy daily. She continues to have mucous production especially after her pneumonia in the summer. Denies shortness of breath or wheezing.   She recently was seen by CTS for abnormal CT with bilateral pulmonary infiltrates. Earlier this summer she was treated for pneumonia and had a CXR demonstrating right upper lobe and left lower lobe abnormalities. Follow-up CT and PET/CT were ordered and demonstrated right upper lobe atelectasis and left lower lobe consolidation - that was improved on latest scan.  01/13/22 Stable on Trelegy.  Flutter valve has been helping with mucus production.  Has not yet picked up Mucinex.  COPD otherwise is well-controlled, denies shortness of breath or wheezing.  No limitation in activity.  She was recently seen by Dr. Luna York cardiothoracic clinic.  Note reviewed from 12/12/2021.  01/22/23 Since our last visit she reports doing fine with the Trelegy. Previously used flutter valve but has not needed it in recent months and has not had mucous production. Uses mucinex daily which helps. Has some shortness of breath with activity. No coughing or wheezing. No limitation in activity. No exacerbations in the last year.  06/13/23 Since our last visit she had shoulder surgery at the end of January and post-op she had persistent dyspnea with short distances. Denies coughing or wheezing. She was seen by NP Anna York on 04/24/23 and in-clinic ambulatory walk with no desaturations and outpatient CTA with negative pulmonary embolism and stable chronic issues  including emphysema. CT chest 06/04/23 with stable nodules in right upper lobe and left upper lobe. She has completed physical therapy for her shoulder. But has not been active from a cardiopulmonary standpoint. She has compliant with Trelegy. No exacerbations since our last visit.   Social History: Former smoker. 30 pack- years. Quit >20 years ago.  Past Medical History:  Diagnosis Date   ASCUS (atypical squamous cells of undetermined significance) on Pap smear    Neg high risk HPV   Asthma    Atrophic vaginitis    Backache, unspecified    Breast cancer (HCC) 1992   Chemo and Radiation   Constipation    COPD (chronic obstructive pulmonary disease) (HCC)    Cystitis    hx of frequent cystitis per pt at preop on 05/09/22.   Cystitis, unspecified    Diverticulosis    Dysphagia    Dyspnea    with exertion   GERD (gastroesophageal reflux disease)    indigestion   Heart murmur    patient denies but it is noted in several visit notes   Hypertension    Intermediate stage nonexudative age-related macular degeneration of left eye    Lymphocytic colitis    Memory loss    Mild   Osteoarthritis    Osteopenia 2012   T score -2.1   Osteoporosis    Palpitations    Personal history of colonic polyps 07/07/2002   hyperplastic   Personal history of malignant neoplasm of breast    Pneumonia    Stroke [facility]) 2010   2 TIA's    TIA (transient ischemic attack)    Unspecified transient  cerebral ischemia      Family History  Problem Relation Age of Onset   Breast cancer Mother        Age 69   Hypertension Father      Social History   Occupational History   Occupation: Veterinary surgeon    Comment: [employer]    Employer: [employer]  Tobacco Use   Smoking status: Former    Current packs/day: 0.00    Average packs/day: 1 pack/day for 30.0 years (30.0 ttl pk-yrs)    Types: Cigarettes    Start date: 02/19/1970    Quit date: 02/20/2000    Years since quitting: 23.3   Smokeless tobacco: Never   Vaping Use   Vaping status: Never Used  Substance and Sexual Activity   Alcohol  use: Yes    Alcohol /week: 14.0 standard drinks of alcohol     Types: 14 Glasses of wine per week    Comment: 3 glsses wine daily   Drug use: No   Sexual activity: Not Currently    Birth control/protection: Post-menopausal    Comment: 1st intercourse 83 yo-Fewer than 5 partners    Allergies  Allergen Reactions   Erythromycin Nausea And Vomiting   Macrobid [Nitrofurantoin] Hives and Rash     Outpatient Medications Prior to Visit  Medication Sig Dispense Refill   acetaminophen  (TYLENOL ) 500 MG tablet Take 500 mg by mouth every 6 (six) hours as needed for headache (pain.).     albuterol  (VENTOLIN  HFA) 108 (90 Base) MCG/ACT inhaler Inhale 2 puffs into the lungs every 6 (six) hours as needed. 18 g 2   clopidogrel  (PLAVIX ) 75 MG tablet Take 75 mg by mouth in the morning.     cyclobenzaprine  (FLEXERIL ) 10 MG tablet Take 1 tablet (10 mg total) by mouth 3 (three) times daily as needed for muscle spasms. 30 tablet 1   Fluticasone -Umeclidin-Vilant (TRELEGY ELLIPTA ) 100-62.5-25 MCG/ACT AEPB INHALE ONE PUFF INTO THE LUNGS DAILY 60 each 11   ibandronate (BONIVA) 150 MG tablet Take 150 mg by mouth every 30 (thirty) days. Take in the morning with a full glass of water , on an empty stomach, and do not take anything else by mouth or lie down for the next 30 min.     metoprolol  succinate (TOPROL -XL) 25 MG 24 hr tablet Take 25 mg by mouth in the morning.     nystatin  (MYCOSTATIN ) 100000 UNIT/ML suspension Use as directed 5 mLs in the mouth or throat 2 (two) times daily.     omeprazole (PRILOSEC) 20 MG capsule Take 20 mg by mouth daily before breakfast.     ondansetron  (ZOFRAN ) 4 MG tablet Take 1 tablet (4 mg total) by mouth every 8 (eight) hours as needed for nausea or vomiting. 10 tablet 0   oxyCODONE -acetaminophen  (PERCOCET) 5-325 MG tablet Take 1 tablet by mouth every 4 (four) hours as needed (max 6 q). 20 tablet 0    polyethylene glycol (MIRALAX  / GLYCOLAX ) 17 g packet Take 17 g by mouth 2 (two) times daily. 14 each 0   rosuvastatin  (CRESTOR ) 5 MG tablet Take 5 mg by mouth in the morning.     No facility-administered medications prior to visit.    Review of Systems  Constitutional:  Negative for chills, diaphoresis, fever, malaise/fatigue and weight loss.  HENT:  Negative for congestion.   Respiratory:  Positive for shortness of breath. Negative for cough, hemoptysis, sputum production and wheezing.   Cardiovascular:  Negative for chest pain, palpitations and leg swelling.  Objective:   Vitals:   06/13/23 1318  BP: 116/70  Pulse: 88  SpO2: 95%  Weight: 114 lb 8 oz (51.9 kg)  Height: 5\' 4"  (1.626 m)   SpO2: 95 %  Physical Exam: General: Well-appearing, no acute distress HENT: [place], AT Eyes: EOMI, no scleral icterus Respiratory: Clear to auscultation bilaterally.  No crackles, wheezing or rales Cardiovascular: RRR, -M/R/G, no JVD Extremities:-Edema,-tenderness Neuro: AAO x4, CNII-XII grossly intact Psych: Normal mood, normal affect   Data Reviewed:  Imaging: CT Chest 10/09/21 - RUL subsegmental atelectasis. Right hilar opacity measuring 3.1 x 1.4 cm. LLL consolidation. Emphysema with upper lobe predominance. LLL with honeycombed appearance vs interstitial thickening of previously seen emphysema in 03/02/17 PET/CT 10/19/21 - Partial right upper lobe collapse. Interval left lower lobe consolidation improved compared to prior CT 10/09/21 CT Chest 12/12/21 - Decrease in RUL opacity, unchanged RLL scarring. Near resolution of LLL consdolidation. Improved mediastinal adenopathy CT Chest 01/01/23 - New LLL subpleural nodule measuring 7mm. Stable prior nodules CTA 04/25/23 - negative pulmonary embolism and stable chronic issues including emphysema. CT chest 06/04/23 with stable nodules in right upper lobe and left upper lobe  PFT: 03/26/17 FVC 2.63 (93%) FEV1 1.41 (67%) Ratio 54  TLC 120% DLCO not  complete Interpretation: Moderate obstructive defect with air trapping and hyperinflation  06/13/23 FVC 2.69 (103%) FEV1 1.50 (77%) Ratio 56  TLC 108/% DLCO 42% Interpretation: Mild  obstructive defect with moderately reduced gas exchange   Labs: CBC    Component Value Date/Time   WBC 6.5 04/24/2023 1613   RBC 4.21 04/24/2023 1613   HGB 13.4 04/24/2023 1613   HGB 13.6 01/21/2018 1403   HGB 13.5 01/15/2017 1427   HCT 40.9 04/24/2023 1613   HCT 40.9 01/15/2017 1427   PLT 301.0 04/24/2023 1613   PLT 240 01/21/2018 1403   PLT 232 01/15/2017 1427   MCV 97.1 04/24/2023 1613   MCV 95.1 01/15/2017 1427   MCH 31.5 03/08/2023 1406   MCHC 32.7 04/24/2023 1613   RDW 14.2 04/24/2023 1613   RDW 13.8 01/15/2017 1427   LYMPHSABS 0.9 04/24/2023 1613   LYMPHSABS 0.5 (L) 01/15/2017 1427   MONOABS 0.7 04/24/2023 1613   MONOABS 0.6 01/15/2017 1427   EOSABS 0.0 04/24/2023 1613   EOSABS 0.0 01/15/2017 1427   BASOSABS 0.1 04/24/2023 1613   BASOSABS 0.0 01/15/2017 1427   Absolute eosinophils - 0 01/2018     Assessment & Plan:   Discussion: 83 year old with COPD, emphysema, osteoporosis, TIA, hx breast cancer who presents for follow-up. Emphysema worsened recently after post-op surgery without improvement. Although she has been participating in PT for her shoulder suspect deconditioning with overall sedentary lifestyle.   Emphysema - worsening dyspnea post-op that is unchanged Deconditioning --Reviewed pulmonary function tests. Improved obstruction to mild --TRY INCREASED strength of Trelegy 200 ONE puff ONCE a day. If you tolerate this please call our office so we can write a prescription --CONTINUE Albuterol  AS NEEDED for shortness of breath or wheezing --Continue flutter valve as needed. Use this twice a day. 5 inhalations at a time for mucous production --Continue mucinex 600 mg daily as needed. You can purchase this over-the-counter --REFER to pulmonary rehab  Multiple pulmonary  nodules --CT surveillance with Dr. Luna York  Health Maintenance Immunization History  Administered Date(s) Administered   Influenza Split 11/20/2010, 12/10/2011, 11/19/2012   Influenza, High Dose Seasonal PF 10/21/2018, 11/25/2021   Influenza,inj,Quad PF,6+ Mos 11/19/2014   Influenza,inj,quad, With Preservative 11/19/2016   Influenza-Unspecified 12/03/2016,  11/24/2022   PFIZER(Purple Top)SARS-COV-2 Vaccination 03/10/2019, 03/28/2019, 11/04/2019   Pneumococcal Polysaccharide-23 02/19/2010   Pneumococcal-Unspecified 02/20/2015   Zoster Recombinant(Shingrix) 05/02/2018, 08/11/2018   Zoster, Live 02/20/2007   CT Lung Screen - not qualified due to age.  Orders Placed This Encounter  Procedures   AMB referral to pulmonary rehabilitation    Referral Priority:   Routine    Referral Type:   Consultation    Number of Visits Requested:   1   Meds ordered this encounter  Medications   Fluticasone -Umeclidin-Vilant (TRELEGY ELLIPTA ) 200-62.5-25 MCG/ACT AEPB    Sig: Inhale 1 puff into the lungs daily.    Lot Number?:   [id number]    Expiration Date?:   03/21/2024    NDC:   4098-1191-47 [332750]    Quantity:   1    Return in about 6 months (around 12/13/2023).   I have spent a total time of 30-minutes on the day of the appointment including chart review, data review, collecting history, coordinating care and discussing medical diagnosis and plan with the patient/family. Past medical history, allergies, medications were reviewed. Pertinent imaging, labs and tests included in this note have been reviewed and interpreted independently by me.  Aksel Bencomo Genetta Kenning, MD [facility]

## 2023-06-14 ENCOUNTER — Telehealth (HOSPITAL_COMMUNITY): Payer: Self-pay

## 2023-06-14 ENCOUNTER — Encounter (HOSPITAL_COMMUNITY): Payer: Self-pay

## 2023-06-14 NOTE — Telephone Encounter (Signed)
 Pt insurance is active and benefits verified through [employer]. Co-pay $15, DED $0/$0 met, out of pocket $3,400/$685 met, co-insurance 0%.  Pre-authorization required for [id number] ONLY. 06/14/2023 @ 12:59pm, spoke with Halford Levels, REF# [id number].

## 2023-06-14 NOTE — Telephone Encounter (Signed)
 Called and left message regarding Pulmonary Rehab.  Mailed letter.

## 2023-06-19 ENCOUNTER — Ambulatory Visit (HOSPITAL_COMMUNITY)

## 2023-06-21 ENCOUNTER — Ambulatory Visit (HOSPITAL_COMMUNITY)
Admission: RE | Admit: 2023-06-21 | Discharge: 2023-06-21 | Disposition: A | Discharge status: Home / Self Care | Source: Ambulatory Visit | Attending: Adult Health | Admitting: Adult Health

## 2023-06-21 DIAGNOSIS — M4316 Spondylolisthesis, lumbar region: Secondary | ICD-10-CM | POA: Diagnosis not present

## 2023-06-21 DIAGNOSIS — M545 Low back pain, unspecified: Secondary | ICD-10-CM | POA: Insufficient documentation

## 2023-06-21 DIAGNOSIS — M51369 Other intervertebral disc degeneration, lumbar region without mention of lumbar back pain or lower extremity pain: Secondary | ICD-10-CM | POA: Diagnosis not present

## 2023-06-21 DIAGNOSIS — M48061 Spinal stenosis, lumbar region without neurogenic claudication: Secondary | ICD-10-CM | POA: Diagnosis not present

## 2023-06-21 DIAGNOSIS — M47816 Spondylosis without myelopathy or radiculopathy, lumbar region: Secondary | ICD-10-CM | POA: Diagnosis not present

## 2023-07-01 DIAGNOSIS — H35033 Hypertensive retinopathy, bilateral: Secondary | ICD-10-CM | POA: Diagnosis not present

## 2023-07-01 DIAGNOSIS — H35373 Puckering of macula, bilateral: Secondary | ICD-10-CM | POA: Diagnosis not present

## 2023-07-01 DIAGNOSIS — Z961 Presence of intraocular lens: Secondary | ICD-10-CM | POA: Diagnosis not present

## 2023-07-01 DIAGNOSIS — H43812 Vitreous degeneration, left eye: Secondary | ICD-10-CM | POA: Diagnosis not present

## 2023-07-01 DIAGNOSIS — H353112 Nonexudative age-related macular degeneration, right eye, intermediate dry stage: Secondary | ICD-10-CM | POA: Diagnosis not present

## 2023-07-01 DIAGNOSIS — H43821 Vitreomacular adhesion, right eye: Secondary | ICD-10-CM | POA: Diagnosis not present

## 2023-07-01 DIAGNOSIS — H353221 Exudative age-related macular degeneration, left eye, with active choroidal neovascularization: Secondary | ICD-10-CM | POA: Diagnosis not present

## 2023-07-05 ENCOUNTER — Telehealth (HOSPITAL_COMMUNITY): Payer: Self-pay

## 2023-07-05 NOTE — Telephone Encounter (Signed)
 Attempted to call patient in regards to pulmonary rehab. No answer, left message stating we have been trying to reach her and will be closing her referral, and for her to call us  back if she has any questions.  Initial call/mailed letter: 06/14/2023 Closed: 07/05/2023

## 2023-07-29 DIAGNOSIS — M47816 Spondylosis without myelopathy or radiculopathy, lumbar region: Secondary | ICD-10-CM | POA: Diagnosis not present

## 2023-08-05 DIAGNOSIS — Z1231 Encounter for screening mammogram for malignant neoplasm of breast: Secondary | ICD-10-CM | POA: Diagnosis not present

## 2023-08-20 DIAGNOSIS — H353221 Exudative age-related macular degeneration, left eye, with active choroidal neovascularization: Secondary | ICD-10-CM | POA: Diagnosis not present

## 2023-09-02 DIAGNOSIS — Z853 Personal history of malignant neoplasm of breast: Secondary | ICD-10-CM | POA: Diagnosis not present

## 2023-09-02 DIAGNOSIS — N952 Postmenopausal atrophic vaginitis: Secondary | ICD-10-CM | POA: Diagnosis not present

## 2023-09-02 DIAGNOSIS — M810 Age-related osteoporosis without current pathological fracture: Secondary | ICD-10-CM | POA: Diagnosis not present

## 2023-09-02 DIAGNOSIS — I10 Essential (primary) hypertension: Secondary | ICD-10-CM | POA: Diagnosis not present

## 2023-09-02 DIAGNOSIS — K52832 Lymphocytic colitis: Secondary | ICD-10-CM | POA: Diagnosis not present

## 2023-09-02 DIAGNOSIS — R413 Other amnesia: Secondary | ICD-10-CM | POA: Diagnosis not present

## 2023-09-02 DIAGNOSIS — J441 Chronic obstructive pulmonary disease with (acute) exacerbation: Secondary | ICD-10-CM | POA: Diagnosis not present

## 2023-09-02 DIAGNOSIS — M1990 Unspecified osteoarthritis, unspecified site: Secondary | ICD-10-CM | POA: Diagnosis not present

## 2023-09-02 DIAGNOSIS — N390 Urinary tract infection, site not specified: Secondary | ICD-10-CM | POA: Diagnosis not present

## 2023-09-02 DIAGNOSIS — M48061 Spinal stenosis, lumbar region without neurogenic claudication: Secondary | ICD-10-CM | POA: Diagnosis not present

## 2023-09-02 DIAGNOSIS — E785 Hyperlipidemia, unspecified: Secondary | ICD-10-CM | POA: Diagnosis not present

## 2023-09-02 DIAGNOSIS — M545 Low back pain, unspecified: Secondary | ICD-10-CM | POA: Diagnosis not present

## 2023-09-05 DIAGNOSIS — M5432 Sciatica, left side: Secondary | ICD-10-CM | POA: Diagnosis not present

## 2023-09-05 DIAGNOSIS — M25552 Pain in left hip: Secondary | ICD-10-CM | POA: Diagnosis not present

## 2023-09-05 DIAGNOSIS — M539 Dorsopathy, unspecified: Secondary | ICD-10-CM | POA: Diagnosis not present

## 2023-09-07 DIAGNOSIS — M5459 Other low back pain: Secondary | ICD-10-CM | POA: Diagnosis not present

## 2023-09-09 DIAGNOSIS — M545 Low back pain, unspecified: Secondary | ICD-10-CM | POA: Diagnosis not present

## 2023-09-09 DIAGNOSIS — G5702 Lesion of sciatic nerve, left lower limb: Secondary | ICD-10-CM | POA: Diagnosis not present

## 2023-09-11 ENCOUNTER — Telehealth (HOSPITAL_COMMUNITY): Payer: Self-pay

## 2023-09-11 NOTE — Telephone Encounter (Signed)
 Auth Submission: NO AUTH NEEDED Site of care: [facility] Payer: [employer] Medication & CPT/J Code(s) submitted: Prolia (Denosumab) [id number] Diagnosis Code: M81.0 Route of submission (phone, fax, portal):  Phone # Fax # Auth type: Buy/Bill HB Units/visits requested: 60mg  x 2 doses Reference number: [id number] (Viek C) Approval from: 09/11/23 to 09/10/24

## 2023-09-12 DIAGNOSIS — M5416 Radiculopathy, lumbar region: Secondary | ICD-10-CM | POA: Diagnosis not present

## 2023-09-12 DIAGNOSIS — M47816 Spondylosis without myelopathy or radiculopathy, lumbar region: Secondary | ICD-10-CM | POA: Diagnosis not present

## 2023-09-18 DIAGNOSIS — L97923 Non-pressure chronic ulcer of unspecified part of left lower leg with necrosis of muscle: Secondary | ICD-10-CM | POA: Diagnosis not present

## 2023-09-18 DIAGNOSIS — I10 Essential (primary) hypertension: Secondary | ICD-10-CM | POA: Diagnosis not present

## 2023-09-18 DIAGNOSIS — T148XXD Other injury of unspecified body region, subsequent encounter: Secondary | ICD-10-CM | POA: Diagnosis not present

## 2023-09-19 ENCOUNTER — Ambulatory Visit: Admitting: Orthopedic Surgery

## 2023-09-19 DIAGNOSIS — I87333 Chronic venous hypertension (idiopathic) with ulcer and inflammation of bilateral lower extremity: Secondary | ICD-10-CM

## 2023-09-24 ENCOUNTER — Telehealth: Payer: Self-pay

## 2023-09-24 ENCOUNTER — Encounter: Payer: Self-pay | Admitting: Orthopedic Surgery

## 2023-09-24 NOTE — Telephone Encounter (Signed)
 Pt's husband called to see if they were able to come in at a later time on 09/26/23. Her original appointment was at 3:45. I explained the latest they would be able to come in for an appointment would be 4:00. He said they should be there by 4:00 pm.

## 2023-09-24 NOTE — Progress Notes (Signed)
 Office Visit Note   Patient: Anna York           Date of Birth: [birth year]           MRN: [id number] Visit Date: 09/19/2023              Requested by: Shayne Anes, MD [address] [place],  [place] [address] PCP: Shayne Anes, MD  Chief Complaint  Patient presents with   Right Leg - Open Wound      HPI: Patient is a 83 year old woman who had a fall 2 weeks ago.  Patient is on blood thinners.  She has a large traumatic venous insufficiency ulcer.  Assessment & Plan: Visit Diagnoses:  1. Chronic venous hypertension (idiopathic) with ulcer and inflammation of bilateral lower extremity (HCC)     Plan: Will start Vashe dressing changes.  Patient was provided a bottle of Vashe.  Follow-Up Instructions: No follow-ups on file.   Ortho Exam  Patient is alert, oriented, no adenopathy, well-dressed, normal affect, normal respiratory effort. Examination patient has a traumatic venous ulcer that measures 4 x 7 cm there there is ischemic soft tissue flap that is currently a biologic dressing.  There is no cellulitis no drainage no signs of infection.    Imaging: No results found. No images are attached to the encounter.  Labs: Lab Results  Component Value Date   ESRSEDRATE 13 11/01/2009   LABORGA NO GROWTH 12/24/2013     Lab Results  Component Value Date   ALBUMIN 4.3 01/21/2018   ALBUMIN 4.4 09/11/2017   ALBUMIN 4.4 07/23/2017    No results found for: MG No results found for: VD25OH  No results found for: PREALBUMIN    Latest Ref Rng & Units 04/24/2023    4:13 PM 04/24/2023    4:00 PM 03/08/2023    2:06 PM  CBC EXTENDED  WBC 4.0 - 10.5 K/uL 6.5  CANCELED  7.6   RBC 3.87 - 5.11 Mil/uL 4.21   4.32   Hemoglobin 12.0 - 15.0 g/dL 86.5   86.3   HCT 63.9 - 46.0 % 40.9   42.9   Platelets 150.0 - 400.0 K/uL 301.0   295   NEUT# 1.4 - 7.7 K/uL 4.8     Lymph# 0.7 - 4.0 K/uL 0.9        There is no height or weight on file to calculate BMI.  Orders:   No orders of the defined types were placed in this encounter.  No orders of the defined types were placed in this encounter.    Procedures: No procedures performed  Clinical Data: No additional findings.  ROS:  All other systems negative, except as noted in the HPI. Review of Systems  Objective: Vital Signs: There were no vitals taken for this visit.  Specialty Comments:  No specialty comments available.  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/2022   Abnormal CT scan, lung 11/20/2021   Dyspnea on exertion 11/12/2016   Malignant neoplasm of upper-outer quadrant of right breast in female, estrogen receptor positive (HCC) 07/25/2016   Palate abnormality 05/17/2015   COPD (chronic obstructive pulmonary disease) (HCC) 10/04/2011   Atrophic vaginitis    Osteoporosis    ASCUS (atypical squamous cells of undetermined significance) on Pap smear    Cancer (HCC)    COLITIS 01/05/2010   DIARRHEA 11/01/2009   TIA 10/28/2009   CONSTIPATION 10/28/2009   CYSTITIS 10/28/2009  OSTEOARTHRITIS 10/28/2009   BACK PAIN 10/28/2009   OSTEOPENIA 10/28/2009   HEADACHE 10/28/2009   PALPITATIONS 10/28/2009   OTHER DYSPHAGIA 10/28/2009   NEOPLASM, MALIGNANT, BREAST, HX OF 10/28/2009   History of colonic polyps 10/28/2009   Past Medical History:  Diagnosis Date   ASCUS (atypical squamous cells of undetermined significance) on Pap smear    Neg high risk HPV   Asthma    Atrophic vaginitis    Backache, unspecified    Breast cancer (HCC) 1992   Chemo and Radiation   Constipation    COPD (chronic obstructive pulmonary disease) (HCC)    Cystitis    hx of frequent cystitis per pt at preop on 05/09/22.   Cystitis, unspecified    Diverticulosis    Dysphagia    Dyspnea    with exertion   GERD (gastroesophageal reflux disease)    indigestion   Heart murmur    patient denies but it is noted in several visit notes    Hypertension    Intermediate stage nonexudative age-related macular degeneration of left eye    Lymphocytic colitis    Memory loss    Mild   Osteoarthritis    Osteopenia 2012   T score -2.1   Osteoporosis    Palpitations    Personal history of colonic polyps 07/07/2002   hyperplastic   Personal history of malignant neoplasm of breast    Pneumonia    Stroke (HCC) 2010   2 TIA's    TIA (transient ischemic attack)    Unspecified transient cerebral ischemia     Family History  Problem Relation Age of Onset   Breast cancer Mother        Age 33   Hypertension Father     Past Surgical History:  Procedure Laterality Date   BREAST BIOPSY Left    biopsy   BREAST LUMPECTOMY Right    chemo and radiation   BREAST LUMPECTOMY WITH RADIOACTIVE SEED LOCALIZATION Right 08/09/2016   Procedure: RIGHT BREAST LUMPECTOMY WITH RADIOACTIVE SEED LOCALIZATION;  Surgeon: Ethyl Lenis, MD;  Location: [facility] OR;  Service: General;  Laterality: Right;   CARDIAC CATHETERIZATION     CATARACT EXTRACTION  2014   COLONOSCOPY  2011   diverticulosis   COLPOSCOPY     EYE SURGERY Bilateral    cataract extraction   FACIAL COSMETIC SURGERY     under both eyes   LYMPHADENECTOMY  1993   REVERSE SHOULDER ARTHROPLASTY Right 03/14/2023   Procedure: REVERSE SHOULDER ARTHROPLASTY;  Surgeon: Melita Drivers, MD;  Location: [facility] ORS;  Service: Orthopedics;  Laterality: Right;  120 min   TONSILLECTOMY     age 29   TOTAL KNEE ARTHROPLASTY Right 05/22/2022   Procedure: TOTAL KNEE ARTHROPLASTY;  Surgeon: Ernie Cough, MD;  Location: [facility];  Service: Orthopedics;  Laterality: Right;   Social History   Occupational History   Occupation: Veterinary surgeon    Comment: [employer]    Employer: [employer]  Tobacco Use   Smoking status: Former    Current packs/day: 0.00    Average packs/day: 1 pack/day for 30.0 years (30.0 ttl pk-yrs)    Types: Cigarettes    Start date: 02/19/1970    Quit date: 02/20/2000    Years since quitting: 23.6    Smokeless tobacco: Never  Vaping Use   Vaping status: Never Used  Substance and Sexual Activity   Alcohol  use: Yes    Alcohol /week: 14.0 standard drinks of alcohol     Types: 14 Glasses of wine  per week    Comment: 3 glsses wine daily   Drug use: No   Sexual activity: Not Currently    Birth control/protection: Post-menopausal    Comment: 1st intercourse 83 yo-Fewer than 5 partners

## 2023-09-25 DIAGNOSIS — M7138 Other bursal cyst, other site: Secondary | ICD-10-CM | POA: Diagnosis not present

## 2023-09-26 ENCOUNTER — Ambulatory Visit: Admitting: Orthopedic Surgery

## 2023-09-26 DIAGNOSIS — I87333 Chronic venous hypertension (idiopathic) with ulcer and inflammation of bilateral lower extremity: Secondary | ICD-10-CM | POA: Diagnosis not present

## 2023-09-27 ENCOUNTER — Other Ambulatory Visit: Payer: Self-pay

## 2023-09-27 ENCOUNTER — Encounter (HOSPITAL_COMMUNITY): Payer: Self-pay | Admitting: Neurosurgery

## 2023-09-27 ENCOUNTER — Other Ambulatory Visit: Payer: Self-pay | Admitting: Neurosurgery

## 2023-09-27 NOTE — Anesthesia Preprocedure Evaluation (Addendum)
 Anesthesia Evaluation  Patient identified by MRN, date of birth, ID band Patient awake    Reviewed: Allergy & Precautions, NPO status , Patient's Chart, lab work & pertinent test results, reviewed documented beta blocker date and time   History of Anesthesia Complications Negative for: history of anesthetic complications  Airway Mallampati: I  TM Distance: >3 FB Neck ROM: Full    Dental no notable dental hx.    Pulmonary shortness of breath and with exertion, asthma , neg sleep apnea, pneumonia, resolved, COPD,  COPD inhaler, former smoker, neg PE   breath sounds clear to auscultation       Cardiovascular hypertension, (-) CAD, (-) Past MI, (-) Cardiac Stents and (-) CABG + Valvular Problems/Murmurs  Rhythm:Regular Rate:Normal  Left ventricle: The cavity size was normal. Wall thickness was    normal. Systolic function was normal. The estimated ejection    fraction was in the range of 55% to 60%. Wall motion was normal;    there were no regional wall motion abnormalities. Doppler    parameters are consistent with abnormal left ventricular    relaxation (grade 1 diastolic dysfunction). GLS -20.5%.  - Aortic valve: There was no stenosis. There was mild    regurgitation.  - Mitral valve: There was mild regurgitation.  - Right ventricle: The cavity size was normal. Systolic function    was normal.  - Atrial septum: No defect or patent foramen ovale was identified.    Echo contrast study showed no right-to-left atrial level shunt,    at baseline or with provocation.  - Tricuspid valve: Peak RV-RA gradient (S): 17 mm Hg.  - Pulmonary arteries: PA peak pressure: 20 mm Hg (S).  - Inferior vena cava: The vessel was normal in size. The    respirophasic diameter changes were in the normal range (>= 50%),    consistent with normal central venous pressure.      Neuro/Psych  Headaches, neg Seizures TIACVA, No Residual Symptoms     GI/Hepatic ,GERD  ,,(+) neg Cirrhosis        Endo/Other    Renal/GU Renal disease     Musculoskeletal  (+) Arthritis , Osteoarthritis,    Abdominal   Peds  Hematology plavix    Anesthesia Other Findings   Reproductive/Obstetrics                              Anesthesia Physical Anesthesia Plan  ASA: 3  Anesthesia Plan: General   Post-op Pain Management:    Induction: Intravenous  PONV Risk Score and Plan: 2 and Ondansetron  and Dexamethasone   Airway Management Planned: Oral ETT  Additional Equipment:   Intra-op Plan:   Post-operative Plan: Extubation in OR  Informed Consent: I have reviewed the patients History and Physical, chart, labs and discussed the procedure including the risks, benefits and alternatives for the proposed anesthesia with the patient or authorized representative who has indicated his/her understanding and acceptance.     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  Last seen in follow-up by cardiothoracic surgeon Dr. Kerrin  on 06/11/2023 for monitoring of pulmonary nodules and ascending aneurysm.  Aneurysm and pulmonary nodules stable by CT chest 06/04/2023.  34-month follow-up recommended.  Follows with pulmonology for history of COPD/emphysema.  PFTs 06/13/2023 with FVC 103%, FEV1 77%, ratio 56%, TLC 108%, DLCO 42% -interpretation: Mild obstructive defect with moderately reduced gas exchange.  Last seen by Dr. Kassie on 06/13/2023.  At that time patient  reported her emphysema had worsened ever since reverse shoulder arthroplasty 03/14/2023.  Dr. Kassie commented this was likely due to deconditioning and sedentary lifestyle.  He was recommended to increase Trelegy to 200, one puff once daily.  She was recommended to continue albuterol  as needed, flutter valve as needed, Mucinex as needed.  She is currently being followed by Dr. 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  Last seen 09/19/2023, initiated on Vashe dressing changes at that time.  Patient will need day of surgery labs and evaluation.  EKG 05/09/2022: NSR.  Rate 80.  Echo bubble study 09/04/2017: - Left ventricle: The cavity size was normal. Wall thickness was  normal. Systolic function was normal. The estimated ejection  fraction was in the range of 55% to 60%. Wall motion was normal;  there were no regional wall motion abnormalities. Doppler  parameters are consistent with abnormal left ventricular  relaxation (grade 1 diastolic dysfunction). GLS -20.5%.  - Aortic valve: There was no stenosis. There was mild  regurgitation.  - Mitral valve: There was mild regurgitation.  - Right ventricle: The cavity size was normal. Systolic function  was normal.  - Atrial septum: No defect or patent foramen ovale was identified.  Echo contrast study showed no right-to-left atrial level shunt,  at baseline or with provocation.  - Tricuspid valve: Peak RV-RA gradient (S): 17 mm Hg.  - Pulmonary arteries: PA peak pressure: 20 mm Hg (S).  - Inferior vena cava: The vessel was normal in size. The  respirophasic diameter changes were in the normal range (>= 50%),  consistent with normal central venous pressure.   Impressions:   - Normal LV size with EF 55-60%. Strain as above. Normal RV size  and systolic function. Bubble study negative, no evidence for  PFO.  )         Anesthesia Quick Evaluation

## 2023-09-27 NOTE — Progress Notes (Addendum)
 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  Last seen in follow-up by cardiothoracic surgeon Dr. Kerrin on 06/11/2023 for monitoring of pulmonary nodules and ascending aneurysm.  Aneurysm and pulmonary nodules stable by CT chest 06/04/2023.  9-month follow-up recommended.  Follows with pulmonology for history of COPD/emphysema.  PFTs 06/13/2023 with FVC 103%, FEV1 77%, ratio 56%, TLC 108%, DLCO 42% -interpretation: Mild obstructive defect with moderately reduced gas exchange.  Last seen by Dr. Kassie on 06/13/2023.  At that time patient reported her emphysema had worsened ever since reverse shoulder arthroplasty 03/14/2023.  Dr. Kassie commented this was likely due to deconditioning and sedentary lifestyle.  He was recommended to increase Trelegy to 200, one puff once daily.  She was recommended to continue albuterol  as needed, flutter valve as needed, Mucinex as needed.  She is currently being followed by Dr. 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  Last seen 09/19/2023, initiated on Vashe dressing changes at that time.  Patient will need day of surgery labs and evaluation.  EKG 05/09/2022: NSR.  Rate 80.  Echo bubble study 09/04/2017: - Left ventricle: The cavity size was normal. Wall thickness was    normal. Systolic function was normal. The estimated ejection    fraction was in the range of 55% to 60%. Wall motion was normal;    there were no regional wall motion abnormalities. Doppler    parameters are consistent with abnormal left ventricular    relaxation (grade  1 diastolic dysfunction). GLS -20.5%.  - Aortic valve: There was no stenosis. There was mild    regurgitation.  - Mitral valve: There was mild regurgitation.  - Right ventricle: The cavity size was normal. Systolic function    was normal.  - Atrial septum: No defect or patent foramen ovale was identified.    Echo contrast study showed no right-to-left atrial level shunt,    at baseline or with provocation.  - Tricuspid valve: Peak RV-RA gradient (S): 17 mm Hg.  - Pulmonary arteries: PA peak pressure: 20 mm Hg (S).  - Inferior vena cava: The vessel was normal in size. The    respirophasic diameter changes were in the normal range (>= 50%),    consistent with normal central venous pressure.   Impressions:   - Normal LV size with EF 55-60%. Strain as above. Normal RV size    and systolic function. Bubble study negative, no evidence for    PFO.      Lynwood Geofm RIGGERS [facility]/Anesthesiology Phone [phone] 09/27/2023 3:01 PM

## 2023-09-27 NOTE — Progress Notes (Signed)
 PCP - Oneil Neth, MD Cardiologist - Denies  Chest x-ray - 04/24/23 EKG - DOS ECHO - 07/05/17  Blood Thinner Instructions: Plavix  last dose 09/13/23  Anesthesia review: Y  Patient verbally denies any shortness of breath, fever, cough and chest pain during phone call   -------------  SDW INSTRUCTIONS given:  Your procedure is scheduled on Monday, Aug 11th.  Report to [facility] Main Entrance A at 0530 A.M., and check in at the Admitting office.  Call this number if you have problems the morning of surgery:  [phone]   Remember:  Do not eat or drink after midnight the night before your surgery    Take these medicines the morning of surgery with A SIP OF WATER   metoprolol   TRELEGY ELLIPTA   pravastatin  rosuvastatin   albuterol  (VENTOLIN  HFA)-if needed (please bring with you) acetaminophen  (TYLENOL )-if needed   As of today, STOP taking any Aspirin  (unless otherwise instructed by your surgeon) Aleve, Naproxen, Ibuprofen, Motrin, Advil, Goody's, BC's, all herbal medications, fish oil, and all vitamins.                      Do not wear jewelry, make up, or nail polish            Do not wear lotions, powders, perfumes/colognes, or deodorant.            Do not shave 48 hours prior to surgery.  Men may shave face and neck.            Do not bring valuables to the hospital.            [facility] is not responsible for any belongings or valuables.  Do NOT Smoke (Tobacco/Vaping) 24 hours prior to your procedure If you use a CPAP at night, you may bring all equipment for your overnight stay.   Contacts, glasses, dentures or bridgework may not be worn into surgery.      For patients admitted to the hospital, discharge time will be determined by your treatment team.   Patients discharged the day of surgery will not be allowed to drive home, and someone needs to stay with them for 24 hours.    Special instructions:   [facility]- Preparing For Surgery  Before surgery, you  can play an important role. Because skin is not sterile, your skin needs to be as free of germs as possible. You can reduce the number of germs on your skin by washing with CHG (chlorahexidine gluconate) Soap before surgery.  CHG is an antiseptic cleaner which kills germs and bonds with the skin to continue killing germs even after washing.    Oral Hygiene is also important to reduce your risk of infection.  Remember - BRUSH YOUR TEETH THE MORNING OF SURGERY WITH YOUR REGULAR TOOTHPASTE  Please do not use if you have an allergy to CHG or antibacterial soaps. If your skin becomes reddened/irritated stop using the CHG.  Do not shave (including legs and underarms) for at least 48 hours prior to first CHG shower. It is OK to shave your face.  Please follow these instructions carefully.   Shower the NIGHT BEFORE SURGERY and the MORNING OF SURGERY with DIAL Soap.   Pat yourself dry with a CLEAN TOWEL.  Wear CLEAN PAJAMAS to bed the night before surgery  Place CLEAN SHEETS on your bed the night of your first shower and DO NOT SLEEP WITH PETS.   Day of Surgery: Please shower morning of surgery  Wear Clean/Comfortable clothing the morning of surgery Do not apply any deodorants/lotions.   Remember to brush your teeth WITH YOUR REGULAR TOOTHPASTE.   Questions were answered. Patient verbalized understanding of instructions.

## 2023-09-30 ENCOUNTER — Ambulatory Visit (HOSPITAL_COMMUNITY)

## 2023-09-30 ENCOUNTER — Ambulatory Visit (HOSPITAL_COMMUNITY): Payer: Self-pay | Admitting: Physician Assistant

## 2023-09-30 ENCOUNTER — Encounter (HOSPITAL_COMMUNITY)
Admission: RE | Disposition: A | Discharge status: Home / Self Care | Payer: Self-pay | Source: Home / Self Care | Attending: Neurosurgery

## 2023-09-30 ENCOUNTER — Observation Stay (HOSPITAL_COMMUNITY)
Admission: RE | Admit: 2023-09-30 | Discharge: 2023-09-30 | Disposition: A | Discharge status: Home / Self Care | Attending: Neurosurgery | Admitting: Neurosurgery

## 2023-09-30 DIAGNOSIS — Z9889 Other specified postprocedural states: Principal | ICD-10-CM | POA: Insufficient documentation

## 2023-09-30 DIAGNOSIS — M47896 Other spondylosis, lumbar region: Secondary | ICD-10-CM | POA: Insufficient documentation

## 2023-09-30 DIAGNOSIS — S34105A Unspecified injury to L5 level of lumbar spinal cord, initial encounter: Secondary | ICD-10-CM | POA: Diagnosis not present

## 2023-09-30 DIAGNOSIS — Z0189 Encounter for other specified special examinations: Secondary | ICD-10-CM | POA: Diagnosis not present

## 2023-09-30 DIAGNOSIS — Z87891 Personal history of nicotine dependence: Secondary | ICD-10-CM | POA: Diagnosis not present

## 2023-09-30 DIAGNOSIS — M7138 Other bursal cyst, other site: Secondary | ICD-10-CM | POA: Diagnosis not present

## 2023-09-30 DIAGNOSIS — F1090 Alcohol use, unspecified, uncomplicated: Secondary | ICD-10-CM | POA: Diagnosis not present

## 2023-09-30 DIAGNOSIS — I10 Essential (primary) hypertension: Secondary | ICD-10-CM | POA: Insufficient documentation

## 2023-09-30 DIAGNOSIS — J449 Chronic obstructive pulmonary disease, unspecified: Secondary | ICD-10-CM

## 2023-09-30 DIAGNOSIS — G9519 Other vascular myelopathies: Secondary | ICD-10-CM | POA: Diagnosis not present

## 2023-09-30 DIAGNOSIS — X58XXXA Exposure to other specified factors, initial encounter: Secondary | ICD-10-CM | POA: Insufficient documentation

## 2023-09-30 DIAGNOSIS — M47816 Spondylosis without myelopathy or radiculopathy, lumbar region: Secondary | ICD-10-CM | POA: Diagnosis not present

## 2023-09-30 HISTORY — PX: LUMBAR LAMINECTOMY FOR EPIDURAL ABSCESS: SHX5956

## 2023-09-30 LAB — CBC
HCT: 39.9 % (ref 36.0–46.0)
Hemoglobin: 13.1 g/dL (ref 12.0–15.0)
MCH: 32 pg (ref 26.0–34.0)
MCHC: 32.8 g/dL (ref 30.0–36.0)
MCV: 97.3 fL (ref 80.0–100.0)
Platelets: 276 K/uL (ref 150–400)
RBC: 4.1 MIL/uL (ref 3.87–5.11)
RDW: 13.9 % (ref 11.5–15.5)
WBC: 6.9 K/uL (ref 4.0–10.5)
nRBC: 0 % (ref 0.0–0.2)

## 2023-09-30 LAB — COMPREHENSIVE METABOLIC PANEL WITH GFR
ALT: 12 U/L (ref 0–44)
AST: 19 U/L (ref 15–41)
Albumin: 3.7 g/dL (ref 3.5–5.0)
Alkaline Phosphatase: 56 U/L (ref 38–126)
Anion gap: 10 (ref 5–15)
BUN: 9 mg/dL (ref 8–23)
CO2: 23 mmol/L (ref 22–32)
Calcium: 9.1 mg/dL (ref 8.9–10.3)
Chloride: 104 mmol/L (ref 98–111)
Creatinine, Ser: 0.71 mg/dL (ref 0.44–1.00)
GFR, Estimated: >60 mL/min (ref >60)
Glucose, Bld: 108 mg/dL — ABNORMAL HIGH (ref 70–99)
Potassium: 4 mmol/L (ref 3.5–5.1)
Sodium: 137 mmol/L (ref 135–145)
Total Bilirubin: 0.7 mg/dL (ref 0.0–1.2)
Total Protein: 6.3 g/dL — ABNORMAL LOW (ref 6.5–8.1)

## 2023-09-30 LAB — SURGICAL PCR SCREEN
MRSA, PCR: NEGATIVE
Staphylococcus aureus: NEGATIVE

## 2023-09-30 SURGERY — LUMBAR LAMINECTOMY FOR EPIDURAL ABSCESS
Anesthesia: General | Site: Spine Lumbar | Laterality: Left

## 2023-09-30 MED ORDER — 0.9 % SODIUM CHLORIDE (POUR BTL) OPTIME
TOPICAL | Status: DC | PRN
  Administered 2023-09-30 (×2): 1000 mL

## 2023-09-30 MED ORDER — CHLORHEXIDINE GLUCONATE CLOTH 2 % EX PADS: 6.0000 | MEDICATED_PAD | Freq: Once | CUTANEOUS | Status: DC

## 2023-09-30 MED ORDER — BUPIVACAINE HCL 0.5 % IJ SOLN
INTRAMUSCULAR | Status: DC | PRN
  Administered 2023-09-30 (×2): 5 mL

## 2023-09-30 MED ORDER — FENTANYL CITRATE (PF) 250 MCG/5ML IJ SOLN
INTRAMUSCULAR | Status: DC | PRN
  Administered 2023-09-30 (×2): 100 ug via INTRAVENOUS

## 2023-09-30 MED ORDER — DEXAMETHASONE SODIUM PHOSPHATE 10 MG/ML IJ SOLN
INTRAMUSCULAR | Status: DC | PRN
  Administered 2023-09-30 (×2): 4 mg via INTRAVENOUS

## 2023-09-30 MED ORDER — PROPOFOL 10 MG/ML IV BOLUS
INTRAVENOUS | Status: AC
  Filled 2023-09-30: qty 20

## 2023-09-30 MED ORDER — OXYCODONE-ACETAMINOPHEN 5-325 MG PO TABS
1.0000 | ORAL_TABLET | Freq: Four times a day (QID) | ORAL | 0 refills | Status: AC | PRN
Start: 2023-09-30 — End: 2023-10-07

## 2023-09-30 MED ORDER — PRAVASTATIN SODIUM 10 MG PO TABS: 20.0000 mg | ORAL_TABLET | Freq: Every morning | ORAL | Status: DC

## 2023-09-30 MED ORDER — SODIUM CHLORIDE 0.9% FLUSH
3.0000 mL | Freq: Two times a day (BID) | INTRAVENOUS | Status: DC
  Administered 2023-09-30 (×2): 3 mL via INTRAVENOUS

## 2023-09-30 MED ORDER — THROMBIN 5000 UNITS EX KIT
PACK | CUTANEOUS | Status: AC
Start: 2023-09-30 — End: 2023-09-30
  Filled 2023-09-30: qty 1

## 2023-09-30 MED ORDER — ACETAMINOPHEN 325 MG PO TABS: 650.0000 mg | ORAL_TABLET | ORAL | Status: DC | PRN

## 2023-09-30 MED ORDER — LIDOCAINE-EPINEPHRINE 1 %-1:100000 IJ SOLN
INTRAMUSCULAR | Status: AC
  Filled 2023-09-30: qty 1

## 2023-09-30 MED ORDER — METOPROLOL SUCCINATE ER 25 MG PO TB24
25.0000 mg | ORAL_TABLET | Freq: Every day | ORAL | Status: DC
Start: 2023-09-30 — End: 2023-09-30
  Administered 2023-09-30 (×2): 25 mg via ORAL
  Filled 2023-09-30: qty 1

## 2023-09-30 MED ORDER — SUGAMMADEX SODIUM 200 MG/2ML IV SOLN
INTRAVENOUS | Status: DC | PRN
  Administered 2023-09-30 (×2): 200 mg via INTRAVENOUS

## 2023-09-30 MED ORDER — ONDANSETRON HCL 4 MG/2ML IJ SOLN: 4.0000 mg | Freq: Once | INTRAMUSCULAR | Status: DC | PRN

## 2023-09-30 MED ORDER — FENTANYL CITRATE (PF) 250 MCG/5ML IJ SOLN
INTRAMUSCULAR | Status: AC
  Filled 2023-09-30: qty 5

## 2023-09-30 MED ORDER — HYDROCODONE-ACETAMINOPHEN 5-325 MG PO TABS
1.0000 | ORAL_TABLET | ORAL | Status: DC | PRN
  Filled 2023-09-30: qty 1

## 2023-09-30 MED ORDER — CYCLOBENZAPRINE HCL 10 MG PO TABS: 10.0000 mg | ORAL_TABLET | Freq: Three times a day (TID) | ORAL | 0 refills | Status: AC | PRN

## 2023-09-30 MED ORDER — THROMBIN 5000 UNITS EX SOLR
OROMUCOSAL | Status: DC | PRN
  Administered 2023-09-30 (×2): 5 mL via TOPICAL

## 2023-09-30 MED ORDER — PHENOL 1.4 % MT LIQD: 1.0000 | OROMUCOSAL | Status: DC | PRN

## 2023-09-30 MED ORDER — LIDOCAINE-EPINEPHRINE 1 %-1:100000 IJ SOLN
INTRAMUSCULAR | Status: DC | PRN
Start: 2023-09-30 — End: 2023-09-30
  Administered 2023-09-30 (×2): 5 mL

## 2023-09-30 MED ORDER — METOCLOPRAMIDE HCL 5 MG/ML IJ SOLN
10.0000 mg | Freq: Once | INTRAMUSCULAR | Status: AC
  Administered 2023-09-30 (×2): 10 mg via INTRAVENOUS

## 2023-09-30 MED ORDER — BUPIVACAINE HCL (PF) 0.5 % IJ SOLN
INTRAMUSCULAR | Status: AC
  Filled 2023-09-30: qty 30

## 2023-09-30 MED ORDER — ONDANSETRON HCL 4 MG/2ML IJ SOLN
INTRAMUSCULAR | Status: DC | PRN
  Administered 2023-09-30 (×2): 4 mg via INTRAVENOUS

## 2023-09-30 MED ORDER — ACETAMINOPHEN 500 MG PO TABS
1000.0000 mg | ORAL_TABLET | Freq: Four times a day (QID) | ORAL | Status: DC
  Administered 2023-09-30 (×2): 1000 mg via ORAL
  Filled 2023-09-30: qty 2

## 2023-09-30 MED ORDER — PROPOFOL 10 MG/ML IV BOLUS
INTRAVENOUS | Status: DC | PRN
  Administered 2023-09-30 (×2): 90 mg via INTRAVENOUS

## 2023-09-30 MED ORDER — ONDANSETRON HCL 4 MG/2ML IJ SOLN: 4.0000 mg | Freq: Four times a day (QID) | INTRAMUSCULAR | Status: DC | PRN

## 2023-09-30 MED ORDER — ALBUTEROL SULFATE (2.5 MG/3ML) 0.083% IN NEBU: 3.0000 mL | INHALATION_SOLUTION | Freq: Four times a day (QID) | RESPIRATORY_TRACT | Status: DC | PRN

## 2023-09-30 MED ORDER — METHOCARBAMOL 500 MG PO TABS
500.0000 mg | ORAL_TABLET | Freq: Four times a day (QID) | ORAL | Status: DC | PRN
Start: 2023-09-30 — End: 2023-09-30

## 2023-09-30 MED ORDER — SODIUM CHLORIDE 0.9% FLUSH
3.0000 mL | INTRAVENOUS | Status: DC | PRN
Start: 2023-09-30 — End: 2023-09-30

## 2023-09-30 MED ORDER — ROCURONIUM BROMIDE 10 MG/ML (PF) SYRINGE
PREFILLED_SYRINGE | INTRAVENOUS | Status: DC | PRN
  Administered 2023-09-30: 20 mg via INTRAVENOUS
  Administered 2023-09-30 (×2): 50 mg via INTRAVENOUS
  Administered 2023-09-30: 20 mg via INTRAVENOUS

## 2023-09-30 MED ORDER — CEFAZOLIN SODIUM-DEXTROSE 2-4 GM/100ML-% IV SOLN: 2.0000 g | Freq: Three times a day (TID) | INTRAVENOUS | Status: DC

## 2023-09-30 MED ORDER — LACTATED RINGERS IV SOLN: INTRAVENOUS | Status: DC

## 2023-09-30 MED ORDER — ACETAMINOPHEN 650 MG RE SUPP: 650.0000 mg | RECTAL | Status: DC | PRN

## 2023-09-30 MED ORDER — CEFAZOLIN SODIUM-DEXTROSE 2-4 GM/100ML-% IV SOLN
2.0000 g | INTRAVENOUS | Status: AC
  Administered 2023-09-30 (×2): 2 g via INTRAVENOUS
  Filled 2023-09-30: qty 100

## 2023-09-30 MED ORDER — PHENYLEPHRINE 80 MCG/ML (10ML) SYRINGE FOR IV PUSH (FOR BLOOD PRESSURE SUPPORT)
PREFILLED_SYRINGE | INTRAVENOUS | Status: DC | PRN
  Administered 2023-09-30: 80 ug via INTRAVENOUS
  Administered 2023-09-30: 160 ug via INTRAVENOUS
  Administered 2023-09-30: 80 ug via INTRAVENOUS
  Administered 2023-09-30 (×2): 160 ug via INTRAVENOUS
  Administered 2023-09-30: 80 ug via INTRAVENOUS
  Administered 2023-09-30: 160 ug via INTRAVENOUS
  Administered 2023-09-30: 80 ug via INTRAVENOUS
  Administered 2023-09-30 (×4): 160 ug via INTRAVENOUS

## 2023-09-30 MED ORDER — OXYCODONE HCL 5 MG PO TABS: 5.0000 mg | ORAL_TABLET | Freq: Once | ORAL | Status: DC | PRN

## 2023-09-30 MED ORDER — CHLORHEXIDINE GLUCONATE 0.12 % MT SOLN: 15.0000 mL | Freq: Once | OROMUCOSAL | Status: AC

## 2023-09-30 MED ORDER — LACTATED RINGERS IV SOLN
INTRAVENOUS | Status: DC | PRN
Start: 2023-09-30 — End: 2023-09-30

## 2023-09-30 MED ORDER — OXYCODONE HCL 5 MG/5ML PO SOLN: 5.0000 mg | Freq: Once | ORAL | Status: DC | PRN

## 2023-09-30 MED ORDER — ONDANSETRON HCL 4 MG PO TABS: 4.0000 mg | ORAL_TABLET | Freq: Four times a day (QID) | ORAL | Status: DC | PRN

## 2023-09-30 MED ORDER — ORAL CARE MOUTH RINSE: 15.0000 mL | Freq: Once | OROMUCOSAL | Status: AC

## 2023-09-30 MED ORDER — SODIUM CHLORIDE 0.9 % IV SOLN: INTRAVENOUS | Status: DC

## 2023-09-30 MED ORDER — MENTHOL 3 MG MT LOZG: 1.0000 | LOZENGE | OROMUCOSAL | Status: DC | PRN

## 2023-09-30 MED ORDER — ROSUVASTATIN CALCIUM 5 MG PO TABS
5.0000 mg | ORAL_TABLET | Freq: Every day | ORAL | Status: DC
Start: 2023-09-30 — End: 2023-09-30

## 2023-09-30 MED ORDER — METHYLPREDNISOLONE ACETATE 80 MG/ML IJ SUSP
INTRAMUSCULAR | Status: DC | PRN
Start: 2023-09-30 — End: 2023-09-30
  Administered 2023-09-30 (×2): 80 mg

## 2023-09-30 MED ORDER — ACETAMINOPHEN 10 MG/ML IV SOLN: 1000.0000 mg | Freq: Once | INTRAVENOUS | Status: DC | PRN

## 2023-09-30 MED ORDER — CHLORHEXIDINE GLUCONATE 0.12 % MT SOLN
OROMUCOSAL | Status: AC
  Administered 2023-09-30 (×2): 15 mL via OROMUCOSAL
  Filled 2023-09-30: qty 15

## 2023-09-30 MED ORDER — METHOCARBAMOL 1000 MG/10ML IJ SOLN
500.0000 mg | Freq: Four times a day (QID) | INTRAMUSCULAR | Status: DC | PRN
Start: 2023-09-30 — End: 2023-09-30

## 2023-09-30 MED ORDER — METHYLPREDNISOLONE ACETATE 80 MG/ML IJ SUSP
INTRAMUSCULAR | Status: AC
  Filled 2023-09-30: qty 1

## 2023-09-30 MED ORDER — METOCLOPRAMIDE HCL 5 MG/ML IJ SOLN
INTRAMUSCULAR | Status: AC
  Filled 2023-09-30: qty 2

## 2023-09-30 MED ORDER — LIDOCAINE 2% (20 MG/ML) 5 ML SYRINGE
INTRAMUSCULAR | Status: DC | PRN
  Administered 2023-09-30 (×2): 100 mg via INTRAVENOUS

## 2023-09-30 MED ORDER — SODIUM CHLORIDE 0.9 % IV SOLN: 250.0000 mL | INTRAVENOUS | Status: DC

## 2023-09-30 MED ORDER — FENTANYL CITRATE (PF) 100 MCG/2ML IJ SOLN: 25.0000 ug | INTRAMUSCULAR | Status: DC | PRN

## 2023-09-30 SURGICAL SUPPLY — 32 items
BAG COUNTER SPONGE SURGICOUNT (BAG) ×1 IMPLANT
BUR MATCHSTICK NEURO 3.0 LAGG (BURR) IMPLANT
CANISTER SUCTION 3000ML PPV (SUCTIONS) ×1 IMPLANT
DERMABOND ADVANCED .7 DNX12 (GAUZE/BANDAGES/DRESSINGS) IMPLANT
DRAPE LAPAROTOMY 100X72X124 (DRAPES) ×1 IMPLANT
DRAPE MICROSCOPE LEICA (MISCELLANEOUS) IMPLANT
DRSG OPSITE POSTOP 4X6 (GAUZE/BANDAGES/DRESSINGS) IMPLANT
ELECTRODE REM PT RTRN 9FT ADLT (ELECTROSURGICAL) ×1 IMPLANT
GAUZE 4X4 16PLY ~~LOC~~+RFID DBL (SPONGE) IMPLANT
GAUZE SPONGE 4X4 12PLY STRL (GAUZE/BANDAGES/DRESSINGS) IMPLANT
GLOVE BIOGEL PI IND STRL 7.5 (GLOVE) ×1 IMPLANT
GLOVE ECLIPSE 7.0 STRL STRAW (GLOVE) ×1 IMPLANT
GLOVE EXAM NITRILE XL STR (GLOVE) IMPLANT
GOWN STRL REUS W/ TWL LRG LVL3 (GOWN DISPOSABLE) ×1 IMPLANT
GOWN STRL REUS W/ TWL XL LVL3 (GOWN DISPOSABLE) IMPLANT
GOWN STRL REUS W/TWL 2XL LVL3 (GOWN DISPOSABLE) IMPLANT
HEMOSTAT POWDER KIT SURGIFOAM (HEMOSTASIS) IMPLANT
KIT BASIN OR (CUSTOM PROCEDURE TRAY) ×1 IMPLANT
KIT TURNOVER KIT B (KITS) ×1 IMPLANT
NDL HYPO 22X1.5 SAFETY MO (MISCELLANEOUS) ×1 IMPLANT
NEEDLE HYPO 22X1.5 SAFETY MO (MISCELLANEOUS) ×1 IMPLANT
NS IRRIG 1000ML POUR BTL (IV SOLUTION) ×1 IMPLANT
PACK LAMINECTOMY NEURO (CUSTOM PROCEDURE TRAY) ×1 IMPLANT
PAD ARMBOARD POSITIONER FOAM (MISCELLANEOUS) ×3 IMPLANT
SPONGE SURGIFOAM ABS GEL SZ50 (HEMOSTASIS) IMPLANT
SUT VIC AB 0 CT1 18XCR BRD8 (SUTURE) ×1 IMPLANT
SUT VICRYL 3-0 RB1 18 ABS (SUTURE) ×2 IMPLANT
SWAB COLLECTION DEVICE MRSA (MISCELLANEOUS) IMPLANT
SWAB CULTURE ESWAB REG 1ML (MISCELLANEOUS) IMPLANT
TOWEL GREEN STERILE (TOWEL DISPOSABLE) ×1 IMPLANT
TOWEL GREEN STERILE FF (TOWEL DISPOSABLE) ×1 IMPLANT
WATER STERILE IRR 1000ML POUR (IV SOLUTION) ×1 IMPLANT

## 2023-09-30 NOTE — Anesthesia Postprocedure Evaluation (Signed)
 Anesthesia Post Note  Patient: Anna York  Procedure(s) Performed: LUMBAR LAMINECTOMY FOR FACET/SYNOVIAL CYST LEFT LUMBAR FOUR-FIVE (Left: Spine Lumbar)     Patient location during evaluation: PACU Anesthesia Type: General Level of consciousness: awake and alert Pain management: pain level controlled Vital Signs Assessment: post-procedure vital signs reviewed and stable Respiratory status: spontaneous breathing, nonlabored ventilation, respiratory function stable and patient connected to nasal cannula oxygen Cardiovascular status: blood pressure returned to baseline and stable Postop Assessment: no apparent nausea or vomiting Anesthetic complications: no   No notable events documented.  Last Vitals:  Vitals:   09/30/23 1030 09/30/23 1121  BP: (!) 146/80 (!) 169/83  Pulse: 82 92  Resp: (!) 21 20  Temp: (!) 36.4 C 37.1 C  SpO2: 94% 94%    Last Pain:  Vitals:   09/30/23 1000  TempSrc:   PainSc: 2                  Lynwood MARLA Cornea

## 2023-09-30 NOTE — Progress Notes (Signed)
 Dr. Lanis notified pt is unsure of last dose of Plavix . Pt states she has been taking her plavix , husband reminded her she was supposed to stop a week ago. She stated she did stop it and told me she took it the day before yesterday. Husband believes she stopped it 10 days ago.

## 2023-09-30 NOTE — Transfer of Care (Signed)
 Immediate Anesthesia Transfer of Care Note  Patient: Anna York  Procedure(s) Performed: LUMBAR LAMINECTOMY FOR FACET/SYNOVIAL CYST LEFT LUMBAR FOUR-FIVE (Left: Spine Lumbar)  Patient Location: PACU  Anesthesia Type:General  Level of Consciousness: awake, alert , and oriented  Airway & Oxygen Therapy: Patient Spontanous Breathing and Patient connected to nasal cannula oxygen  Post-op Assessment: Report given to RN and Post -op Vital signs reviewed and stable  Post vital signs: Reviewed and stable  Last Vitals:  Vitals Value Taken Time  BP 163/80 09/30/23 09:30  Temp 36.4 C 09/30/23 09:30  Pulse 74 09/30/23 09:37  Resp 12 09/30/23 09:37  SpO2 98 % 09/30/23 09:37  Vitals shown include unfiled device data.  Last Pain:  Vitals:   09/30/23 0930  TempSrc:   PainSc: 7          Complications: No notable events documented.

## 2023-09-30 NOTE — H&P (Signed)
 Chief Complaint   Back and leg pain, left foot weakness  History of Present Illness  Anna York is a 83 year old woman seen in follow-up. She was last seen about two months ago with primarily back pain. Unfortunately in the interim, about three weeks ago she noted rather sudden onset of severe intermittent left greater than right leg pain, and associated left foot weakness. She says she initially went to [facility] where a repeat MRI revealed a cyst in her back. She apparently underwent attempt at cyst aspiration by Dr. Bonner with minimal fluid aspirated. Patient reports minimal improvement in symptoms since then. She comes in today without any improvement in symptoms, continuing to report relatively chronic back pain, but intermittent bouts of shooting pain to the posterior aspect of the left greater than right leg. She continues to report severe left foot weakness, with inability to pick her toes or foot up. She has not noted any changes in bladder function. No right leg or foot weakness.   Of note, the patient reports a history of hypertension. No history of diabetes. She reports a previous  stroke. She is maintained on Plavix  which she confidently reports stopping about 10d ago. No known lung, liver, or kidney disease. She has a history of osteoporosis. She is a nonsmoker.   Past Medical History   Past Medical History:  Diagnosis Date   ASCUS (atypical squamous cells of undetermined significance) on Pap smear    Neg high risk HPV   Asthma    Atrophic vaginitis    Backache, unspecified    Breast cancer (HCC) 1992   Chemo and Radiation   Constipation    COPD (chronic obstructive pulmonary disease) (HCC)    Cystitis    hx of frequent cystitis per pt at preop on 05/09/22.   Cystitis, unspecified    Diverticulosis    Dysphagia    Dyspnea    with exertion   GERD (gastroesophageal reflux disease)    indigestion   Heart murmur    patient denies but it is noted in several visit  notes   Hypertension    Intermediate stage nonexudative age-related macular degeneration of left eye    Lymphocytic colitis    Memory loss    Mild   Osteoarthritis    Osteopenia 2012   T score -2.1   Osteoporosis    Palpitations    Personal history of colonic polyps 07/07/2002   hyperplastic   Personal history of malignant neoplasm of breast    Pneumonia    Stroke (HCC) 2010   2 TIA's    TIA (transient ischemic attack)    Unspecified transient cerebral ischemia     Past Surgical History   Past Surgical History:  Procedure Laterality Date   BREAST BIOPSY Left    biopsy   BREAST LUMPECTOMY Right    chemo and radiation   BREAST LUMPECTOMY WITH RADIOACTIVE SEED LOCALIZATION Right 08/09/2016   Procedure: RIGHT BREAST LUMPECTOMY WITH RADIOACTIVE SEED LOCALIZATION;  Surgeon: Ethyl Lenis, MD;  Location: [facility] OR;  Service: General;  Laterality: Right;   CARDIAC CATHETERIZATION     CATARACT EXTRACTION  2014   COLONOSCOPY  2011   diverticulosis   COLPOSCOPY     EYE SURGERY Bilateral    cataract extraction   FACIAL COSMETIC SURGERY     under both eyes   LYMPHADENECTOMY  1993   REVERSE SHOULDER ARTHROPLASTY Right 03/14/2023   Procedure: REVERSE SHOULDER ARTHROPLASTY;  Surgeon: Melita Drivers, MD;  Location: [facility] ORS;  Service: Orthopedics;  Laterality: Right;  120 min   TONSILLECTOMY     age 41   TOTAL KNEE ARTHROPLASTY Right 05/22/2022   Procedure: TOTAL KNEE ARTHROPLASTY;  Surgeon: Ernie Cough, MD;  Location: [facility] OR[facility];  Service: Orthopedics;  Laterality: Right;    Social History   Social History   Tobacco Use   Smoking status: Former    Current packs/day: 0.00    Average packs/day: 1 pack/day for 30.0 years (30.0 ttl pk-yrs)    Types: Cigarettes    Start date: 02/19/1970    Quit date: 02/20/2000    Years since quitting: 23.6   Smokeless tobacco: Never  Vaping Use   Vaping status: Never Used  Substance Use Topics   Alcohol  use: Yes    Alcohol /week: 14.0 standard drinks of  alcohol     Types: 14 Glasses of wine per week    Comment: 3 glsses wine daily   Drug use: No    Medications   Prior to Admission medications   Medication Sig Start Date End Date Taking? Authorizing Provider  acetaminophen  (TYLENOL ) 500 MG tablet Take 500-1,000 mg by mouth every 6 (six) hours as needed for headache (pain.).   Yes [provider]  albuterol  (VENTOLIN  HFA) 108 (90 Base) MCG/ACT inhaler Inhale 2 puffs into the lungs every 6 (six) hours as needed. 01/22/23  Yes Kassie Acquanetta Bradley, MD  clopidogrel  (PLAVIX ) 75 MG tablet Take 75 mg by mouth in the morning.   Yes [provider]  Fluticasone -Umeclidin-Vilant (TRELEGY ELLIPTA ) 100-62.5-25 MCG/ACT AEPB INHALE ONE PUFF INTO THE LUNGS DAILY 01/22/23  Yes Kassie Acquanetta Bradley, MD  ibandronate (BONIVA) 150 MG tablet Take 150 mg by mouth every 30 (thirty) days. Take in the morning with a full glass of water , on an empty stomach, and do not take anything else by mouth or lie down for the next 30 min.   Yes [provider]  metoprolol  succinate (TOPROL -XL) 25 MG 24 hr tablet Take 25 mg by mouth in the morning.   Yes [provider]  pravastatin  (PRAVACHOL ) 20 MG tablet Take 20 mg by mouth in the morning.   Yes [provider]  rosuvastatin  (CRESTOR ) 5 MG tablet Take 5 mg by mouth in the morning. 06/19/21  Yes [provider]  Fluticasone -Umeclidin-Vilant (TRELEGY ELLIPTA ) 200-62.5-25 MCG/ACT AEPB Inhale 1 puff into the lungs daily. Patient not taking: Reported on 09/27/2023 06/13/23   Kassie Acquanetta Bradley, MD    Allergies   Allergies  Allergen Reactions   Erythromycin Nausea And Vomiting   Macrobid [Nitrofurantoin] Hives and Rash    Review of Systems  ROS  Neurologic Exam  Awake, alert, oriented Memory and concentration grossly intact Speech fluent, appropriate CN grossly intact Motor exam: Upper Extremities Deltoid Bicep Tricep Grip  Right 5/5 5/5 5/5 5/5  Left 5/5 5/5 5/5 5/5   Lower  Extremities IP Quad PF DF EHL  Right 5/5 5/5 5/5 5/5 5/5  Left 5/5 5/5 5/5 5/5 5/5   Sensation grossly intact to LT  Imaging  MRI reveals multilevel spondylosis and 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 female presenting with development of L4-5 cyst and associated back and leg pain with left foot drop  Plan  - Will proceed with L4-5 laminotomy for decompression of thecal sac, resection of cyst  I have reviewed the indications for the procedure as well as the details of the procedure  and the expected postoperative course and recovery at length with the patient and her husband in the office. We have also reviewed in detail the risks, benefits, and alternatives to the procedure. All questions were answered and Rollene SHAUNNA Satterfield provided informed consent to proceed.  Gerldine Maizes, MD [facility]

## 2023-09-30 NOTE — Anesthesia Procedure Notes (Signed)
 Procedure Name: Intubation Date/Time: 09/30/2023 8:01 AM  Performed by: Lockie Flesher, CRNAPre-anesthesia Checklist: Patient identified, Emergency Drugs available, Suction available and Patient being monitored Patient Re-evaluated:Patient Re-evaluated prior to induction Oxygen Delivery Method: Circle System Utilized Preoxygenation: Pre-oxygenation with 100% oxygen Induction Type: IV induction Ventilation: Mask ventilation without difficulty Laryngoscope Size: Mac and 3 Grade View: Grade II Tube type: Oral Tube size: 7.0 mm Number of attempts: 1 Airway Equipment and Method: Stylet and Oral airway Placement Confirmation: ETT inserted through vocal cords under direct vision, positive ETCO2 and breath sounds checked- equal and bilateral Secured at: 22 cm Tube secured with: Tape Dental Injury: Teeth and Oropharynx as per pre-operative assessment

## 2023-09-30 NOTE — Evaluation (Signed)
 Occupational Therapy Evaluation Patient Details Name: Anna York MRN: [id number] DOB: 02-18-1941 Today's Date: 09/30/2023   History of Present Illness   Pt is an 83 y.o. female s/p laminectomy for facet/synovial cyst L 4-5. PMH significant for asthma, breast cancer, COPD, diverticulosis, dysphagia, gyspnea, GERD, HTN, macular degeneration L eye, memory loss, osteoarthritis, stroke, R reverse TSA in January.     Clinical Impressions PTA, pt lived with husband who has recently been assisting with stairs and intermittently with LB ADL. Upon eval, pt performing UB ADL with set-up and LB ADL with up to mod A. Pt educated and demonstrating use of compensatory techniques for bed mobility, LB ADL, grooming, toileting, shower transfers, stair training within precautions. Needing up to min A for stair training due to mild LLE buckling. Reviewed recommendations for frequent mobility s/p surgery.  All education provided and questions answered. Recommending discharge home with pt and husband denying need for continued OT services after discharge.      If plan is discharge home, recommend the following:   A little help with walking and/or transfers;A little help with bathing/dressing/bathroom;Assistance with cooking/housework;Assist for transportation;Help with stairs or ramp for entrance     Functional Status Assessment   Patient has had a recent decline in their functional status and demonstrates the ability to make significant improvements in function in a reasonable and predictable amount of time.     Equipment Recommendations   None recommended by OT     Recommendations for Other Services   PT consult     Precautions/Restrictions   Precautions Precautions: Back Precaution Booklet Issued: Yes (comment) Recall of Precautions/Restrictions: Intact Precaution/Restrictions Comments: able to recall after 3x rehearsal Restrictions Weight Bearing Restrictions Per Provider Order:  No     Mobility Bed Mobility Overal bed mobility: Needs Assistance Bed Mobility: Rolling, Sidelying to Sit Rolling: Min assist Sidelying to sit: Min assist       General bed mobility comments: pt able to come to EOB without assist but with poor self-implementation of log roll technique    Transfers Overall transfer level: Needs assistance Equipment used: Rolling walker (2 wheels) Transfers: Sit to/from Stand Sit to Stand: Min assist, Contact guard assist           General transfer comment: min A progressing to CGA for with cues for safety and hand placement      Balance Overall balance assessment: Needs assistance Sitting-balance support: No upper extremity supported, Feet supported Sitting balance-Leahy Scale: Good     Standing balance support: Bilateral upper extremity supported, During functional activity Standing balance-Leahy Scale: Poor Standing balance comment: reliant on device                           ADL either performed or assessed with clinical judgement   ADL Overall ADL's : Needs assistance/impaired Eating/Feeding: Set up;Sitting   Grooming: Contact guard assist;Standing   Upper Body Bathing: Set up;Sitting   Lower Body Bathing: Minimal assistance;Sit to/from stand   Upper Body Dressing : Set up;Sitting   Lower Body Dressing: Moderate assistance;Sit to/from stand Lower Body Dressing Details (indicate cue type and reason): likley min A but pt husband asssisting Toilet Transfer: Contact guard assist;Ambulation;Rolling walker (2 wheels)     Toileting - Clothing Manipulation Details (indicate cue type and reason): reviewed compensatory techniques for pericare     Functional mobility during ADLs: Contact guard assist;Rolling walker (2 wheels)       Vision Baseline Vision/History: 6  Macular Degeneration (L eye) Ability to See in Adequate Light: 1 Impaired Patient Visual Report: No change from baseline Vision Assessment?: No  apparent visual deficits;Wears glasses for reading (not formally assessed)     Perception Perception: Not tested       Praxis Praxis: Not tested       Pertinent Vitals/Pain Pain Assessment Pain Assessment: Faces Faces Pain Scale: Hurts little more Pain Location: back, L hip Pain Descriptors / Indicators: Aching, Sore, Guarding Pain Intervention(s): Limited activity within patient's tolerance, Monitored during session     Extremity/Trunk Assessment Upper Extremity Assessment Upper Extremity Assessment: Generalized weakness (not formally assessed. uses BUE functionally during session; TSA in January)   Lower Extremity Assessment Lower Extremity Assessment: Defer to PT evaluation;RLE deficits/detail;LLE deficits/detail RLE Deficits / Details: mild buckling with progression of LLE down stairs LLE Deficits / Details: generally weak, decr sensation below knee level   Cervical / Trunk Assessment Cervical / Trunk Assessment: Back Surgery   Communication Communication Communication: No apparent difficulties   Cognition Arousal: Alert Behavior During Therapy: WFL for tasks assessed/performed Cognition: Cognition impaired       Memory impairment (select all impairments): Short-term memory     OT - Cognition Comments: cues for implementation of spinal precautions                 Following commands: Intact (internally distracted at times needing additional cues to attend to instruction)       Cueing  General Comments   Cueing Techniques: Verbal cues;Gestural cues  engaged in stair training with pt needing CGA for going up stairs, but min A to progress back down stairs.   Exercises     Shoulder Instructions      Home Living Family/patient expects to be discharged to:: Private residence Living Arrangements: Spouse/significant other Available Help at Discharge: Family Type of Home: House Home Access: Stairs to enter [employer] of Steps: 2 Entrance  Stairs-Rails: Right Home Layout: Two level;Able to live on main level with bedroom/bathroom     Bathroom Shower/Tub: Producer, television/film/video: Handicapped height Bathroom Accessibility: Yes How Accessible: Accessible via wheelchair Home Equipment: Pharmacist, hospital (2 wheels);BSC/3in1;Cane - single point          Prior Functioning/Environment Prior Level of Function : Independent/Modified Independent             Mobility Comments: using walker for the past ~1 month ADLs Comments: independent with ADL until recently, husband intermittently assist with LB ADL and stairs    OT Problem List: Decreased strength;Decreased activity tolerance;Impaired balance (sitting and/or standing);Decreased safety awareness;Decreased knowledge of use of DME or AE;Decreased knowledge of precautions;Pain   OT Treatment/Interventions: Self-care/ADL training;Therapeutic exercise;DME and/or AE instruction;Therapeutic activities;Patient/family education;Balance training      OT Goals(Current goals can be found in the care plan section)   Acute Rehab OT Goals Patient Stated Goal: get better OT Goal Formulation: With patient Time For Goal Achievement: 10/14/23 Potential to Achieve Goals: Good   OT Frequency:  Min 2X/week    Co-evaluation              AM-PAC OT 6 Clicks Daily Activity     Outcome Measure Help from another person eating meals?: None Help from another person taking care of personal grooming?: A Little Help from another person toileting, which includes using toliet, bedpan, or urinal?: A Little Help from another person bathing (including washing, rinsing, drying)?: A Little Help from another person to put on and taking  off regular upper body clothing?: A Little Help from another person to put on and taking off regular lower body clothing?: A Lot 6 Click Score: 18   End of Session Equipment Utilized During Treatment: Gait belt;Rolling walker (2  wheels) Nurse Communication: Mobility status  Activity Tolerance: Patient tolerated treatment well Patient left: Other (comment);with nursing/sitter in room;with family/visitor present (EOB)  OT Visit Diagnosis: Unsteadiness on feet (R26.81);Muscle weakness (generalized) (M62.81)                Time: 8671-8644 OT Time Calculation (min): 27 min Charges:  OT General Charges $OT Visit: 1 Visit OT Evaluation $OT Eval Low Complexity: 1 Low OT Treatments $Self Care/Home Management : 8-22 mins  Elma JONETTA Lebron FREDERICK, OTR/L [facility] Office: [phone]   Elma JONETTA Lebron 09/30/2023, 2:17 PM

## 2023-09-30 NOTE — Discharge Summary (Signed)
 Physician Discharge Summary  Patient ID: Anna York MRN: [id number] DOB/AGE: 1940/04/05 83 y.o.  Admit date: 09/30/2023 Discharge date: 09/30/2023  Admission Diagnoses:  Spinal syovial cyst  Discharge Diagnoses:  Lumbar spinal epidural hematoma Active Problems:   * No active hospital problems. *   Discharged Condition: Stable  Hospital Course:  Anna York is a 83 y.o. female who underwent uncomplicated L4-5 decompression, with intraopeartive findings c/w chronic epidural hematoma. She was at baseline postop and discharged from PACU in stable condition  Treatments: Surgery - L4-5 sublaminar decompression, evacuation of epidural hematoma  Discharge Exam: Blood pressure (!) (P) 144/67, pulse (P) 86, temperature (P) 98 F (36.7 C), temperature source (P) Oral, resp. rate (P) 18, height (P) 5' 4 (1.626 m), weight (P) 50.8 kg, SpO2 (P) 93%. Awake, alert, oriented Speech fluent, appropriate CN grossly intact 5/5 BUE/BLE x 1/5 left DF/EHL Wound c/d/i  Disposition: Discharge disposition: 01-Home or Self Care       Discharge Instructions     Call MD for:  redness, tenderness, or signs of infection (pain, swelling, redness, odor or green/yellow discharge around incision site)   Complete by: As directed    Call MD for:  temperature >100.4   Complete by: As directed    Diet - low sodium heart healthy   Complete by: As directed    Discharge instructions   Complete by: As directed    Walk at home as much as possible, at least 4 times / day   Increase activity slowly   Complete by: As directed    Lifting restrictions   Complete by: As directed    No lifting > 10 lbs   May shower / Bathe   Complete by: As directed    48 hours after surgery   May walk up steps   Complete by: As directed    Other Restrictions   Complete by: As directed    No bending/twisting at waist   Remove dressing in 48 hours   Complete by: As directed       Allergies as of  09/30/2023       Reactions   Erythromycin Nausea And Vomiting   Macrobid [nitrofurantoin] Hives, Rash        Medication List     PAUSE taking these medications    clopidogrel  75 MG tablet Wait to take this until: October 05, 2023 Commonly known as: PLAVIX  Take 75 mg by mouth in the morning.       TAKE these medications    acetaminophen  500 MG tablet Commonly known as: TYLENOL  Take 500-1,000 mg by mouth every 6 (six) hours as needed for headache (pain.).   albuterol  108 (90 Base) MCG/ACT inhaler Commonly known as: VENTOLIN  HFA Inhale 2 puffs into the lungs every 6 (six) hours as needed.   cyclobenzaprine  10 MG tablet Commonly known as: FLEXERIL  Take 1 tablet (10 mg total) by mouth 3 (three) times daily as needed for muscle spasms.   ibandronate 150 MG tablet Commonly known as: BONIVA Take 150 mg by mouth every 30 (thirty) days. Take in the morning with a full glass of water , on an empty stomach, and do not take anything else by mouth or lie down for the next 30 min.   metoprolol  succinate 25 MG 24 hr tablet Commonly known as: TOPROL -XL Take 25 mg by mouth in the morning.   oxyCODONE -acetaminophen  5-325 MG tablet Commonly known as: Percocet Take 1 tablet by mouth every 6 (six) hours as needed  for up to 7 days for severe pain (pain score 7-10).   pravastatin  20 MG tablet Commonly known as: PRAVACHOL  Take 20 mg by mouth in the morning.   rosuvastatin  5 MG tablet Commonly known as: CRESTOR  Take 5 mg by mouth in the morning.   Trelegy Ellipta  100-62.5-25 MCG/ACT Aepb Generic drug: Fluticasone -Umeclidin-Vilant INHALE ONE PUFF INTO THE LUNGS DAILY   Trelegy Ellipta  200-62.5-25 MCG/ACT Aepb Generic drug: Fluticasone -Umeclidin-Vilant Inhale 1 puff into the lungs daily.        Follow-up Information     Lanis Pupa, MD Follow up in 3 week(s).   Specialty: Neurosurgery Contact information: [address] [place]  [address] [phone]                 Signed: Pupa JAYSON Lanis 09/30/2023, 9:23 AM

## 2023-09-30 NOTE — Progress Notes (Signed)
 Patient alert and oriented, void, ambulate. Surgical site clean and dry no sign of infection. D/c instructions explain and givento the patient and husband, all questions answered. Patient d/c home per order.

## 2023-10-01 ENCOUNTER — Other Ambulatory Visit: Payer: Self-pay

## 2023-10-01 ENCOUNTER — Encounter (HOSPITAL_COMMUNITY): Payer: Self-pay | Admitting: Neurosurgery

## 2023-10-01 NOTE — Progress Notes (Signed)
 Office Visit Note   Patient: Anna York           Date of Birth: 04-11-1940           MRN: [id number] Visit Date: 09/26/2023              Requested by: Shayne Anes, MD [address] [place],  [place] [address] PCP: Shayne Anes, MD  Chief Complaint  Patient presents with   Right Leg - Follow-up      HPI: Patient is an 83 year old woman who presents in follow-up for traumatic wound right leg.  Patient has been using Vashe dressing changes.  She is not on antibiotics.  Assessment & Plan: Visit Diagnoses:  1. Chronic venous hypertension (idiopathic) with ulcer and inflammation of bilateral lower extremity (HCC)     Plan: Donated Kerecis applied to the wound.  They will change the outside dressing and leave the Adaptic in place.  Follow-Up Instructions: Return in about 1 week (around 10/03/2023).   Ortho Exam  Patient is alert, oriented, no adenopathy, well-dressed, normal affect, normal respiratory effort. Examination of the soft tissue flap has necrosis this was debrided.  There is approximately 75% healthy granulation tissue in the wound bed.  This was further cleansed with Vashe and donated Kerecis tissue graft applied to the wound.    Imaging: No results found.   Labs: Lab Results  Component Value Date   ESRSEDRATE 13 11/01/2009   LABORGA NO GROWTH 12/24/2013     Lab Results  Component Value Date   ALBUMIN 3.7 09/30/2023   ALBUMIN 4.3 01/21/2018   ALBUMIN 4.4 09/11/2017    No results found for: MG No results found for: VD25OH  No results found for: PREALBUMIN    Latest Ref Rng & Units 09/30/2023    6:35 AM 04/24/2023    4:13 PM 04/24/2023    4:00 PM  CBC EXTENDED  WBC 4.0 - 10.5 K/uL 6.9  6.5  CANCELED   RBC 3.87 - 5.11 MIL/uL 4.10  4.21    Hemoglobin 12.0 - 15.0 g/dL 86.8  86.5    HCT 63.9 - 46.0 % 39.9  40.9    Platelets 150 - 400 K/uL 276  301.0    NEUT# 1.4 - 7.7 K/uL  4.8    Lymph# 0.7 - 4.0 K/uL  0.9       There is no height  or weight on file to calculate BMI.  Orders:  No orders of the defined types were placed in this encounter.  No orders of the defined types were placed in this encounter.    Procedures: No procedures performed  Clinical Data: No additional findings.  ROS:  All other systems negative, except as noted in the HPI. Review of Systems  Objective: Vital Signs: There were no vitals taken for this visit.  Specialty Comments:  No specialty comments available.  PMFS History: Patient Active Problem List   Diagnosis Date Noted   S/P laminectomy 09/30/2023   S/P lumbar laminectomy 09/30/2023   S/P total knee arthroplasty, right 05/22/2022   Abnormal CT scan, lung 11/20/2021   Dyspnea on exertion 11/12/2016   Malignant neoplasm of upper-outer quadrant of right breast in female, estrogen receptor positive (HCC) 07/25/2016   Palate abnormality 05/17/2015   COPD (chronic obstructive pulmonary disease) (HCC) 10/04/2011   Atrophic vaginitis    Osteoporosis    ASCUS (atypical squamous cells of undetermined significance) on Pap smear    Cancer (HCC)    COLITIS 01/05/2010  DIARRHEA 11/01/2009   TIA 10/28/2009   CONSTIPATION 10/28/2009   CYSTITIS 10/28/2009   OSTEOARTHRITIS 10/28/2009   BACK PAIN 10/28/2009   OSTEOPENIA 10/28/2009   HEADACHE 10/28/2009   PALPITATIONS 10/28/2009   OTHER DYSPHAGIA 10/28/2009   NEOPLASM, MALIGNANT, BREAST, HX OF 10/28/2009   History of colonic polyps 10/28/2009   Past Medical History:  Diagnosis Date   ASCUS (atypical squamous cells of undetermined significance) on Pap smear    Neg high risk HPV   Asthma    Atrophic vaginitis    Backache, unspecified    Breast cancer (HCC) 1992   Chemo and Radiation   Constipation    COPD (chronic obstructive pulmonary disease) (HCC)    Cystitis    hx of frequent cystitis per pt at preop on 05/09/22.   Cystitis, unspecified    Diverticulosis    Dysphagia    Dyspnea    with exertion   GERD  (gastroesophageal reflux disease)    indigestion   Heart murmur    patient denies but it is noted in several visit notes   Hypertension    Intermediate stage nonexudative age-related macular degeneration of left eye    Lymphocytic colitis    Memory loss    Mild   Osteoarthritis    Osteopenia 2012   T score -2.1   Osteoporosis    Palpitations    Personal history of colonic polyps 07/07/2002   hyperplastic   Personal history of malignant neoplasm of breast    Pneumonia    Stroke (HCC) 2010   2 TIA's    TIA (transient ischemic attack)    Unspecified transient cerebral ischemia     Family History  Problem Relation Age of Onset   Breast cancer Mother        Age 66   Hypertension Father     Past Surgical History:  Procedure Laterality Date   BREAST BIOPSY Left    biopsy   BREAST LUMPECTOMY Right    chemo and radiation   BREAST LUMPECTOMY WITH RADIOACTIVE SEED LOCALIZATION Right 08/09/2016   Procedure: RIGHT BREAST LUMPECTOMY WITH RADIOACTIVE SEED LOCALIZATION;  Surgeon: Ethyl Lenis, MD;  [facility] - [facility];  Service: General;  Laterality: Right;   CARDIAC CATHETERIZATION     CATARACT EXTRACTION  2014   COLONOSCOPY  2011   diverticulosis   COLPOSCOPY     EYE SURGERY Bilateral    cataract extraction   FACIAL COSMETIC SURGERY     under both eyes   LUMBAR LAMINECTOMY FOR EPIDURAL ABSCESS Left 09/30/2023   Procedure: LUMBAR LAMINECTOMY FOR FACET/SYNOVIAL CYST LEFT LUMBAR FOUR-FIVE;  Surgeon: Lanis Pupa, MD;  Location: [facility] OR;  Service: Neurosurgery;  Laterality: Left;   LYMPHADENECTOMY  1993   REVERSE SHOULDER ARTHROPLASTY Right 03/14/2023   Procedure: REVERSE SHOULDER ARTHROPLASTY;  Surgeon: Melita Drivers, MD;  Location: [facility] ORS;  Service: Orthopedics;  Laterality: Right;  120 min   TONSILLECTOMY     age 55   TOTAL KNEE ARTHROPLASTY Right 05/22/2022   Procedure: TOTAL KNEE ARTHROPLASTY;  Surgeon: Ernie Cough, MD;  Location: [facility] ORS;  Service: Orthopedics;  Laterality:  Right;   Social History   Occupational History   Occupation: Veterinary surgeon    Comment: [employer]    Employer: [employer]  Tobacco Use   Smoking status: Former    Current packs/day: 0.00    Average packs/day: 1 pack/day for 30.0 years (30.0 ttl pk-yrs)    Types: Cigarettes    Start date: 02/19/1970  Quit date: 02/20/2000    Years since quitting: 23.6   Smokeless tobacco: Never  Vaping Use   Vaping status: Never Used  Substance and Sexual Activity   Alcohol  use: Yes    Alcohol /week: 14.0 standard drinks of alcohol     Types: 14 Glasses of wine per week    Comment: 3 glsses wine daily   Drug use: No   Sexual activity: Not Currently    Birth control/protection: Post-menopausal    Comment: 1st intercourse 83 yo-Fewer than 5 partners

## 2023-10-03 NOTE — Op Note (Signed)
 NEUROSURGERY OPERATIVE NOTE   PREOP DIAGNOSIS:  Lumbar synovial cyst, L4-5 Lumbar Spondylosis, L4-5  POSTOP DIAGNOSIS:  Lumbar epidural hematoma, L4-5 Lumbar spondylosis, L4-5  PROCEDURE: 1. Sublaminar decompression, L4-5 for decompression of thecal sac, evacuation of epidural hematoma 2. Use of operating microscope  SURGEON: Dr. Gerldine Maizes, MD  ASSISTANT: Camie Pickle, PA-C  ANESTHESIA: General Endotracheal  EBL: Minimal  SPECIMENS: None  DRAINS: None  COMPLICATIONS: None immediate  CONDITION: Hemodynamically stable to PACU  HISTORY: Anna York is a 83 y.o. female who initially presented to the outpatient neurosurgery clinic complaining of back and left greater than right leg pain, with associated left-sided foot drop over the course of about 3 weeks.  She had MRI demonstrating a dorsally located cyst eccentric to the left at the level of L4-5 with associated stenosis.  Given her neurologic and radiographic findings, surgical decompression was indicated.  The risks, benefits, and alternatives to surgery were all reviewed in detail with the patient and her family.  After all questions were answered informed consent was obtained and witnessed.  PROCEDURE IN DETAIL: After informed consent was obtained and witnessed, the patient was brought to the operating room. After induction of general anesthesia, the patient was positioned on the operative table in the prone position with all pressure points meticulously padded. The skin of the low back was then prepped and draped in the usual sterile fashion.  Under fluoroscopy, the correct level was identified and marked out on the skin, and after timeout was conducted, the skin was infiltrated with local anesthetic. Skin incision was then made sharply and Bovie electrocautery was used to dissect the subcutaneous tissue until the lumbodorsal fascia was identified. The fascia was then incised using Bovie electrocautery and  the lamina at the L4 and L5 levels on the left was identified and dissection was carried out in the subperiosteal plane. Self-retaining retractor was then placed, and intraoperative x-ray was taken to confirm we were at the correct level.  Using a high-speed drill, laminotomy was completed with a partial medial facetectomy. The ligamentum flavum was then identified and removed.  Immediately subjacent to the ligamentum flavum, I identified what appeared initially to be the dura, although significantly purple in color.  I carefully dissected around what appeared to be the thecal sac, including removal of the medial aspect of the left L4-5 facet complex, and extending the laminotomy slightly superiorly and inferiorly.  I then utilized the high-speed drill to remove the undersurface of the contralateral right sided L4 lamina.  I developed a plane underneath the ligamentum flavum and using Kerrison punches, the contralateral sublaminar decompression was completed.  I did note a significantly spondylotic facet complex on the right at L4-5 which was again removed using a combination of high-speed drill and Kerrison punches.  In this manner, I was able to identify the lateral aspect of the thecal sac on the contralateral side and fully decompress the lateral recess on the right.  At this point, a small ball dissector was used to identify the ventral epidural space.  I was then able to identify a plane between what appeared to be a chronic hematoma on the dorsal surface of the thecal sac.  I was then able to enter the chronic hematoma membrane, and expressed a significant amount of chronic epidural hematoma which was removed with blunt dissection.  I was then able to remove the chronic hematoma membrane.  Once this was done, the thecal sac appeared to be fully decompressed from lateral recess  to lateral recess.  At this point the field was irrigated with normal saline.  Hemostasis was easily secured using a combination  of bipolar electrocautery and morselized Gelfoam and thrombin . Thecal sac was then covered with a long-acting steroid solution. Self-retaining retractor was then removed, and the wound is closed in layers using a combination of interrupted 0 Vicryl and 3-0 Vicryl stitches. The skin was closed using standard skin glue.  At the end of the case all sponge, needle, and instrument counts were correct. The patient was then transferred to the stretcher and taken to the postanesthesia care unit in stable hemodynamic condition.   Gerldine Maizes, MD [facility]

## 2023-10-15 ENCOUNTER — Ambulatory Visit: Admitting: Orthopedic Surgery

## 2023-10-15 ENCOUNTER — Encounter: Payer: Self-pay | Admitting: Orthopedic Surgery

## 2023-10-15 DIAGNOSIS — I87333 Chronic venous hypertension (idiopathic) with ulcer and inflammation of bilateral lower extremity: Secondary | ICD-10-CM

## 2023-10-15 NOTE — Progress Notes (Signed)
 Office Visit Note   Patient: Anna York           Date of Birth: 1940-10-29           MRN: [id number] Visit Date: 10/15/2023              Requested by: Shayne Anes, MD [address] [place],  [place] [address] PCP: Shayne Anes, MD  Chief Complaint  Patient presents with   Right Leg - Follow-up      HPI: Discussed the use of AI scribe software for clinical note transcription with the patient, who gave verbal consent to proceed.  History of Present Illness DAJANE VALLI is an 83 year old female who presents for wound care management.  She has a wound measuring one centimeter by two centimeters. She has been using a wound ointment called Edrick and finds it effective. She is interested in purchasing more of this ointment.  She has a history of varicose veins, which contributes to swelling in her legs. She is considering using a medicated sock that contains nano silver and nano copper, which is antibacterial and stimulates epithelialization.     Assessment & Plan: Visit Diagnoses:  1. Chronic venous hypertension (idiopathic) with ulcer and inflammation of bilateral lower extremity (HCC)     Plan: Assessment and Plan Assessment & Plan Chronic lower extremity wound Wound measures 1 cm by 2 cm with healthy granulation and peripheral epithelialization. No cellulitis. Scab expected to fall off naturally.  - Instructed to wash wound with soap and water . - Apply blue bottle solution on 4x4 gauze. - Avoid alcohol  or peroxide on wound. - Keep wound covered with gauze and Ace Wrap 24 hours until washing. - Allow gauze to pull off dead tissue when removed. - Consider medicated sock with nano silver and copper for epithelialization and antibacterial effects. - Follow up in two weeks for wound assessment and possible debridement.  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 Compression therapy advised. Medicated socks recommended  for antibacterial and anti-inflammatory effects. - Recommend knee-high compression socks to reduce swelling. - Suggest wearing medicated sock full-time or part-time if full-time not feasible.  Follow-Up Follow-up necessary to monitor wound healing and perform interventions. - Schedule follow-up appointment in two weeks.      Follow-Up Instructions: Return in about 2 weeks (around 10/29/2023).   Ortho Exam  Patient is alert, oriented, no adenopathy, well-dressed, normal affect, normal respiratory effort. Physical Exam EXTREMITIES: Calf circumference 31 cm. SKIN: Wound with healthy tissue, 1x2 cm, no cellulitis.      Imaging: No results found.   Labs: Lab Results  Component Value Date   ESRSEDRATE 13 11/01/2009   LABORGA NO GROWTH 12/24/2013     Lab Results  Component Value Date   ALBUMIN 3.7 09/30/2023   ALBUMIN 4.3 01/21/2018   ALBUMIN 4.4 09/11/2017    No results found for: MG No results found for: VD25OH  No results found for: PREALBUMIN    Latest Ref Rng & Units 09/30/2023    6:35 AM 04/24/2023    4:13 PM 04/24/2023    4:00 PM  CBC EXTENDED  WBC 4.0 - 10.5 K/uL 6.9  6.5  CANCELED   RBC 3.87 - 5.11 MIL/uL 4.10  4.21    Hemoglobin 12.0 - 15.0 g/dL 86.8  86.5    HCT 63.9 - 46.0 % 39.9  40.9    Platelets 150 - 400 K/uL 276  301.0    NEUT#  1.4 - 7.7 K/uL  4.8    Lymph# 0.7 - 4.0 K/uL  0.9       There is no height or weight on file to calculate BMI.  Orders:  No orders of the defined types were placed in this encounter.  No orders of the defined types were placed in this encounter.    Procedures: No procedures performed  Clinical Data: No additional findings.  ROS:  All other systems negative, except as noted in the HPI. Review of Systems  Objective: Vital Signs: There were no vitals taken for this visit.  Specialty Comments:  No specialty comments available.  PMFS History: Patient Active Problem List   Diagnosis Date Noted   S/P  laminectomy 09/30/2023   S/P lumbar laminectomy 09/30/2023   S/P total knee arthroplasty, right 05/22/2022   Abnormal CT scan, lung 11/20/2021   Dyspnea on exertion 11/12/2016   Malignant neoplasm of upper-outer quadrant of right breast in female, estrogen receptor positive (HCC) 07/25/2016   Palate abnormality 05/17/2015   COPD (chronic obstructive pulmonary disease) (HCC) 10/04/2011   Atrophic vaginitis    Osteoporosis    ASCUS (atypical squamous cells of undetermined significance) on Pap smear    Cancer (HCC)    COLITIS 01/05/2010   DIARRHEA 11/01/2009   TIA 10/28/2009   CONSTIPATION 10/28/2009   CYSTITIS 10/28/2009   OSTEOARTHRITIS 10/28/2009   BACK PAIN 10/28/2009   OSTEOPENIA 10/28/2009   HEADACHE 10/28/2009   PALPITATIONS 10/28/2009   OTHER DYSPHAGIA 10/28/2009   NEOPLASM, MALIGNANT, BREAST, HX OF 10/28/2009   History of colonic polyps 10/28/2009   Past Medical History:  Diagnosis Date   ASCUS (atypical squamous cells of undetermined significance) on Pap smear    Neg high risk HPV   Asthma    Atrophic vaginitis    Backache, unspecified    Breast cancer (HCC) 1992   Chemo and Radiation   Constipation    COPD (chronic obstructive pulmonary disease) (HCC)    Cystitis    hx of frequent cystitis per pt at preop on 05/09/22.   Cystitis, unspecified    Diverticulosis    Dysphagia    Dyspnea    with exertion   GERD (gastroesophageal reflux disease)    indigestion   Heart murmur    patient denies but it is noted in several visit notes   Hypertension    Intermediate stage nonexudative age-related macular degeneration of left eye    Lymphocytic colitis    Memory loss    Mild   Osteoarthritis    Osteopenia 2012   T score -2.1   Osteoporosis    Palpitations    Personal history of colonic polyps 07/07/2002   hyperplastic   Personal history of malignant neoplasm of breast    Pneumonia    Stroke (HCC) 2010   2 TIA's    TIA (transient ischemic attack)     Unspecified transient cerebral ischemia     Family History  Problem Relation Age of Onset   Breast cancer Mother        Age 79   Hypertension Father     Past Surgical History:  Procedure Laterality Date   BREAST BIOPSY Left    biopsy   BREAST LUMPECTOMY Right    chemo and radiation   BREAST LUMPECTOMY WITH RADIOACTIVE SEED LOCALIZATION Right 08/09/2016   Procedure: RIGHT BREAST LUMPECTOMY WITH RADIOACTIVE SEED LOCALIZATION;  Surgeon: Ethyl Lenis, MD;  Location: [facility] OR;  Service: General;  Laterality: Right;   CARDIAC CATHETERIZATION  CATARACT EXTRACTION  2014   COLONOSCOPY  2011   diverticulosis   COLPOSCOPY     EYE SURGERY Bilateral    cataract extraction   FACIAL COSMETIC SURGERY     under both eyes   LUMBAR LAMINECTOMY FOR EPIDURAL ABSCESS Left 09/30/2023   Procedure: LUMBAR LAMINECTOMY FOR FACET/SYNOVIAL CYST LEFT LUMBAR FOUR-FIVE;  Surgeon: Lanis Pupa, MD;  Location: [facility] OR;  Service: Neurosurgery;  Laterality: Left;   LYMPHADENECTOMY  1993   REVERSE SHOULDER ARTHROPLASTY Right 03/14/2023   Procedure: REVERSE SHOULDER ARTHROPLASTY;  Surgeon: Melita Drivers, MD;  Location: [facility] ORS;  Service: Orthopedics;  Laterality: Right;  120 min   TONSILLECTOMY     age 52   TOTAL KNEE ARTHROPLASTY Right 05/22/2022   Procedure: TOTAL KNEE ARTHROPLASTY;  Surgeon: Ernie Cough, MD;  Location: [facility] ORS;  Service: Orthopedics;  Laterality: Right;   Social History   Occupational History   Occupation: Veterinary surgeon    Comment: [employer]    Employer: [employer]  Tobacco Use   Smoking status: Former    Current packs/day: 0.00    Average packs/day: 1 pack/day for 30.0 years (30.0 ttl pk-yrs)    Types: Cigarettes    Start date: 02/19/1970    Quit date: 02/20/2000    Years since quitting: 23.6   Smokeless tobacco: Never  Vaping Use   Vaping status: Never Used  Substance and Sexual Activity   Alcohol  use: Yes    Alcohol /week: 14.0 standard drinks of alcohol     Types: 14 Glasses of wine  per week    Comment: 3 glsses wine daily   Drug use: No   Sexual activity: Not Currently    Birth control/protection: Post-menopausal    Comment: 1st intercourse 83 yo-Fewer than 5 partners

## 2023-10-29 ENCOUNTER — Other Ambulatory Visit (HOSPITAL_COMMUNITY): Payer: Self-pay | Admitting: Internal Medicine

## 2023-10-29 DIAGNOSIS — M810 Age-related osteoporosis without current pathological fracture: Secondary | ICD-10-CM | POA: Insufficient documentation

## 2023-10-31 ENCOUNTER — Ambulatory Visit (INDEPENDENT_AMBULATORY_CARE_PROVIDER_SITE_OTHER): Admitting: Orthopedic Surgery

## 2023-10-31 DIAGNOSIS — I87333 Chronic venous hypertension (idiopathic) with ulcer and inflammation of bilateral lower extremity: Secondary | ICD-10-CM | POA: Diagnosis not present

## 2023-11-04 DIAGNOSIS — H43821 Vitreomacular adhesion, right eye: Secondary | ICD-10-CM | POA: Diagnosis not present

## 2023-11-04 DIAGNOSIS — H35033 Hypertensive retinopathy, bilateral: Secondary | ICD-10-CM | POA: Diagnosis not present

## 2023-11-04 DIAGNOSIS — H43812 Vitreous degeneration, left eye: Secondary | ICD-10-CM | POA: Diagnosis not present

## 2023-11-04 DIAGNOSIS — H353231 Exudative age-related macular degeneration, bilateral, with active choroidal neovascularization: Secondary | ICD-10-CM | POA: Diagnosis not present

## 2023-11-04 DIAGNOSIS — Z961 Presence of intraocular lens: Secondary | ICD-10-CM | POA: Diagnosis not present

## 2023-11-04 DIAGNOSIS — H35373 Puckering of macula, bilateral: Secondary | ICD-10-CM | POA: Diagnosis not present

## 2023-11-05 ENCOUNTER — Encounter: Payer: Self-pay | Admitting: Orthopedic Surgery

## 2023-11-05 NOTE — Progress Notes (Signed)
 Office Visit Note   Patient: Anna York           Date of Birth: 17-Mar-1940           MRN: [id number] Visit Date: 10/31/2023              Requested by: Shayne Anes, MD [address] [place],  [place] [address] PCP: Shayne Anes, MD  Chief Complaint  Patient presents with   Left Leg - Wound Check      HPI: Discussed the use of AI scribe software for clinical note transcription with the patient, who gave verbal consent to proceed.  History of Present Illness Anna York is an 83 year old female who presents with leg swelling and ulceration.  She experiences persistent swelling in her leg, primarily attributed to varicose veins. The swelling involves the entire leg but is not as pronounced currently.  She is managing a wound on her leg, which has shown improvement recently. Two days ago, after dressing the wound for 24 hours, it appeared dry with no swelling or redness. However, the following day, there was minimal sticking in one area, and it was not as dry. The wound is currently one centimeter by two centimeters. She uses a solution around the wound to manage superficial bacteria and is using an Ace Wrap and gauze for wound care.  Her foot has not healed from a previous surgery, remaining numb and causing difficulty in wearing regular shoes for extended periods. The numbness has persisted for six months, although it has improved slightly over time.     Assessment & Plan: Visit Diagnoses:  1. Chronic venous hypertension (idiopathic) with ulcer and inflammation of bilateral lower extremity (HCC)     Plan: Assessment and Plan Assessment & Plan Venous stasis ulcer of lower extremity due to varicose veins Chronic ulcer improving with treatment. Reduced erythema and swelling. Ulcer size 1 cm by 2 cm. Healing expected in two months. Varicose veins contribute to delayed healing. - Continue blue bottle solution and gauze for wound care. - Use Ace Wrap for  compression. - Encourage increased activity to promote circulation and reduce swelling. - Avoid sitting with legs hanging down. - Follow-up in four weeks to assess healing.      Follow-Up Instructions: Return in about 2 weeks (around 11/14/2023).   Ortho Exam  Patient is alert, oriented, no adenopathy, well-dressed, normal affect, normal respiratory effort. Physical Exam   Examination there is pitting edema and swelling of the left lower extremity.  The venous ulcer measures 1 x 2 cm with flat granulation tissue.  We discussed options for compression elevation and exercise.  There is no redness no cellulitis no signs of infection.    Imaging: No results found. No images are attached to the encounter.  Labs: Lab Results  Component Value Date   ESRSEDRATE 13 11/01/2009   LABORGA NO GROWTH 12/24/2013     Lab Results  Component Value Date   ALBUMIN 3.7 09/30/2023   ALBUMIN 4.3 01/21/2018   ALBUMIN 4.4 09/11/2017    No results found for: MG No results found for: VD25OH  No results found for: PREALBUMIN    Latest Ref Rng & Units 09/30/2023    6:35 AM 04/24/2023    4:13 PM 04/24/2023    4:00 PM  CBC EXTENDED  WBC 4.0 - 10.5 K/uL 6.9  6.5  CANCELED   RBC 3.87 - 5.11 MIL/uL 4.10  4.21    Hemoglobin 12.0 - 15.0 g/dL 13.1  13.4    HCT 36.0 - 46.0 % 39.9  40.9    Platelets 150 - 400 K/uL 276  301.0    NEUT# 1.4 - 7.7 K/uL  4.8    Lymph# 0.7 - 4.0 K/uL  0.9       There is no height or weight on file to calculate BMI.  Orders:  No orders of the defined types were placed in this encounter.  No orders of the defined types were placed in this encounter.    Procedures: No procedures performed  Clinical Data: No additional findings.  ROS:  All other systems negative, except as noted in the HPI. Review of Systems  Objective: Vital Signs: There were no vitals taken for this visit.  Specialty Comments:  No specialty comments available.  PMFS  History: 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   S/P laminectomy 09/30/2023   S/P lumbar laminectomy 09/30/2023   S/P total knee arthroplasty, right 05/22/2022   Abnormal CT scan, lung 11/20/2021   Dyspnea on exertion 11/12/2016   Malignant neoplasm of upper-outer quadrant of right breast in female, estrogen receptor positive (HCC) 07/25/2016   Palate abnormality 05/17/2015   COPD (chronic obstructive pulmonary disease) (HCC) 10/04/2011   Atrophic vaginitis    Osteoporosis    ASCUS (atypical squamous cells of undetermined significance) on Pap smear    Cancer (HCC)    COLITIS 01/05/2010   DIARRHEA 11/01/2009   TIA 10/28/2009   CONSTIPATION 10/28/2009   CYSTITIS 10/28/2009   OSTEOARTHRITIS 10/28/2009   BACK PAIN 10/28/2009   OSTEOPENIA 10/28/2009   HEADACHE 10/28/2009   PALPITATIONS 10/28/2009   OTHER DYSPHAGIA 10/28/2009   NEOPLASM, MALIGNANT, BREAST, HX OF 10/28/2009   History of colonic polyps 10/28/2009   Past Medical History:  Diagnosis Date   ASCUS (atypical squamous cells of undetermined significance) on Pap smear    Neg high risk HPV   Asthma    Atrophic vaginitis    Backache, unspecified    Breast cancer (HCC) 1992   Chemo and Radiation   Constipation    COPD (chronic obstructive pulmonary disease) (HCC)    Cystitis    hx of frequent cystitis per pt at preop on 05/09/22.   Cystitis, unspecified    Diverticulosis    Dysphagia    Dyspnea    with exertion   GERD (gastroesophageal reflux disease)    indigestion   Heart murmur    patient denies but it is noted in several visit notes   Hypertension    Intermediate stage nonexudative age-related macular degeneration of left eye    Lymphocytic colitis    Memory loss    Mild   Osteoarthritis    Osteopenia 2012   T score -2.1   Osteoporosis    Palpitations    Personal history of colonic polyps 07/07/2002   hyperplastic   Personal history of malignant  neoplasm of breast    Pneumonia    Stroke (HCC) 2010   2 TIA's    TIA (transient ischemic attack)    Unspecified transient cerebral ischemia     Family History  Problem Relation Age of Onset   Breast cancer Mother        Age 48   Hypertension Father     Past Surgical History:  Procedure Laterality Date   BREAST BIOPSY Left    biopsy   BREAST LUMPECTOMY Right    chemo and radiation   BREAST LUMPECTOMY WITH RADIOACTIVE SEED  LOCALIZATION Right 08/09/2016   Procedure: RIGHT BREAST LUMPECTOMY WITH RADIOACTIVE SEED LOCALIZATION;  Surgeon: Ethyl Lenis, MD;  Location: [facility] OR;  Service: General;  Laterality: Right;   CARDIAC CATHETERIZATION     CATARACT EXTRACTION  2014   COLONOSCOPY  2011   diverticulosis   COLPOSCOPY     EYE SURGERY Bilateral    cataract extraction   FACIAL COSMETIC SURGERY     under both eyes   LUMBAR LAMINECTOMY FOR EPIDURAL ABSCESS Left 09/30/2023   Procedure: LUMBAR LAMINECTOMY FOR FACET/SYNOVIAL CYST LEFT LUMBAR FOUR-FIVE;  Surgeon: Lanis Pupa, MD;  Location: [facility] OR;  Service: Neurosurgery;  Laterality: Left;   LYMPHADENECTOMY  1993   REVERSE SHOULDER ARTHROPLASTY Right 03/14/2023   Procedure: REVERSE SHOULDER ARTHROPLASTY;  Surgeon: Melita Drivers, MD;  Location: [facility];  Service: Orthopedics;  Laterality: Right;  120 min   TONSILLECTOMY     age 78   TOTAL KNEE ARTHROPLASTY Right 05/22/2022   Procedure: TOTAL KNEE ARTHROPLASTY;  Surgeon: Ernie Cough, MD;  Location: [facility] OR[facility];  Service: Orthopedics;  Laterality: Right;   Social History   Occupational History   Occupation: Veterinary surgeon    Comment: [employer]    Employer: [employer]  Tobacco Use   Smoking status: Former    Current packs/day: 0.00    Average packs/day: 1 pack/day for 30.0 years (30.0 ttl pk-yrs)    Types: Cigarettes    Start date: 02/19/1970    Quit date: 02/20/2000    Years since quitting: 23.7   Smokeless tobacco: Never  Vaping Use   Vaping status: Never Used  Substance and Sexual  Activity   Alcohol  use: Yes    Alcohol /week: 14.0 standard drinks of alcohol     Types: 14 Glasses of wine per week    Comment: 3 glsses wine daily   Drug use: No   Sexual activity: Not Currently    Birth control/protection: Post-menopausal    Comment: 1st intercourse 83 yo-Fewer than 5 partners

## 2023-11-08 ENCOUNTER — Ambulatory Visit (HOSPITAL_COMMUNITY)
Admission: RE | Admit: 2023-11-08 | Discharge: 2023-11-08 | Disposition: A | Discharge status: Home / Self Care | Source: Ambulatory Visit | Attending: Internal Medicine | Admitting: Internal Medicine

## 2023-11-08 DIAGNOSIS — M810 Age-related osteoporosis without current pathological fracture: Secondary | ICD-10-CM | POA: Insufficient documentation

## 2023-11-08 MED ORDER — DENOSUMAB 60 MG/ML ~~LOC~~ SOSY
60.0000 mg | PREFILLED_SYRINGE | Freq: Once | SUBCUTANEOUS | Status: AC
  Administered 2023-11-08: 60 mg via SUBCUTANEOUS

## 2023-11-08 MED ORDER — DENOSUMAB 60 MG/ML ~~LOC~~ SOSY
PREFILLED_SYRINGE | SUBCUTANEOUS | Status: AC
  Filled 2023-11-08: qty 1

## 2023-11-11 DIAGNOSIS — M545 Low back pain, unspecified: Secondary | ICD-10-CM | POA: Diagnosis not present

## 2023-11-20 DIAGNOSIS — N390 Urinary tract infection, site not specified: Secondary | ICD-10-CM | POA: Diagnosis not present

## 2023-11-23 DIAGNOSIS — Z23 Encounter for immunization: Secondary | ICD-10-CM | POA: Diagnosis not present

## 2023-11-29 DIAGNOSIS — Z961 Presence of intraocular lens: Secondary | ICD-10-CM | POA: Diagnosis not present

## 2023-11-29 DIAGNOSIS — H524 Presbyopia: Secondary | ICD-10-CM | POA: Diagnosis not present

## 2023-12-02 ENCOUNTER — Ambulatory Visit: Admitting: Orthopedic Surgery

## 2023-12-02 DIAGNOSIS — I87333 Chronic venous hypertension (idiopathic) with ulcer and inflammation of bilateral lower extremity: Secondary | ICD-10-CM | POA: Diagnosis not present

## 2023-12-03 ENCOUNTER — Encounter: Payer: Self-pay | Admitting: Orthopedic Surgery

## 2023-12-03 NOTE — Progress Notes (Signed)
 Office Visit Note   Patient: Anna York           Date of Birth: 1940-12-28           MRN: [id number] Visit Date: 12/02/2023              Requested by: Shayne Anes, MD [address] [place],  [place] [address] PCP: Shayne Anes, MD  Chief Complaint  Patient presents with   Left Leg - Wound Check      HPI: Discussed the use of AI scribe software for clinical note transcription with the patient, who gave verbal consent to proceed.  History of Present Illness       Assessment & Plan: Visit Diagnoses:  1. Chronic venous hypertension (idiopathic) with ulcer and inflammation of bilateral lower extremity (HCC)     Plan: Assessment and Plan Assessment & Plan        Follow-Up Instructions: Return if symptoms worsen or fail to improve.   Ortho Exam  Patient is alert, oriented, no adenopathy, well-dressed, normal affect, normal respiratory effort. Physical Exam  On physical exam the ulcer has completely healed there is some hyperpigmentation no cellulitis no drainage there is complete epithelialization.  The scab is removed.  There are varicose veins.      Imaging: No results found.   Labs: Lab Results  Component Value Date   ESRSEDRATE 13 11/01/2009   LABORGA NO GROWTH 12/24/2013     Lab Results  Component Value Date   ALBUMIN 3.7 09/30/2023   ALBUMIN 4.3 01/21/2018   ALBUMIN 4.4 09/11/2017    No results found for: MG No results found for: VD25OH  No results found for: PREALBUMIN    Latest Ref Rng & Units 09/30/2023    6:35 AM 04/24/2023    4:13 PM 04/24/2023    4:00 PM  CBC EXTENDED  WBC 4.0 - 10.5 K/uL 6.9  6.5  CANCELED   RBC 3.87 - 5.11 MIL/uL 4.10  4.21    Hemoglobin 12.0 - 15.0 g/dL 86.8  86.5    HCT 63.9 - 46.0 % 39.9  40.9    Platelets 150 - 400 K/uL 276  301.0    NEUT# 1.4 - 7.7 K/uL  4.8    Lymph# 0.7 - 4.0 K/uL  0.9       There is no height or weight on file to calculate BMI.  Orders:  No orders of the defined  types were placed in this encounter.  No orders of the defined types were placed in this encounter.    Procedures: No procedures performed  Clinical Data: No additional findings.  ROS:  All other systems negative, except as noted in the HPI. Review of Systems  Objective: Vital Signs: There were no vitals taken for this visit.  Specialty Comments:  No specialty comments available.  PMFS History: 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   S/P laminectomy 09/30/2023   S/P lumbar laminectomy 09/30/2023   S/P total knee arthroplasty, right 05/22/2022   Abnormal CT scan, lung 11/20/2021   Dyspnea on exertion 11/12/2016   Malignant neoplasm of upper-outer quadrant of right breast in female, estrogen receptor positive (HCC) 07/25/2016   Palate abnormality 05/17/2015   COPD (chronic obstructive pulmonary disease) (HCC) 10/04/2011   Atrophic vaginitis    Osteoporosis    ASCUS (atypical squamous cells of undetermined significance) on Pap smear    Cancer (HCC)    COLITIS 01/05/2010   DIARRHEA  11/01/2009   TIA 10/28/2009   CONSTIPATION 10/28/2009   CYSTITIS 10/28/2009   OSTEOARTHRITIS 10/28/2009   BACK PAIN 10/28/2009   OSTEOPENIA 10/28/2009   HEADACHE 10/28/2009   PALPITATIONS 10/28/2009   OTHER DYSPHAGIA 10/28/2009   NEOPLASM, MALIGNANT, BREAST, HX OF 10/28/2009   History of colonic polyps 10/28/2009   Past Medical History:  Diagnosis Date   ASCUS (atypical squamous cells of undetermined significance) on Pap smear    Neg high risk HPV   Asthma    Atrophic vaginitis    Backache, unspecified    Breast cancer (HCC) 1992   Chemo and Radiation   Constipation    COPD (chronic obstructive pulmonary disease) (HCC)    Cystitis    hx of frequent cystitis per pt at preop on 05/09/22.   Cystitis, unspecified    Diverticulosis    Dysphagia    Dyspnea    with exertion   GERD (gastroesophageal reflux disease)     indigestion   Heart murmur    patient denies but it is noted in several visit notes   Hypertension    Intermediate stage nonexudative age-related macular degeneration of left eye    Lymphocytic colitis    Memory loss    Mild   Osteoarthritis    Osteopenia 2012   T score -2.1   Osteoporosis    Palpitations    Personal history of colonic polyps 07/07/2002   hyperplastic   Personal history of malignant neoplasm of breast    Pneumonia    Stroke (HCC) 2010   2 TIA's    TIA (transient ischemic attack)    Unspecified transient cerebral ischemia     Family History  Problem Relation Age of Onset   Breast cancer Mother        Age 56   Hypertension Father     Past Surgical History:  Procedure Laterality Date   BREAST BIOPSY Left    biopsy   BREAST LUMPECTOMY Right    chemo and radiation   BREAST LUMPECTOMY WITH RADIOACTIVE SEED LOCALIZATION Right 08/09/2016   Procedure: RIGHT BREAST LUMPECTOMY WITH RADIOACTIVE SEED LOCALIZATION;  Surgeon: Ethyl Lenis, MD;  Location: [facility] OR;  Service: General;  Laterality: Right;   CARDIAC CATHETERIZATION     CATARACT EXTRACTION  2014   COLONOSCOPY  2011   diverticulosis   COLPOSCOPY     EYE SURGERY Bilateral    cataract extraction   FACIAL COSMETIC SURGERY     under both eyes   LUMBAR LAMINECTOMY FOR EPIDURAL ABSCESS Left 09/30/2023   Procedure: LUMBAR LAMINECTOMY FOR FACET/SYNOVIAL CYST LEFT LUMBAR FOUR-FIVE;  Surgeon: Lanis Pupa, MD;  Location: [facility] OR;  Service: Neurosurgery;  Laterality: Left;   LYMPHADENECTOMY  1993   REVERSE SHOULDER ARTHROPLASTY Right 03/14/2023   Procedure: REVERSE SHOULDER ARTHROPLASTY;  Surgeon: Melita Drivers, MD;  Location: [facility];  Service: Orthopedics;  Laterality: Right;  120 min   TONSILLECTOMY     age 65   TOTAL KNEE ARTHROPLASTY Right 05/22/2022   Procedure: TOTAL KNEE ARTHROPLASTY;  Surgeon: Ernie Cough, MD;  Location: [facility];  Service: Orthopedics;  Laterality: Right;   Social History    Occupational History   Occupation: Veterinary surgeon    Comment: [employer]    Employer: [employer]  Tobacco Use   Smoking status: Former    Current packs/day: 0.00    Average packs/day: 1 pack/day for 30.0 years (30.0 ttl pk-yrs)    Types: Cigarettes    Start date: 02/19/1970    Quit  date: 02/20/2000    Years since quitting: 23.8   Smokeless tobacco: Never  Vaping Use   Vaping status: Never Used  Substance and Sexual Activity   Alcohol  use: Yes    Alcohol /week: 14.0 standard drinks of alcohol     Types: 14 Glasses of wine per week    Comment: 3 glsses wine daily   Drug use: No   Sexual activity: Not Currently    Birth control/protection: Post-menopausal    Comment: 1st intercourse 83 yo-Fewer than 5 partners

## 2023-12-04 ENCOUNTER — Other Ambulatory Visit: Payer: Self-pay | Admitting: Thoracic Surgery (Cardiothoracic Vascular Surgery)

## 2023-12-04 DIAGNOSIS — R918 Other nonspecific abnormal finding of lung field: Secondary | ICD-10-CM

## 2023-12-06 ENCOUNTER — Ambulatory Visit (HOSPITAL_COMMUNITY)
Admission: RE | Admit: 2023-12-06 | Discharge: 2023-12-06 | Disposition: A | Source: Ambulatory Visit | Attending: Thoracic Surgery (Cardiothoracic Vascular Surgery) | Admitting: Thoracic Surgery (Cardiothoracic Vascular Surgery)

## 2023-12-06 DIAGNOSIS — R918 Other nonspecific abnormal finding of lung field: Secondary | ICD-10-CM | POA: Diagnosis not present

## 2023-12-06 DIAGNOSIS — J439 Emphysema, unspecified: Secondary | ICD-10-CM | POA: Insufficient documentation

## 2023-12-06 DIAGNOSIS — I7121 Aneurysm of the ascending aorta, without rupture: Secondary | ICD-10-CM | POA: Insufficient documentation

## 2023-12-06 DIAGNOSIS — I700 Atherosclerosis of aorta: Secondary | ICD-10-CM | POA: Diagnosis not present

## 2023-12-10 ENCOUNTER — Ambulatory Visit: Admitting: Thoracic Surgery (Cardiothoracic Vascular Surgery)

## 2023-12-10 ENCOUNTER — Ambulatory Visit
Attending: Thoracic Surgery (Cardiothoracic Vascular Surgery) | Admitting: Thoracic Surgery (Cardiothoracic Vascular Surgery)

## 2023-12-10 ENCOUNTER — Encounter: Payer: Self-pay | Admitting: Thoracic Surgery (Cardiothoracic Vascular Surgery)

## 2023-12-10 VITALS — BP 152/72 | HR 88 | Resp 20 | Ht 64.0 in | Wt 111.2 lb

## 2023-12-10 DIAGNOSIS — R918 Other nonspecific abnormal finding of lung field: Secondary | ICD-10-CM | POA: Diagnosis not present

## 2023-12-10 NOTE — Progress Notes (Signed)
 [address] [place] [address]             [phone]     HPI: [name] returns for a scheduled follow-up regarding multiple abnormalities of her lung on CT.  Anna York is an 83 year old woman with a history of tobacco use, COPD, multiple pneumonias, breast cancer, TIA, reflux, osteopenia, osteoarthritis, ascending aneurysm, and multiple pulmonary nodules.  She had pneumonia in 2023.  A CT showed right hilar soft tissue opacity and extensive left lower lobe consolidation.  She had a follow-up with a PET/CT which showed clearing of the left lower lobe but persistent consolidation in the right upper lobe.  There is also area of complex architectural distortion in the right lower lobe that was not metabolically active.  She has been followed since then.  I last saw her in April 2025.  Her aneurysm was stable at 4.3 cm and all of her pulmonary findings were stable as well.  In the interim since her last visit she just had spinal decompression for an epidural hematoma.  She is recovering well from that.  She is also followed by Dr. Harden for a chronic lower extremity wound.  No respiratory issues in the interval since her last visit.  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   S/P laminectomy 09/30/2023   S/P lumbar laminectomy 09/30/2023   S/P total knee arthroplasty, right 05/22/2022   Abnormal CT scan, lung 11/20/2021   Dyspnea on exertion 11/12/2016   Malignant neoplasm of upper-outer quadrant of right breast in female, estrogen receptor positive (HCC) 07/25/2016   Palate abnormality 05/17/2015   COPD (chronic obstructive pulmonary disease) (HCC) 10/04/2011   Atrophic vaginitis    Osteoporosis    ASCUS (atypical squamous cells of undetermined significance) on Pap smear    Cancer (HCC)    COLITIS 01/05/2010   DIARRHEA 11/01/2009   TIA 10/28/2009   CONSTIPATION 10/28/2009   CYSTITIS  10/28/2009   OSTEOARTHRITIS 10/28/2009   BACK PAIN 10/28/2009   OSTEOPENIA 10/28/2009   HEADACHE 10/28/2009   PALPITATIONS 10/28/2009   OTHER DYSPHAGIA 10/28/2009   NEOPLASM, MALIGNANT, BREAST, HX OF 10/28/2009   History of colonic polyps 10/28/2009    Current Outpatient Medications  Medication Sig Dispense Refill   acetaminophen  (TYLENOL ) 500 MG tablet Take 500-1,000 mg by mouth every 6 (six) hours as needed for headache (pain.).     albuterol  (VENTOLIN  HFA) 108 (90 Base) MCG/ACT inhaler Inhale 2 puffs into the lungs every 6 (six) hours as needed. 18 g 2   clopidogrel  (PLAVIX ) 75 MG tablet Take 75 mg by mouth in the morning.     cyclobenzaprine  (FLEXERIL ) 10 MG tablet Take 1 tablet (10 mg total) by mouth 3 (three) times daily as needed for muscle spasms. 30 tablet 0   Fluticasone -Umeclidin-Vilant (TRELEGY ELLIPTA ) 100-62.5-25 MCG/ACT AEPB INHALE ONE PUFF INTO THE LUNGS DAILY 60 each 11   Fluticasone -Umeclidin-Vilant (TRELEGY ELLIPTA ) 200-62.5-25 MCG/ACT AEPB Inhale 1 puff into the lungs daily.     ibandronate (BONIVA) 150 MG tablet Take 150 mg by mouth every 30 (thirty) days. Take in the morning with a full glass of water , on an empty stomach, and do not take anything else by mouth or lie down for the next 30 min.     metoprolol  succinate (TOPROL -XL) 25 MG 24 hr tablet Take 25 mg by mouth in the morning.     pravastatin  (PRAVACHOL ) 20 MG  tablet Take 20 mg by mouth in the morning.     rosuvastatin  (CRESTOR ) 5 MG tablet Take 5 mg by mouth in the morning.     No current facility-administered medications for this visit.    Physical Exam BP (!) 152/72 (BP Location: Left Arm, Patient Position: Sitting, Cuff Size: Small)   Pulse 88   Resp 20   Ht 5' 4 (1.626 m)   Wt 111 lb 3.2 oz (50.4 kg)   SpO2 95% Comment: RA  BMI 19.50 kg/m  83 year old woman in no acute distress Alert and oriented x 3 with no focal deficits Lungs diminished breath sounds bilaterally, no rales or  wheezing Cardiac regular rate and rhythm No cervical or supraclavicular adenopathy  Diagnostic Tests: CT CHEST WITHOUT CONTRAST   TECHNIQUE: Multidetector CT imaging of the chest was performed following the standard protocol without IV contrast.   RADIATION DOSE REDUCTION: This exam was performed according to the departmental dose-optimization program which includes automated exposure control, adjustment of the mA and/or kV according to patient size and/or use of iterative reconstruction technique.   COMPARISON:  Multiple prior imaging studies. The most recent chest CT is 06/04/2023   FINDINGS: Cardiovascular: The heart is normal in size. No pericardial effusion. Stable fusiform aneurysmal dilatation of the ascending thoracic aorta and stable aortic and branch vessel calcifications. No significant coronary artery calcifications.   Mediastinum/Nodes: Small scattered mediastinal and hilar lymph nodes are stable. No new or progressive findings. The esophagus is unremarkable.   Lungs/Pleura: Stable underlying severe emphysematous changes and pulmonary scarring.   Overall stable appearing complex cavitary process in the right lower lobe. This is difficult to measure accurately but measures approximately 4.7 x 3.8 cm on image 62/302 and previously measured approximately 4.2 x 3.4 cm.   Stable right upper lobe scarring changes and 2 adjacent nodules. The larger more caudal nodule measures 14 mm and the smaller more cephalad nodule measures 8 mm. No change.   Stable 6 mm ground-glass nodule in the right lower lobe on image number 95/302.   Stable 5 mm subpleural nodule in the left lower lobe on image number 111/302.   Stable elongated 5 mm perifissural nodule in the lingula, likely benign lymph node.   Stable 3 mm nodule in the right middle lobe on image number 102/302.   Upper Abdomen: No significant upper abdominal findings. Stable aortic calcifications. Stable upper  pole right renal cyst.   Musculoskeletal: No significant bony findings. Stable scoliosis and degenerative changes the spine.   IMPRESSION: 1. Overall stable appearing complex cavitary process in the right lower lobe. It may be slightly larger but no increasing soft tissue density. Recommend continued surveillance. 2. Stable right upper lobe scarring changes and 2 adjacent nodules. 3. Stable bilateral pulmonary nodules. 4. Stable severe emphysematous changes and pulmonary scarring. 5. Stable fusiform aneurysmal dilatation of the ascending thoracic aorta.   Aortic Atherosclerosis (ICD10-I70.0) and Emphysema (ICD10-J43.9).     Electronically Signed   By: MYRTIS Stammer M.D.   On: 12/06/2023 14:43 I personally reviewed the CT images.  Upper lobe nodular opacities appear flat and scarlike on coronal and sagittal images.  I do not see any significant change in the complex cavitary process in the right lower lobe.  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  Impression: Anna York is an 83 year old woman with a history of tobacco use, COPD, multiple pneumonias, breast cancer, TIA, reflux, osteopenia, osteoarthritis, ascending aneurysm, and multiple pulmonary nodules.  Right upper lobe lung nodules-appears very nodular on  axial imaging but on coronal and sagittal much more consistent with scarring.  Unchanged.  Will continue to follow radiographically.  Right lower lobe-large area of architectural distortion.  I do not see any significant change when compared closely side-by-side to her film from April.  We continue to follow radiographically.  Ascending aneurysm-stable around 4.2 to 4.3 cm.  Needs continued follow-up.  Hypertension-blood pressure elevated.  She is only on metoprolol .  Will defer to Dr. Shayne for BP management.  Plan: Follow-up with Dr. Shayne regarding blood pressure Return in 6 months with CT chest to follow-up aneurysm and pulmonary abnormalities.  Elspeth JAYSON Millers,  MD [facility] [phone]

## 2023-12-11 DIAGNOSIS — H353221 Exudative age-related macular degeneration, left eye, with active choroidal neovascularization: Secondary | ICD-10-CM | POA: Diagnosis not present

## 2023-12-11 DIAGNOSIS — H35363 Drusen (degenerative) of macula, bilateral: Secondary | ICD-10-CM | POA: Diagnosis not present

## 2023-12-11 DIAGNOSIS — H35722 Serous detachment of retinal pigment epithelium, left eye: Secondary | ICD-10-CM | POA: Diagnosis not present

## 2023-12-11 DIAGNOSIS — H353111 Nonexudative age-related macular degeneration, right eye, early dry stage: Secondary | ICD-10-CM | POA: Diagnosis not present

## 2024-02-26 NOTE — Progress Notes (Signed)
 Anna York                                          MRN: [id number]   02/26/2024   The [facility] Quality Team Specialist reviewed this patient medical record for the purposes of chart review for care gap closure. The following were reviewed: chart review for care gap closure-controlling blood pressure.    [facility] Quality Team

## 2024-03-02 ENCOUNTER — Other Ambulatory Visit (HOSPITAL_COMMUNITY): Payer: Self-pay | Admitting: Nurse Practitioner

## 2024-03-02 DIAGNOSIS — R413 Other amnesia: Secondary | ICD-10-CM

## 2024-03-05 ENCOUNTER — Ambulatory Visit (HOSPITAL_COMMUNITY)
Admission: RE | Admit: 2024-03-05 | Discharge: 2024-03-05 | Disposition: A | Discharge status: Home / Self Care | Source: Ambulatory Visit | Attending: Nurse Practitioner | Admitting: Nurse Practitioner

## 2024-03-05 DIAGNOSIS — R413 Other amnesia: Secondary | ICD-10-CM | POA: Insufficient documentation

## 2024-05-08 ENCOUNTER — Encounter (HOSPITAL_COMMUNITY)

## 2024-07-23 ENCOUNTER — Ambulatory Visit: Admitting: Neurology
